# Patient Record
Sex: Male | Born: 1960 | Race: White | Hispanic: No | Smoking: Former smoker
Health system: Southern US, Community
[De-identification: ages and names within clinical notes are randomized; demographics above are authoritative.]

## PROBLEM LIST (undated history)

## (undated) ENCOUNTER — Emergency Department: Admission: EM | Payer: Medicare Other | Source: Home / Self Care

## (undated) DIAGNOSIS — M1990 Unspecified osteoarthritis, unspecified site: Secondary | ICD-10-CM

## (undated) DIAGNOSIS — K219 Gastro-esophageal reflux disease without esophagitis: Secondary | ICD-10-CM

## (undated) DIAGNOSIS — G839 Paralytic syndrome, unspecified: Secondary | ICD-10-CM

## (undated) DIAGNOSIS — F32A Depression, unspecified: Secondary | ICD-10-CM

## (undated) DIAGNOSIS — A4902 Methicillin resistant Staphylococcus aureus infection, unspecified site: Secondary | ICD-10-CM

## (undated) DIAGNOSIS — D649 Anemia, unspecified: Secondary | ICD-10-CM

## (undated) DIAGNOSIS — G8220 Paraplegia, unspecified: Secondary | ICD-10-CM

## (undated) DIAGNOSIS — M869 Osteomyelitis, unspecified: Secondary | ICD-10-CM

## (undated) DIAGNOSIS — L89319 Pressure ulcer of right buttock, unspecified stage: Secondary | ICD-10-CM

## (undated) DIAGNOSIS — G709 Myoneural disorder, unspecified: Secondary | ICD-10-CM

## (undated) DIAGNOSIS — Z5189 Encounter for other specified aftercare: Secondary | ICD-10-CM

## (undated) DIAGNOSIS — F329 Major depressive disorder, single episode, unspecified: Secondary | ICD-10-CM

## (undated) DIAGNOSIS — F419 Anxiety disorder, unspecified: Secondary | ICD-10-CM

## (undated) DIAGNOSIS — B1920 Unspecified viral hepatitis C without hepatic coma: Secondary | ICD-10-CM

## (undated) DIAGNOSIS — F1910 Other psychoactive substance abuse, uncomplicated: Secondary | ICD-10-CM

## (undated) HISTORY — DX: Unspecified osteoarthritis, unspecified site: M19.90

## (undated) HISTORY — DX: Anemia, unspecified: D64.9

## (undated) HISTORY — DX: Paraplegia, unspecified: G82.20

## (undated) HISTORY — PX: HERNIA REPAIR: SHX51

## (undated) HISTORY — DX: Depression, unspecified: F32.A

## (undated) HISTORY — DX: Major depressive disorder, single episode, unspecified: F32.9

## (undated) HISTORY — PX: BACK SURGERY: SHX140

## (undated) HISTORY — PX: FRACTURE SURGERY: SHX138

## (undated) HISTORY — PX: CHOLECYSTECTOMY: SHX55

## (undated) HISTORY — DX: Other psychoactive substance abuse, uncomplicated: F19.10

## (undated) HISTORY — DX: Myoneural disorder, unspecified: G70.9

## (undated) HISTORY — DX: Encounter for other specified aftercare: Z51.89

## (undated) HISTORY — DX: Anxiety disorder, unspecified: F41.9

---

## 2003-12-12 ENCOUNTER — Emergency Department (HOSPITAL_COMMUNITY): Admission: EM | Admit: 2003-12-12 | Discharge: 2003-12-12 | Payer: Self-pay | Admitting: Emergency Medicine

## 2003-12-23 ENCOUNTER — Encounter: Payer: Self-pay | Admitting: Family Medicine

## 2004-01-07 ENCOUNTER — Encounter: Payer: Self-pay | Admitting: Family Medicine

## 2004-02-08 ENCOUNTER — Emergency Department (HOSPITAL_COMMUNITY): Admission: EM | Admit: 2004-02-08 | Discharge: 2004-02-08 | Payer: Self-pay | Admitting: Emergency Medicine

## 2004-02-18 ENCOUNTER — Encounter (HOSPITAL_COMMUNITY): Admission: RE | Admit: 2004-02-18 | Discharge: 2004-03-19 | Payer: Self-pay | Admitting: Occupational Therapy

## 2004-02-24 ENCOUNTER — Ambulatory Visit: Payer: Self-pay | Admitting: Pain Medicine

## 2004-04-20 ENCOUNTER — Emergency Department (HOSPITAL_COMMUNITY): Admission: EM | Admit: 2004-04-20 | Discharge: 2004-04-20 | Payer: Self-pay | Admitting: Emergency Medicine

## 2004-05-28 ENCOUNTER — Ambulatory Visit: Payer: Self-pay | Admitting: Pain Medicine

## 2004-07-01 ENCOUNTER — Ambulatory Visit: Payer: Self-pay | Admitting: Pain Medicine

## 2006-11-18 ENCOUNTER — Ambulatory Visit: Admission: RE | Admit: 2006-11-18 | Discharge: 2006-11-18 | Payer: Self-pay | Admitting: Family Medicine

## 2007-03-18 ENCOUNTER — Emergency Department (HOSPITAL_COMMUNITY): Admission: EM | Admit: 2007-03-18 | Discharge: 2007-03-18 | Payer: Self-pay | Admitting: Emergency Medicine

## 2007-03-18 ENCOUNTER — Encounter: Payer: Self-pay | Admitting: Orthopedic Surgery

## 2007-03-22 ENCOUNTER — Ambulatory Visit: Payer: Self-pay | Admitting: Orthopedic Surgery

## 2007-03-22 DIAGNOSIS — S82209A Unspecified fracture of shaft of unspecified tibia, initial encounter for closed fracture: Secondary | ICD-10-CM | POA: Insufficient documentation

## 2007-04-24 ENCOUNTER — Ambulatory Visit: Payer: Self-pay | Admitting: Orthopedic Surgery

## 2007-06-05 ENCOUNTER — Ambulatory Visit: Payer: Self-pay | Admitting: Orthopedic Surgery

## 2007-07-18 ENCOUNTER — Encounter: Payer: Self-pay | Admitting: Orthopedic Surgery

## 2007-07-18 ENCOUNTER — Emergency Department (HOSPITAL_COMMUNITY): Admission: EM | Admit: 2007-07-18 | Discharge: 2007-07-18 | Payer: Self-pay | Admitting: Emergency Medicine

## 2007-08-02 ENCOUNTER — Ambulatory Visit: Payer: Self-pay | Admitting: Orthopedic Surgery

## 2007-09-08 ENCOUNTER — Emergency Department (HOSPITAL_COMMUNITY): Admission: EM | Admit: 2007-09-08 | Discharge: 2007-09-08 | Payer: Self-pay | Admitting: Emergency Medicine

## 2007-09-27 ENCOUNTER — Emergency Department (HOSPITAL_COMMUNITY): Admission: EM | Admit: 2007-09-27 | Discharge: 2007-09-28 | Payer: Self-pay | Admitting: Emergency Medicine

## 2007-10-04 ENCOUNTER — Ambulatory Visit: Payer: Self-pay | Admitting: Orthopedic Surgery

## 2007-12-28 ENCOUNTER — Ambulatory Visit (HOSPITAL_COMMUNITY): Admission: RE | Admit: 2007-12-28 | Discharge: 2007-12-28 | Payer: Self-pay | Admitting: Family Medicine

## 2008-01-29 ENCOUNTER — Ambulatory Visit: Payer: Self-pay | Admitting: Orthopedic Surgery

## 2008-03-02 ENCOUNTER — Inpatient Hospital Stay (HOSPITAL_COMMUNITY): Admission: EM | Admit: 2008-03-02 | Discharge: 2008-03-12 | Payer: Self-pay | Admitting: Emergency Medicine

## 2008-03-04 ENCOUNTER — Ambulatory Visit: Payer: Self-pay | Admitting: Internal Medicine

## 2008-03-05 ENCOUNTER — Encounter (INDEPENDENT_AMBULATORY_CARE_PROVIDER_SITE_OTHER): Payer: Self-pay | Admitting: General Surgery

## 2008-03-06 ENCOUNTER — Ambulatory Visit: Payer: Self-pay | Admitting: Internal Medicine

## 2008-03-07 ENCOUNTER — Ambulatory Visit: Payer: Self-pay | Admitting: Internal Medicine

## 2008-03-08 HISTORY — PX: GALLBLADDER SURGERY: SHX652

## 2008-05-01 ENCOUNTER — Ambulatory Visit: Payer: Self-pay | Admitting: Orthopedic Surgery

## 2008-05-03 ENCOUNTER — Ambulatory Visit (HOSPITAL_COMMUNITY): Admission: RE | Admit: 2008-05-03 | Discharge: 2008-05-03 | Payer: Self-pay | Admitting: General Surgery

## 2008-05-07 ENCOUNTER — Ambulatory Visit (HOSPITAL_COMMUNITY): Admission: RE | Admit: 2008-05-07 | Discharge: 2008-05-07 | Payer: Self-pay | Admitting: General Surgery

## 2008-05-16 ENCOUNTER — Ambulatory Visit: Payer: Self-pay | Admitting: Internal Medicine

## 2008-05-17 ENCOUNTER — Encounter: Payer: Self-pay | Admitting: Internal Medicine

## 2008-05-17 ENCOUNTER — Encounter: Payer: Self-pay | Admitting: Urgent Care

## 2008-05-21 ENCOUNTER — Encounter (HOSPITAL_COMMUNITY): Admission: RE | Admit: 2008-05-21 | Discharge: 2008-06-20 | Payer: Self-pay | Admitting: Internal Medicine

## 2008-05-22 ENCOUNTER — Encounter: Payer: Self-pay | Admitting: Internal Medicine

## 2008-05-22 DIAGNOSIS — K8050 Calculus of bile duct without cholangitis or cholecystitis without obstruction: Secondary | ICD-10-CM | POA: Insufficient documentation

## 2008-05-30 ENCOUNTER — Ambulatory Visit (HOSPITAL_COMMUNITY): Admission: RE | Admit: 2008-05-30 | Discharge: 2008-05-30 | Payer: Self-pay | Admitting: Internal Medicine

## 2008-05-30 ENCOUNTER — Ambulatory Visit: Payer: Self-pay | Admitting: Internal Medicine

## 2008-06-15 ENCOUNTER — Emergency Department (HOSPITAL_COMMUNITY): Admission: EM | Admit: 2008-06-15 | Discharge: 2008-06-15 | Payer: Self-pay | Admitting: Emergency Medicine

## 2008-06-18 ENCOUNTER — Emergency Department (HOSPITAL_COMMUNITY): Admission: EM | Admit: 2008-06-18 | Discharge: 2008-06-18 | Payer: Self-pay | Admitting: Emergency Medicine

## 2008-06-25 ENCOUNTER — Ambulatory Visit (HOSPITAL_COMMUNITY): Admission: RE | Admit: 2008-06-25 | Discharge: 2008-06-25 | Payer: Self-pay | Admitting: Family Medicine

## 2008-08-18 ENCOUNTER — Inpatient Hospital Stay (HOSPITAL_COMMUNITY): Admission: EM | Admit: 2008-08-18 | Discharge: 2008-08-20 | Payer: Self-pay | Admitting: Emergency Medicine

## 2008-12-28 ENCOUNTER — Ambulatory Visit (HOSPITAL_COMMUNITY): Admission: RE | Admit: 2008-12-28 | Discharge: 2008-12-28 | Payer: Self-pay | Admitting: Family Medicine

## 2009-01-30 ENCOUNTER — Inpatient Hospital Stay (HOSPITAL_COMMUNITY): Admission: EM | Admit: 2009-01-30 | Discharge: 2009-02-05 | Payer: Self-pay | Admitting: Emergency Medicine

## 2009-09-01 ENCOUNTER — Inpatient Hospital Stay (HOSPITAL_COMMUNITY): Admission: EM | Admit: 2009-09-01 | Discharge: 2009-09-05 | Payer: Self-pay | Admitting: Emergency Medicine

## 2010-03-29 ENCOUNTER — Encounter: Payer: Self-pay | Admitting: Family Medicine

## 2010-03-29 ENCOUNTER — Encounter: Payer: Self-pay | Admitting: Internal Medicine

## 2010-05-07 HISTORY — PX: FEMUR FRACTURE SURGERY: SHX633

## 2010-05-18 ENCOUNTER — Emergency Department (HOSPITAL_COMMUNITY)
Admission: EM | Admit: 2010-05-18 | Discharge: 2010-05-18 | Disposition: A | Discharge status: Home / Self Care | Payer: Medicare Other | Attending: Emergency Medicine | Admitting: Emergency Medicine

## 2010-05-18 DIAGNOSIS — G5790 Unspecified mononeuropathy of unspecified lower limb: Secondary | ICD-10-CM | POA: Insufficient documentation

## 2010-05-18 DIAGNOSIS — Z760 Encounter for issue of repeat prescription: Secondary | ICD-10-CM | POA: Insufficient documentation

## 2010-05-18 DIAGNOSIS — G8929 Other chronic pain: Secondary | ICD-10-CM | POA: Insufficient documentation

## 2010-05-24 LAB — BASIC METABOLIC PANEL
BUN: 5 mg/dL — ABNORMAL LOW (ref 6–23)
Calcium: 8.1 mg/dL — ABNORMAL LOW (ref 8.4–10.5)
Calcium: 8.1 mg/dL — ABNORMAL LOW (ref 8.4–10.5)
Calcium: 8.3 mg/dL — ABNORMAL LOW (ref 8.4–10.5)
Chloride: 107 mEq/L (ref 96–112)
Creatinine, Ser: 0.64 mg/dL (ref 0.4–1.5)
Creatinine, Ser: 0.91 mg/dL (ref 0.4–1.5)
GFR calc Af Amer: >60 mL/min (ref >60)
GFR calc non Af Amer: >60 mL/min (ref >60)
GFR calc non Af Amer: >60 mL/min (ref >60)
GFR calc non Af Amer: >60 mL/min (ref >60)
Glucose, Bld: 104 mg/dL — ABNORMAL HIGH (ref 70–99)
Sodium: 138 mEq/L (ref 135–145)

## 2010-05-24 LAB — CBC
Hemoglobin: 8.5 g/dL — ABNORMAL LOW (ref 13.0–17.0)
MCHC: 32.2 g/dL (ref 30.0–36.0)
Platelets: 236 10*3/uL (ref 150–400)
Platelets: 229 10*3/uL (ref 150–400)
Platelets: 351 10*3/uL (ref 150–400)
RBC: 3.7 MIL/uL — ABNORMAL LOW (ref 4.22–5.81)
RBC: 4.2 MIL/uL — ABNORMAL LOW (ref 4.22–5.81)
RDW: 17.8 % — ABNORMAL HIGH (ref 11.5–15.5)
RDW: 17.8 % — ABNORMAL HIGH (ref 11.5–15.5)
RDW: 18.5 % — ABNORMAL HIGH (ref 11.5–15.5)
WBC: 5.3 10*3/uL (ref 4.0–10.5)
WBC: 13.4 10*3/uL — ABNORMAL HIGH (ref 4.0–10.5)
WBC: 7.7 10*3/uL (ref 4.0–10.5)
WBC: 8.1 10*3/uL (ref 4.0–10.5)

## 2010-05-24 LAB — DIFFERENTIAL
Basophils Absolute: 0 10*3/uL (ref 0.0–0.1)
Basophils Absolute: 0 10*3/uL (ref 0.0–0.1)
Basophils Relative: 0 % (ref 0–1)
Eosinophils Absolute: 0.1 10*3/uL (ref 0.0–0.7)
Eosinophils Relative: 8 % — ABNORMAL HIGH (ref 0–5)
Eosinophils Relative: 1 % (ref 0–5)
Lymphocytes Relative: 34 % (ref 12–46)
Lymphs Abs: 0.4 10*3/uL — ABNORMAL LOW (ref 0.7–4.0)
Monocytes Absolute: 0.4 10*3/uL (ref 0.1–1.0)
Monocytes Relative: 3 % (ref 3–12)
Monocytes Relative: 6 % (ref 3–12)
Neutro Abs: 2.6 10*3/uL (ref 1.7–7.7)
Neutro Abs: 5.8 10*3/uL (ref 1.7–7.7)
Neutrophils Relative %: 49 % (ref 43–77)
Neutrophils Relative %: 75 % (ref 43–77)
Neutrophils Relative %: 92 % — ABNORMAL HIGH (ref 43–77)

## 2010-05-24 LAB — URINALYSIS, ROUTINE W REFLEX MICROSCOPIC
Ketones, ur: NEGATIVE mg/dL
Nitrite: POSITIVE — AB
Specific Gravity, Urine: 1.02 (ref 1.005–1.030)
pH: 6 (ref 5.0–8.0)

## 2010-05-24 LAB — URINE CULTURE: Colony Count: >=100000

## 2010-05-24 LAB — COMPREHENSIVE METABOLIC PANEL
ALT: 19 U/L (ref 0–53)
AST: 29 U/L (ref 0–37)
Albumin: 2 g/dL — ABNORMAL LOW (ref 3.5–5.2)
Alkaline Phosphatase: 139 U/L — ABNORMAL HIGH (ref 39–117)
Chloride: 110 mEq/L (ref 96–112)
GFR calc Af Amer: >60 mL/min (ref >60)
Potassium: 4.2 mEq/L (ref 3.5–5.1)
Sodium: 137 mEq/L (ref 135–145)
Total Protein: 6.1 g/dL (ref 6.0–8.3)

## 2010-05-24 LAB — MAGNESIUM: Magnesium: 1.4 mg/dL — ABNORMAL LOW (ref 1.5–2.5)

## 2010-05-24 LAB — AMYLASE: Amylase: 56 U/L (ref 0–105)

## 2010-05-24 LAB — URINE MICROSCOPIC-ADD ON

## 2010-05-24 LAB — LACTIC ACID, PLASMA: Lactic Acid, Venous: 1.6 mmol/L (ref 0.5–2.2)

## 2010-05-24 LAB — MRSA PCR SCREENING: MRSA by PCR: POSITIVE — AB

## 2010-05-24 LAB — LIPASE, BLOOD: Lipase: 16 U/L (ref 11–59)

## 2010-05-24 LAB — CULTURE, BLOOD (ROUTINE X 2)

## 2010-05-24 LAB — TSH: TSH: 5.604 u[IU]/mL — ABNORMAL HIGH (ref 0.350–4.500)

## 2010-05-24 LAB — PROTIME-INR: INR: 1.51 — ABNORMAL HIGH (ref 0.00–1.49)

## 2010-05-24 LAB — APTT: aPTT: 37 seconds (ref 24–37)

## 2010-06-04 ENCOUNTER — Emergency Department (HOSPITAL_COMMUNITY): Payer: Medicare Other

## 2010-06-04 ENCOUNTER — Inpatient Hospital Stay (HOSPITAL_COMMUNITY)
Admission: EM | Admit: 2010-06-04 | Discharge: 2010-06-09 | DRG: 480 | Disposition: A | Discharge status: Home Health | Payer: Medicare Other | Attending: Orthopedic Surgery | Admitting: Orthopedic Surgery

## 2010-06-04 DIAGNOSIS — W050XXA Fall from non-moving wheelchair, initial encounter: Secondary | ICD-10-CM | POA: Diagnosis present

## 2010-06-04 DIAGNOSIS — D62 Acute posthemorrhagic anemia: Secondary | ICD-10-CM | POA: Diagnosis not present

## 2010-06-04 DIAGNOSIS — L8994 Pressure ulcer of unspecified site, stage 4: Secondary | ICD-10-CM | POA: Diagnosis present

## 2010-06-04 DIAGNOSIS — G8929 Other chronic pain: Secondary | ICD-10-CM | POA: Diagnosis present

## 2010-06-04 DIAGNOSIS — L89109 Pressure ulcer of unspecified part of back, unspecified stage: Secondary | ICD-10-CM | POA: Diagnosis present

## 2010-06-04 DIAGNOSIS — A498 Other bacterial infections of unspecified site: Secondary | ICD-10-CM | POA: Diagnosis present

## 2010-06-04 DIAGNOSIS — G8220 Paraplegia, unspecified: Secondary | ICD-10-CM | POA: Diagnosis present

## 2010-06-04 DIAGNOSIS — S72309A Unspecified fracture of shaft of unspecified femur, initial encounter for closed fracture: Secondary | ICD-10-CM

## 2010-06-04 DIAGNOSIS — N390 Urinary tract infection, site not specified: Secondary | ICD-10-CM | POA: Diagnosis present

## 2010-06-04 DIAGNOSIS — S72443A Displaced fracture of lower epiphysis (separation) of unspecified femur, initial encounter for closed fracture: Principal | ICD-10-CM | POA: Diagnosis present

## 2010-06-04 LAB — DIFFERENTIAL
Basophils Absolute: 0 10*3/uL (ref 0.0–0.1)
Basophils Relative: 0 % (ref 0–1)
Neutro Abs: 10.5 10*3/uL — ABNORMAL HIGH (ref 1.7–7.7)
Neutrophils Relative %: 78 % — ABNORMAL HIGH (ref 43–77)

## 2010-06-04 LAB — BASIC METABOLIC PANEL
Chloride: 104 mEq/L (ref 96–112)
Creatinine, Ser: 0.67 mg/dL (ref 0.4–1.5)
GFR calc Af Amer: >60 mL/min (ref >60)
Potassium: 3.5 mEq/L (ref 3.5–5.1)

## 2010-06-04 LAB — PROTIME-INR
INR: 1.01 (ref 0.00–1.49)
Prothrombin Time: 13.5 seconds (ref 11.6–15.2)

## 2010-06-04 LAB — URINALYSIS, ROUTINE W REFLEX MICROSCOPIC
Glucose, UA: NEGATIVE mg/dL
Protein, ur: NEGATIVE mg/dL
pH: 5 (ref 5.0–8.0)

## 2010-06-04 LAB — CBC
Hemoglobin: 12.7 g/dL — ABNORMAL LOW (ref 13.0–17.0)
RBC: 4.81 MIL/uL (ref 4.22–5.81)

## 2010-06-05 ENCOUNTER — Inpatient Hospital Stay (HOSPITAL_COMMUNITY): Payer: Medicare Other

## 2010-06-05 LAB — SURGICAL PCR SCREEN: Staphylococcus aureus: NEGATIVE

## 2010-06-06 LAB — DIFFERENTIAL
Basophils Absolute: 0 10*3/uL (ref 0.0–0.1)
Eosinophils Absolute: 0 10*3/uL (ref 0.0–0.7)
Eosinophils Relative: 1 % (ref 0–5)
Monocytes Absolute: 1.2 10*3/uL — ABNORMAL HIGH (ref 0.1–1.0)

## 2010-06-06 LAB — BASIC METABOLIC PANEL
BUN: 5 mg/dL — ABNORMAL LOW (ref 6–23)
CO2: 27 mEq/L (ref 19–32)
Calcium: 7.7 mg/dL — ABNORMAL LOW (ref 8.4–10.5)
GFR calc non Af Amer: >60 mL/min (ref >60)
Glucose, Bld: 133 mg/dL — ABNORMAL HIGH (ref 70–99)

## 2010-06-06 LAB — CBC
HCT: 23 % — ABNORMAL LOW (ref 39.0–52.0)
MCHC: 32.2 g/dL (ref 30.0–36.0)
MCV: 80.7 fL (ref 78.0–100.0)
Platelets: 222 10*3/uL (ref 150–400)
RDW: 17.3 % — ABNORMAL HIGH (ref 11.5–15.5)
WBC: 7.5 10*3/uL (ref 4.0–10.5)

## 2010-06-07 LAB — DIFFERENTIAL
Basophils Absolute: 0 10*3/uL (ref 0.0–0.1)
Eosinophils Relative: 1 % (ref 0–5)
Lymphocytes Relative: 24 % (ref 12–46)
Lymphs Abs: 2.1 10*3/uL (ref 0.7–4.0)
Monocytes Relative: 15 % — ABNORMAL HIGH (ref 3–12)

## 2010-06-07 LAB — BASIC METABOLIC PANEL
CO2: 26 mEq/L (ref 19–32)
Calcium: 7.4 mg/dL — ABNORMAL LOW (ref 8.4–10.5)
Chloride: 104 mEq/L (ref 96–112)
GFR calc Af Amer: >60 mL/min (ref >60)
Glucose, Bld: 111 mg/dL — ABNORMAL HIGH (ref 70–99)
Potassium: 3 mEq/L — ABNORMAL LOW (ref 3.5–5.1)
Sodium: 135 mEq/L (ref 135–145)

## 2010-06-07 LAB — CBC
HCT: 20 % — ABNORMAL LOW (ref 39.0–52.0)
Hemoglobin: 6.4 g/dL — CL (ref 13.0–17.0)
RBC: 2.46 MIL/uL — ABNORMAL LOW (ref 4.22–5.81)
WBC: 8.6 10*3/uL (ref 4.0–10.5)

## 2010-06-07 LAB — URINE CULTURE: Culture  Setup Time: 201203302017

## 2010-06-08 LAB — BASIC METABOLIC PANEL
BUN: 4 mg/dL — ABNORMAL LOW (ref 6–23)
CO2: 26 mEq/L (ref 19–32)
Calcium: 7.8 mg/dL — ABNORMAL LOW (ref 8.4–10.5)
Glucose, Bld: 107 mg/dL — ABNORMAL HIGH (ref 70–99)
Sodium: 137 mEq/L (ref 135–145)

## 2010-06-08 LAB — CBC
HCT: 26.2 % — ABNORMAL LOW (ref 39.0–52.0)
Hemoglobin: 8.7 g/dL — ABNORMAL LOW (ref 13.0–17.0)
MCHC: 33.2 g/dL (ref 30.0–36.0)
MCV: 80.4 fL (ref 78.0–100.0)

## 2010-06-08 LAB — DIFFERENTIAL
Basophils Absolute: 0 10*3/uL (ref 0.0–0.1)
Eosinophils Relative: 2 % (ref 0–5)
Lymphocytes Relative: 24 % (ref 12–46)
Lymphs Abs: 1.6 10*3/uL (ref 0.7–4.0)
Monocytes Absolute: 0.9 10*3/uL (ref 0.1–1.0)
Monocytes Relative: 14 % — ABNORMAL HIGH (ref 3–12)
Neutro Abs: 3.9 10*3/uL (ref 1.7–7.7)

## 2010-06-08 LAB — CROSSMATCH
Antibody Screen: NEGATIVE
Unit division: 0

## 2010-06-09 LAB — BUN: BUN: 5 mg/dL — ABNORMAL LOW (ref 6–23)

## 2010-06-09 LAB — POTASSIUM: Potassium: 4 mEq/L (ref 3.5–5.1)

## 2010-06-09 LAB — CREATININE, SERUM
GFR calc Af Amer: >60 mL/min (ref >60)
GFR calc non Af Amer: >60 mL/min (ref >60)

## 2010-06-10 LAB — DIFFERENTIAL
Basophils Absolute: 0 10*3/uL (ref 0.0–0.1)
Basophils Absolute: 0 10*3/uL (ref 0.0–0.1)
Basophils Absolute: 0.1 10*3/uL (ref 0.0–0.1)
Basophils Relative: 0 % (ref 0–1)
Basophils Relative: 0 % (ref 0–1)
Basophils Relative: 1 % (ref 0–1)
Basophils Relative: 1 % (ref 0–1)
Eosinophils Absolute: 0.1 10*3/uL (ref 0.0–0.7)
Eosinophils Absolute: 0.2 10*3/uL (ref 0.0–0.7)
Eosinophils Absolute: 0.3 10*3/uL (ref 0.0–0.7)
Eosinophils Relative: 1 % (ref 0–5)
Eosinophils Relative: 2 % (ref 0–5)
Lymphocytes Relative: 32 % (ref 12–46)
Lymphs Abs: 1.5 10*3/uL (ref 0.7–4.0)
Lymphs Abs: 2.1 10*3/uL (ref 0.7–4.0)
Monocytes Absolute: 0.7 10*3/uL (ref 0.1–1.0)
Neutro Abs: 3.8 10*3/uL (ref 1.7–7.7)
Neutrophils Relative %: 55 % (ref 43–77)
Neutrophils Relative %: 71 % (ref 43–77)
Neutrophils Relative %: 72 % (ref 43–77)

## 2010-06-10 LAB — VITAMIN B12: Vitamin B-12: 380 pg/mL (ref 211–911)

## 2010-06-10 LAB — CBC
HCT: 24.9 % — ABNORMAL LOW (ref 39.0–52.0)
HCT: 29.1 % — ABNORMAL LOW (ref 39.0–52.0)
Hemoglobin: 10.4 g/dL — ABNORMAL LOW (ref 13.0–17.0)
MCHC: 32.4 g/dL (ref 30.0–36.0)
MCHC: 32.8 g/dL (ref 30.0–36.0)
MCHC: 33 g/dL (ref 30.0–36.0)
MCV: 77.5 fL — ABNORMAL LOW (ref 78.0–100.0)
MCV: 79.2 fL (ref 78.0–100.0)
MCV: 80.4 fL (ref 78.0–100.0)
Platelets: 472 10*3/uL — ABNORMAL HIGH (ref 150–400)
Platelets: 476 10*3/uL — ABNORMAL HIGH (ref 150–400)
Platelets: 555 10*3/uL — ABNORMAL HIGH (ref 150–400)
RBC: 3.94 MIL/uL — ABNORMAL LOW (ref 4.22–5.81)
RDW: 18.6 % — ABNORMAL HIGH (ref 11.5–15.5)
RDW: 19.2 % — ABNORMAL HIGH (ref 11.5–15.5)
RDW: 19.4 % — ABNORMAL HIGH (ref 11.5–15.5)
RDW: 20.1 % — ABNORMAL HIGH (ref 11.5–15.5)
WBC: 12.2 10*3/uL — ABNORMAL HIGH (ref 4.0–10.5)

## 2010-06-10 LAB — BASIC METABOLIC PANEL
BUN: 4 mg/dL — ABNORMAL LOW (ref 6–23)
BUN: 8 mg/dL (ref 6–23)
CO2: 30 mEq/L (ref 19–32)
Calcium: 8.3 mg/dL — ABNORMAL LOW (ref 8.4–10.5)
Chloride: 105 mEq/L (ref 96–112)
Chloride: 99 mEq/L (ref 96–112)
Creatinine, Ser: 0.57 mg/dL (ref 0.4–1.5)
Creatinine, Ser: 0.62 mg/dL (ref 0.4–1.5)
GFR calc Af Amer: >60 mL/min (ref >60)
GFR calc non Af Amer: >60 mL/min (ref >60)
Glucose, Bld: 105 mg/dL — ABNORMAL HIGH (ref 70–99)
Glucose, Bld: 80 mg/dL (ref 70–99)
Potassium: 3.9 mEq/L (ref 3.5–5.1)
Sodium: 139 mEq/L (ref 135–145)

## 2010-06-10 LAB — URINALYSIS, ROUTINE W REFLEX MICROSCOPIC
Nitrite: NEGATIVE
Specific Gravity, Urine: 1.03 (ref 1.005–1.030)
Urobilinogen, UA: 2 mg/dL — ABNORMAL HIGH (ref 0.0–1.0)

## 2010-06-10 LAB — URINE CULTURE
Colony Count: NO GROWTH
Special Requests: NEGATIVE

## 2010-06-10 LAB — CROSSMATCH

## 2010-06-10 LAB — RETICULOCYTES
RBC.: 3.6 MIL/uL — ABNORMAL LOW (ref 4.22–5.81)
Retic Count, Absolute: 86.4 10*3/uL (ref 19.0–186.0)
Retic Ct Pct: 2.4 % (ref 0.4–3.1)

## 2010-06-10 LAB — IRON AND TIBC
Iron: 52 ug/dL (ref 42–135)
UIBC: 79 ug/dL

## 2010-06-15 LAB — CBC
HCT: 36.3 % — ABNORMAL LOW (ref 39.0–52.0)
Hemoglobin: 12.7 g/dL — ABNORMAL LOW (ref 13.0–17.0)
MCHC: 35 g/dL (ref 30.0–36.0)
MCV: 87.4 fL (ref 78.0–100.0)
Platelets: 252 10*3/uL (ref 150–400)
RDW: 15.2 % (ref 11.5–15.5)

## 2010-06-15 LAB — BASIC METABOLIC PANEL
BUN: 4 mg/dL — ABNORMAL LOW (ref 6–23)
BUN: 6 mg/dL (ref 6–23)
BUN: 8 mg/dL (ref 6–23)
CO2: 26 mEq/L (ref 19–32)
CO2: 28 mEq/L (ref 19–32)
Calcium: 7.9 mg/dL — ABNORMAL LOW (ref 8.4–10.5)
Chloride: 105 mEq/L (ref 96–112)
Chloride: 95 mEq/L — ABNORMAL LOW (ref 96–112)
Creatinine, Ser: 0.63 mg/dL (ref 0.4–1.5)
Creatinine, Ser: 0.75 mg/dL (ref 0.4–1.5)
GFR calc non Af Amer: >60 mL/min (ref >60)
Glucose, Bld: 116 mg/dL — ABNORMAL HIGH (ref 70–99)
Glucose, Bld: 95 mg/dL (ref 70–99)
Potassium: 2.6 mEq/L — CL (ref 3.5–5.1)
Potassium: 3.4 mEq/L — ABNORMAL LOW (ref 3.5–5.1)
Sodium: 132 mEq/L — ABNORMAL LOW (ref 135–145)

## 2010-06-15 LAB — DIFFERENTIAL
Band Neutrophils: 1 % (ref 0–10)
Blasts: 0 %
Metamyelocytes Relative: 0 %
Monocytes Absolute: 2.6 10*3/uL — ABNORMAL HIGH (ref 0.1–1.0)
Monocytes Relative: 16 % — ABNORMAL HIGH (ref 3–12)
Myelocytes: 0 %
Promyelocytes Absolute: 0 %
nRBC: 0 /100 WBC

## 2010-06-15 LAB — WOUND CULTURE

## 2010-06-15 LAB — CULTURE, BLOOD (ROUTINE X 2)
Culture: NO GROWTH
Report Status: 6182010

## 2010-06-16 ENCOUNTER — Ambulatory Visit: Payer: Medicare Other | Admitting: Orthopedic Surgery

## 2010-06-17 LAB — DIFFERENTIAL
Basophils Absolute: 0 10*3/uL (ref 0.0–0.1)
Basophils Relative: 0 % (ref 0–1)
Eosinophils Absolute: 0.1 10*3/uL (ref 0.0–0.7)
Neutrophils Relative %: 76 % (ref 43–77)

## 2010-06-17 LAB — CBC
MCHC: 34.6 g/dL (ref 30.0–36.0)
MCV: 84.7 fL (ref 78.0–100.0)
Platelets: 233 10*3/uL (ref 150–400)
WBC: 9.1 10*3/uL (ref 4.0–10.5)

## 2010-06-17 LAB — WOUND CULTURE: Gram Stain: NONE SEEN

## 2010-06-18 LAB — CBC
HCT: 41 % (ref 39.0–52.0)
MCHC: 34 g/dL (ref 30.0–36.0)
MCV: 84.8 fL (ref 78.0–100.0)
Platelets: 258 10*3/uL (ref 150–400)
RDW: 15.4 % (ref 11.5–15.5)
WBC: 5.7 10*3/uL (ref 4.0–10.5)

## 2010-06-18 LAB — BASIC METABOLIC PANEL
BUN: 7 mg/dL (ref 6–23)
CO2: 28 mEq/L (ref 19–32)
Chloride: 101 mEq/L (ref 96–112)
Glucose, Bld: 101 mg/dL — ABNORMAL HIGH (ref 70–99)
Potassium: 4.1 mEq/L (ref 3.5–5.1)

## 2010-06-18 LAB — HEPATIC FUNCTION PANEL
ALT: 62 U/L — ABNORMAL HIGH (ref 0–53)
Bilirubin, Direct: 0.3 mg/dL (ref 0.0–0.3)
Indirect Bilirubin: 0.2 mg/dL — ABNORMAL LOW (ref 0.3–0.9)

## 2010-06-19 ENCOUNTER — Encounter: Payer: Self-pay | Admitting: Orthopedic Surgery

## 2010-06-22 ENCOUNTER — Ambulatory Visit (INDEPENDENT_AMBULATORY_CARE_PROVIDER_SITE_OTHER): Payer: Medicare Other | Admitting: Orthopedic Surgery

## 2010-06-22 ENCOUNTER — Ambulatory Visit (HOSPITAL_COMMUNITY): Admission: RE | Admit: 2010-06-22 | Payer: Medicare Other | Source: Ambulatory Visit

## 2010-06-22 DIAGNOSIS — S7290XA Unspecified fracture of unspecified femur, initial encounter for closed fracture: Secondary | ICD-10-CM

## 2010-06-22 DIAGNOSIS — S7292XA Unspecified fracture of left femur, initial encounter for closed fracture: Secondary | ICD-10-CM

## 2010-06-22 LAB — HEPATIC FUNCTION PANEL
ALT: 25 U/L (ref 0–53)
AST: 30 U/L (ref 0–37)
Bilirubin, Direct: 1.4 mg/dL — ABNORMAL HIGH (ref 0.0–0.3)
Indirect Bilirubin: 1.4 mg/dL — ABNORMAL HIGH (ref 0.3–0.9)
Indirect Bilirubin: 1.4 mg/dL — ABNORMAL HIGH (ref 0.3–0.9)
Total Protein: 6.1 g/dL (ref 6.0–8.3)
Total Protein: 6.9 g/dL (ref 6.0–8.3)

## 2010-06-22 LAB — CBC
HCT: 26.7 % — ABNORMAL LOW (ref 39.0–52.0)
HCT: 31.6 % — ABNORMAL LOW (ref 39.0–52.0)
Hemoglobin: 8.8 g/dL — ABNORMAL LOW (ref 13.0–17.0)
MCHC: 33 g/dL (ref 30.0–36.0)
MCHC: 33.7 g/dL (ref 30.0–36.0)
MCV: 85.7 fL (ref 78.0–100.0)
MCV: 86 fL (ref 78.0–100.0)
MCV: 87.5 fL (ref 78.0–100.0)
MCV: 89.2 fL (ref 78.0–100.0)
Platelets: 408 10*3/uL — ABNORMAL HIGH (ref 150–400)
Platelets: 533 10*3/uL — ABNORMAL HIGH (ref 150–400)
Platelets: 687 10*3/uL — ABNORMAL HIGH (ref 150–400)
RBC: 3.11 MIL/uL — ABNORMAL LOW (ref 4.22–5.81)
RBC: 3.16 MIL/uL — ABNORMAL LOW (ref 4.22–5.81)
RDW: 14.3 % (ref 11.5–15.5)
RDW: 14.5 % (ref 11.5–15.5)
RDW: 14.7 % (ref 11.5–15.5)
WBC: 9.1 10*3/uL (ref 4.0–10.5)

## 2010-06-22 LAB — DIFFERENTIAL
Basophils Absolute: 0 10*3/uL (ref 0.0–0.1)
Basophils Absolute: 0 10*3/uL (ref 0.0–0.1)
Basophils Absolute: 0 10*3/uL (ref 0.0–0.1)
Basophils Absolute: 0 10*3/uL (ref 0.0–0.1)
Basophils Relative: 0 % (ref 0–1)
Basophils Relative: 0 % (ref 0–1)
Basophils Relative: 1 % (ref 0–1)
Eosinophils Absolute: 0.1 10*3/uL (ref 0.0–0.7)
Eosinophils Absolute: 0.1 10*3/uL (ref 0.0–0.7)
Eosinophils Absolute: 0.2 10*3/uL (ref 0.0–0.7)
Eosinophils Relative: 2 % (ref 0–5)
Eosinophils Relative: 2 % (ref 0–5)
Eosinophils Relative: 2 % (ref 0–5)
Lymphocytes Relative: 18 % (ref 12–46)
Lymphocytes Relative: 18 % (ref 12–46)
Lymphocytes Relative: 19 % (ref 12–46)
Monocytes Absolute: 0.6 10*3/uL (ref 0.1–1.0)
Monocytes Absolute: 0.6 10*3/uL (ref 0.1–1.0)
Monocytes Absolute: 0.8 10*3/uL (ref 0.1–1.0)
Monocytes Absolute: 1 10*3/uL (ref 0.1–1.0)
Monocytes Relative: 10 % (ref 3–12)
Monocytes Relative: 6 % (ref 3–12)
Neutro Abs: 6 10*3/uL (ref 1.7–7.7)
Neutro Abs: 6 10*3/uL (ref 1.7–7.7)
Neutrophils Relative %: 79 % — ABNORMAL HIGH (ref 43–77)

## 2010-06-22 LAB — COMPREHENSIVE METABOLIC PANEL
ALT: 26 U/L (ref 0–53)
AST: 30 U/L (ref 0–37)
Albumin: 1.6 g/dL — ABNORMAL LOW (ref 3.5–5.2)
Albumin: 1.8 g/dL — ABNORMAL LOW (ref 3.5–5.2)
Alkaline Phosphatase: 114 U/L (ref 39–117)
Alkaline Phosphatase: 145 U/L — ABNORMAL HIGH (ref 39–117)
BUN: 3 mg/dL — ABNORMAL LOW (ref 6–23)
BUN: 4 mg/dL — ABNORMAL LOW (ref 6–23)
CO2: 26 mEq/L (ref 19–32)
Chloride: 103 mEq/L (ref 96–112)
Creatinine, Ser: 0.47 mg/dL (ref 0.4–1.5)
Creatinine, Ser: 0.48 mg/dL (ref 0.4–1.5)
GFR calc Af Amer: >60 mL/min (ref >60)
GFR calc non Af Amer: >60 mL/min (ref >60)
Potassium: 3.6 mEq/L (ref 3.5–5.1)
Potassium: 4.2 mEq/L (ref 3.5–5.1)
Total Bilirubin: 3.1 mg/dL — ABNORMAL HIGH (ref 0.3–1.2)
Total Bilirubin: 4 mg/dL — ABNORMAL HIGH (ref 0.3–1.2)
Total Protein: 5.1 g/dL — ABNORMAL LOW (ref 6.0–8.3)
Total Protein: 5.6 g/dL — ABNORMAL LOW (ref 6.0–8.3)

## 2010-06-22 LAB — BASIC METABOLIC PANEL
BUN: 2 mg/dL — ABNORMAL LOW (ref 6–23)
BUN: 7 mg/dL (ref 6–23)
CO2: 27 mEq/L (ref 19–32)
Calcium: 8.3 mg/dL — ABNORMAL LOW (ref 8.4–10.5)
Creatinine, Ser: 0.71 mg/dL (ref 0.4–1.5)
GFR calc non Af Amer: >60 mL/min (ref >60)
GFR calc non Af Amer: >60 mL/min (ref >60)
Glucose, Bld: 113 mg/dL — ABNORMAL HIGH (ref 70–99)
Glucose, Bld: 127 mg/dL — ABNORMAL HIGH (ref 70–99)
Potassium: 4.1 mEq/L (ref 3.5–5.1)

## 2010-06-22 LAB — MAGNESIUM
Magnesium: 2.1 mg/dL (ref 1.5–2.5)
Magnesium: 2.4 mg/dL (ref 1.5–2.5)

## 2010-06-22 LAB — PHOSPHORUS
Phosphorus: 3.8 mg/dL (ref 2.3–4.6)
Phosphorus: 3.9 mg/dL (ref 2.3–4.6)
Phosphorus: 4 mg/dL (ref 2.3–4.6)

## 2010-06-22 NOTE — Progress Notes (Signed)
Addended by: Fuller Canada on: 06/22/2010 02:32 PM   Modules accepted: Orders

## 2010-06-22 NOTE — Discharge Summary (Signed)
Separate identifiable. X-ray report followup for fracture and internal fixation.  4 views of the LEFT femur are obtained.  Proximal and distally locked retrograde femoral nail is fixating a distal 3rd, middle 3rd junction femoral shaft fracture with alignment in acceptable position

## 2010-06-22 NOTE — Progress Notes (Signed)
Postoperative visit.  Postop visit #1 dose of a 17.  Procedure LEFT retrograde intramedullary nailing of the femur.  Smith & Nephew TriGen 13 mm x 40 cm nail locked proximally and distally.  X-rays today show alignment is good.  Incision looks good.  Major problem now is impaction.  Patient given magnesium citrate advised if no improvement to go to the hospital for disimpaction

## 2010-06-23 ENCOUNTER — Ambulatory Visit: Payer: Medicare Other | Admitting: Orthopedic Surgery

## 2010-06-28 ENCOUNTER — Emergency Department (HOSPITAL_COMMUNITY): Payer: Medicare Other

## 2010-06-28 ENCOUNTER — Emergency Department (HOSPITAL_COMMUNITY)
Admission: EM | Admit: 2010-06-28 | Discharge: 2010-06-28 | Disposition: A | Discharge status: Home / Self Care | Payer: Medicare Other | Attending: Emergency Medicine | Admitting: Emergency Medicine

## 2010-06-28 DIAGNOSIS — K279 Peptic ulcer, site unspecified, unspecified as acute or chronic, without hemorrhage or perforation: Secondary | ICD-10-CM | POA: Insufficient documentation

## 2010-06-28 DIAGNOSIS — R0602 Shortness of breath: Secondary | ICD-10-CM | POA: Insufficient documentation

## 2010-06-28 DIAGNOSIS — R1013 Epigastric pain: Secondary | ICD-10-CM | POA: Insufficient documentation

## 2010-06-28 DIAGNOSIS — K219 Gastro-esophageal reflux disease without esophagitis: Secondary | ICD-10-CM | POA: Insufficient documentation

## 2010-06-28 DIAGNOSIS — R748 Abnormal levels of other serum enzymes: Secondary | ICD-10-CM | POA: Insufficient documentation

## 2010-06-28 DIAGNOSIS — B1920 Unspecified viral hepatitis C without hepatic coma: Secondary | ICD-10-CM | POA: Insufficient documentation

## 2010-06-28 DIAGNOSIS — G8220 Paraplegia, unspecified: Secondary | ICD-10-CM | POA: Insufficient documentation

## 2010-06-28 DIAGNOSIS — R079 Chest pain, unspecified: Secondary | ICD-10-CM | POA: Insufficient documentation

## 2010-06-28 LAB — BRAIN NATRIURETIC PEPTIDE: Pro B Natriuretic peptide (BNP): 48.2 pg/mL (ref 0.0–100.0)

## 2010-06-28 LAB — POCT CARDIAC MARKERS
Myoglobin, poc: 46.3 ng/mL (ref 12–200)
Troponin i, poc: <0.05 ng/mL (ref 0.00–0.09)

## 2010-06-28 LAB — CBC
Hemoglobin: 12.4 g/dL — ABNORMAL LOW (ref 13.0–17.0)
MCHC: 31.7 g/dL (ref 30.0–36.0)
RBC: 4.7 MIL/uL (ref 4.22–5.81)

## 2010-06-28 LAB — DIFFERENTIAL
Basophils Absolute: 0 10*3/uL (ref 0.0–0.1)
Basophils Relative: 0 % (ref 0–1)
Neutro Abs: 9.7 10*3/uL — ABNORMAL HIGH (ref 1.7–7.7)
Neutrophils Relative %: 90 % — ABNORMAL HIGH (ref 43–77)

## 2010-06-28 LAB — COMPREHENSIVE METABOLIC PANEL
Albumin: 3.4 g/dL — ABNORMAL LOW (ref 3.5–5.2)
BUN: 8 mg/dL (ref 6–23)
CO2: 27 mEq/L (ref 19–32)
Chloride: 103 mEq/L (ref 96–112)
Creatinine, Ser: 0.6 mg/dL (ref 0.4–1.5)
Total Bilirubin: 1.6 mg/dL — ABNORMAL HIGH (ref 0.3–1.2)
Total Protein: 8.2 g/dL (ref 6.0–8.3)

## 2010-06-30 ENCOUNTER — Ambulatory Visit (INDEPENDENT_AMBULATORY_CARE_PROVIDER_SITE_OTHER): Payer: Medicare Other | Admitting: Orthopedic Surgery

## 2010-06-30 ENCOUNTER — Encounter: Payer: Self-pay | Admitting: Orthopedic Surgery

## 2010-06-30 DIAGNOSIS — S7290XA Unspecified fracture of unspecified femur, initial encounter for closed fracture: Secondary | ICD-10-CM

## 2010-06-30 DIAGNOSIS — G8220 Paraplegia, unspecified: Secondary | ICD-10-CM | POA: Insufficient documentation

## 2010-06-30 DIAGNOSIS — S7292XA Unspecified fracture of left femur, initial encounter for closed fracture: Secondary | ICD-10-CM | POA: Insufficient documentation

## 2010-06-30 NOTE — Patient Instructions (Signed)
DOS 07/03/10  Preop at [facility] on 07/01/10 tomorrow at 1:30pm, take packet with you to [facility] at that time  Post op 1 in our office on Monday 07/06/10
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
## 2010-06-30 NOTE — Progress Notes (Signed)
Postoperative visit #2 day #25  Procedure LEFT retrograde intramedullary nail of femur  Implant Smith & Nephew TriGen proximal and distal locking nail last x-rays showed no tears placement of the fracture and his clinical exam showed normal incision  He has noticed swelling over the medial aspect of his knee distally.  On exam there is redness and prominence of one of the screws.  I suspect it is migrated medially with his osteoporosis and osteopenia  Recommend removing the screw and replacing it.  He is scheduled for surgery risks benefits explained  History and physical to be dictated at a later time and is incorporated by reference

## 2010-07-01 ENCOUNTER — Telehealth: Payer: Self-pay | Admitting: Orthopedic Surgery

## 2010-07-01 ENCOUNTER — Encounter (HOSPITAL_COMMUNITY): Payer: Medicare Other

## 2010-07-01 NOTE — Telephone Encounter (Signed)
Re: out-patient surgery scheduled 07/03/10 at [facility] for removal/exchange of hardware LT femur, no prior authorization required per note.

## 2010-07-03 ENCOUNTER — Ambulatory Visit (HOSPITAL_COMMUNITY)
Admission: RE | Admit: 2010-07-03 | Discharge: 2010-07-03 | Disposition: A | Discharge status: Home / Self Care | Payer: Medicare Other | Source: Ambulatory Visit | Attending: Orthopedic Surgery | Admitting: Orthopedic Surgery

## 2010-07-03 ENCOUNTER — Ambulatory Visit (HOSPITAL_COMMUNITY): Payer: Medicare Other

## 2010-07-03 DIAGNOSIS — Y849 Medical procedure, unspecified as the cause of abnormal reaction of the patient, or of later complication, without mention of misadventure at the time of the procedure: Secondary | ICD-10-CM | POA: Insufficient documentation

## 2010-07-03 DIAGNOSIS — T84498A Other mechanical complication of other internal orthopedic devices, implants and grafts, initial encounter: Secondary | ICD-10-CM

## 2010-07-03 LAB — SURGICAL PCR SCREEN
MRSA, PCR: NEGATIVE
Staphylococcus aureus: NEGATIVE

## 2010-07-06 ENCOUNTER — Ambulatory Visit: Payer: Medicare Other | Admitting: Orthopedic Surgery

## 2010-07-07 ENCOUNTER — Ambulatory Visit (INDEPENDENT_AMBULATORY_CARE_PROVIDER_SITE_OTHER): Payer: Medicare Other | Admitting: Orthopedic Surgery

## 2010-07-07 DIAGNOSIS — IMO0002 Reserved for concepts with insufficient information to code with codable children: Secondary | ICD-10-CM

## 2010-07-07 DIAGNOSIS — S7292XA Unspecified fracture of left femur, initial encounter for closed fracture: Secondary | ICD-10-CM

## 2010-07-07 DIAGNOSIS — IMO0001 Reserved for inherently not codable concepts without codable children: Secondary | ICD-10-CM

## 2010-07-07 DIAGNOSIS — S7290XA Unspecified fracture of unspecified femur, initial encounter for closed fracture: Secondary | ICD-10-CM

## 2010-07-07 NOTE — H&P (Signed)
  NAMEANTINIO, [name]               ACCOUNT NO.:  [id number]  MEDICAL RECORD NO.:  [id number]           PATIENT TYPE:  O  LOCATION:  DAY                           FACILITY:  [facility]  PHYSICIAN:  Vickki Hearing, M.D.DATE OF BIRTH:  Nov 28, 1960  DATE OF ADMISSION:  06/30/2010 DATE OF DISCHARGE:  LH                             HISTORY & PHYSICAL   CHIEF COMPLAINT:  Pain over the distal portion of the left femur.  HISTORY OF PRESENT ILLNESS:  This is a 50 year old male underwent intramedullary retrograde nailing of the left femur secondary to femur fracture on June 05, 2010, presented back to the office complaining of medial swelling.  X-ray showed prominence of the distal locking screw with some redness and clinical exam shows bursitis from the prominent screw.  I have advised him the screw needs to be removed to prevent penetration through the skin.  He has agreed to the surgery.  The patient is paraplegic.  MEDICATIONS:  Colace, hydrocodone, methadone, Lyrica, ranitidine and zolpidem.  His history is noted for history of being a smoker and history of use of alcohol.  REVIEW OF SYSTEMS:  He has atonic bladder and he uses self- catheterizations.  He has a right sacral decubitus.  He has paraplegia affecting his lower extremities, but he transfers well in and out of bed and out of a scooter.  He denies any fever or chills, blurred vision, headache, chest pain, shortness of breath, heartburn, but he does get nausea and can become constipated easily.  No numbness or tingling.  No hallucinations.  Normal clotting.  No excessive thirst or urination and no severe food reactions.  ALLERGIES:  No known allergies.  PHYSICAL EXAM:  GENERAL:  He is a very thin male.  He is oriented x3. His mood and affect are pleasant.  He is confined to a motorized device to ambulate.  His upper extremities are without deformity, contracture, subluxation, muscle atrophy or skin lesion  Pulse  and temperature normal.  Left femur is clinically aligned well.  There is prominence of one of the distal screws, which has been marked with an x-ray marker.  He is tender there.  There is reactive bursal tissue.  He has knee flexion of 120 degrees.  No contracture really.  IMPRESSION:  Prominent hardware, left distal femur.  PLAN:  Remove/exchange, distal screws left femur.     Vickki Hearing, M.D.     SEH/MEDQ  D:  07/02/2010  T:  07/02/2010  Job:  [id number]  Electronically Signed by Fuller Canada M.D. on 07/07/2010 09:56:43 AM

## 2010-07-07 NOTE — Progress Notes (Signed)
Initial surgery was on March 30 Retrograde femoral nail   Orthopedic implant exchange. Distal femoral screw for protruding implant date April 27.  Distal wound from recent surgery looks good.  Proximal wound shows stitch abscess, which was removed cleaned and dressed. Patient advised to apply topical antibiotic ointment and keep clean, closed, and covered.  Followup for staple removal.

## 2010-07-07 NOTE — Op Note (Signed)
  [name], [name]               ACCOUNT NO.:  [id number]  MEDICAL RECORD NO.:  [id number]           PATIENT TYPE:  O  LOCATION:  DAYP                          FACILITY:  [facility]  PHYSICIAN:  Vickki Hearing, M.D.DATE OF BIRTH:  Jul 31, 1960  DATE OF PROCEDURE:  07/03/2010 DATE OF DISCHARGE:                              OPERATIVE REPORT   OPERATIVE NOTE:  He is a 50 year old male who had a left femoral shaft fracture treated with retrograde femoral nailing.  During the postoperative followup, we noted that his distal screw had migrated medially and was causing him bursitis and pain, so we advised him to have removed.  PREOPERATIVE DIAGNOSIS:  Orthopedic implant malfunction.  POSTOPERATIVE DIAGNOSIS:  Orthopedic implant malfunction.  PROCEDURE:  Exchange of distal femoral screw from an 85 mm to a 75-mm screw.  IMPLANT:  Smith and Nephew retrograde femoral nail.  SURGEON:  Vickki Hearing, MD  ASSISTANT:  Lucrezia Europe.  ANESTHESIA:  Local with sedation.  OPERATIVE FINDINGS:  Intact screw.  DETAILS:  After site marking and chart update, the patient was brought to surgery.  He got his preoperative antibiotic.  He was given some sedation and local injection after sterile prep and drape and time-out.  We made the incision over the previous incision, divided the deep tissues, removed the screw, remeasured the screw and the drill hole and under C-arm guidance settled in a 75-mm screw and we got a decent purchase.  We palpated the medial side to make sure that this screw was not protruding.  We also checked the proximal screw.  We then irrigated the wound, closed with 0 Monocryl, 2-0 Monocryl and staples.  Sterile dressing was applied.  The patient was taken to recovery room in stable condition and he will return to his previous function.     Vickki Hearing, M.D.     SEH/MEDQ  D:  07/03/2010  T:  07/04/2010  Job:  [id number]  Electronically Signed by Fuller Canada M.D. on 07/07/2010 09:56:47 AM

## 2010-07-15 ENCOUNTER — Ambulatory Visit (INDEPENDENT_AMBULATORY_CARE_PROVIDER_SITE_OTHER): Payer: Medicare Other | Admitting: Orthopedic Surgery

## 2010-07-15 DIAGNOSIS — S7290XA Unspecified fracture of unspecified femur, initial encounter for closed fracture: Secondary | ICD-10-CM

## 2010-07-15 MED ORDER — CEPHALEXIN 500 MG PO CAPS: 500.0000 mg | ORAL_CAPSULE | Freq: Four times a day (QID) | ORAL | Status: AC

## 2010-07-15 MED ORDER — OXYCODONE-ACETAMINOPHEN 5-325 MG PO TABS: 1.0000 | ORAL_TABLET | ORAL | Status: DC | PRN

## 2010-07-15 NOTE — Progress Notes (Signed)
Postoperative followup.  Procedure #1 intramedullary nailing retrograde nail LEFT femur for femur fracture. Date of surgery March 30.  The 2nd procedure exchange of distal screw. 07/03/2010  X-rays today LEFT femur AP and lateral multiple views.  Fracture alignment is excellent. Screw placement is normal.  As far as the proximal incision goes its improved , it is closed with no signs of erythema Distal incision: red staple line so I removed the staples .  Fracture LEFT femur, status post internal fixation with retrograde nail

## 2010-07-20 ENCOUNTER — Telehealth: Payer: Self-pay | Admitting: Orthopedic Surgery

## 2010-07-20 NOTE — Telephone Encounter (Signed)
Winfield is feeling very nauseaous, asking if it could be the antibiotics he is taking or do you think it is coming from the infection.  He is taking Cephalexin 500 mg 4 times a day.  He said he is feeling very weird, told him he may need to go to the ER to get checked out.  He uses [facility] in [place]

## 2010-07-21 NOTE — Assessment & Plan Note (Signed)
NAMEJHASE, CREPPEL                  CHART#:  [id number]   DATE:  05/16/2008                       DOB:  12/14/1960   CHIEF COMPLAINT:  Possible partial biliary obstruction, status post  cholecystectomy.   SUBJECTIVE:  The patient is a 50 year old male with history of  paraplegia and polysubstance abuse.  He also has chronic hepatitis C.  He was hospitalized at [facility] for acute cholecystitis and  underwent a laparoscopic converted to open cholecystectomy with CBD  exploration with intraoperative cholangiogram and subsequently, he had a  T-tube and biliary stent placed.  He has done well since that time.  He  tells me he is no longer drinking alcohol or using drugs.  He has lost a  significant amount of weight, but tells me his appetite is much improved  now.  He has had some transient nausea initially postop, but this is  better.  He had some chest pain, this has improved.  He denies any  abdominal pain at this time.  He denies any fever or chills.  He has  noticed some dark urine.  His bowel movements are normal, soft, and  brown.  He did notice some clay-colored stools intermittently.  Denies  any rectal bleeding or melena.   He did have a cholangiogram via his existing T-tube.  There was a mobile  filling defect in the proximal common hepatic duct near the confluence  of the intrahepatic ducts.  I spoke with Dr. Jena Gauss who did review the  films with Dr. Genevive Bi.  It is felt that there may have been  artifact on the images.  It does not appear to confirm a stone.  It is  felt that further images are needed prior to an attempted ERCP.   CURRENT MEDICATIONS:  Methadone 60 mg daily, Neurontin 800 mg t.i.d.   ALLERGIES:  No known drug allergies.   OBJECTIVE:  VITAL SIGNS:  Temperature 97.5, blood pressure 118/80, and  pulse 80.  GENERAL:  The patient is an alert, oriented, pleasant, and cooperative  Caucasian male, in no acute distress.  He is wheelchair bound.   He is  paraplegic.  HEENT:  Sclerae clear, nonicteric.  Conjunctivae pink.  Oropharynx pink  and moist without any lesions.  CHEST:  Heart regular rate and rhythm.  Normal S1 and S2.  ABDOMEN:  With G tube and drain in place.  Site is clear.  Abdomen has  positive bowel sounds, soft, nontender without palpable mass.  No  rebound, tenderness, or guarding.  EXTREMITIES:  Without edema.   ASSESSMENT:  The patient is a 51 year old Caucasian male paraplegic who  is status post open cholecystectomy with T-tube and biliary stent  placement.  He will need T-tube removal.  However, there is question as  to whether there is residual stone versus artifact on most recent  cholangiogram from May 07, 2008, which has been reviewed with Dr. Jena Gauss  and Dr. Genevive Bi.  It appears as though the patient will need  further imaging via injection of the T-tube under various different  angle to determine in fact whether this is artifact or so.  I will speak  with the radiologist extension [phone] tomorrow and determine whether this  can be done by Dr. Alver Fisher at [facility] or this will need to  be  done down at [facility].   Clinically, the patient is doing very well.  He denies any problems at  this time.  He has had some weight loss, but he is eating well and I  suspect that this will gradually get better.   PLAN:  Further recommendation is pending.  Discussion with the  radiologist regarding repeating cholangiogram with more angled images.  Clinically, he is doing well.  We will request most recent LFTs done at  Dr. Otilio Saber office.       Lorenza Burton, N.P.  Electronically Signed     R. Roetta Sessions, M.D.  Electronically Signed    KJ/MEDQ  D:  05/16/2008  T:  05/17/2008  Job:  [id number]   cc:   Melvyn Novas, MD
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
## 2010-07-21 NOTE — H&P (Signed)
[name], [name]               ACCOUNT NO.:  [id number]   MEDICAL RECORD NO.:  [id number]          PATIENT TYPE:  INP   LOCATION:  A309                          FACILITY:  [facility]   PHYSICIAN:  Tilford Pillar, MD      DATE OF BIRTH:  August 08, 1960   DATE OF ADMISSION:  03/01/2008  DATE OF DISCHARGE:  LH                              HISTORY & PHYSICAL   CHIEF COMPLAINT:  Abdominal pain, epigastric pain.   HISTORY OF PRESENT ILLNESS:  The patient is a 50 year old male with a  history of paraplegia of the lower extremities, gastroesophageal reflux  disease, history of polysubstance abuse who presented to the emergency  department at [facility] this morning with approximately 24  hours of epigastric abdominal pain.  This started relatively gradually,  continuous with no radiation from the epigastrium.  He describes it as a  colicky sharp pain.  He states that it is worse with movement and  palpation.  There are no relieving features.  He denies any subjective  fevers, but has had occasional chills.  He denies any history of  jaundice.  No change in his bowel movements.  No hematochezia.  No  melena; however, he has had some constipation over the last 7 days, but  no acholic stools.  The patient has had similar symptoms in the past,  but never this severe.  The patient also admits to drinking several  beers on December 24.  He does have a heavy alcohol use history.  He  also does admit to using other recreational drug use.  He did use  powdered cocaine approximately 3 days ago.  He denies any other current  narcotic or recreational drug use at this time.   PAST MEDICAL HISTORY:  History of trauma with resulting paraplegia,  gastroesophageal reflux disease, history of polysubstance abuse, he has  been diagnosed with hepatitis C, he does require straight cath  intubation for urinary control.   PAST SURGICAL HISTORY:  He has had a previous history of multiple  skeletal operations  for history of trauma including spinal fusion.  He  does have the presence of orthopedic hardware.   MEDICATIONS:  He is on home methadone, he is on Neurontin, no other  medications.  He is currently on Mefoxin on this admission.   ALLERGIES:  No known drug allergies.   SOCIAL HISTORY:  He has a positive tobacco smoking history.  He has  positive alcohol use and the last utilization being 2 days ago,  relatively heavy.  He has a positive drug history with a history of  narcotic abuse as well as cocaine abuse.   REVIEW OF SYSTEMS:  He denies any recent weight changes.  No headaches.  He has no chest pain, no shortness of breath.  GASTROINTESTINAL:  As per  his HPI, otherwise unremarkable.  GENITOURINARY:  As per past medical,  otherwise unremarkable.  MUSCULOSKELETAL:  As per past medical,  otherwise unremarkable.  SKIN:  He does have a rash in the gluteal  region.  NEUROLOGIC:  As per past medical, otherwise no acute change.  PHYSICAL EXAMINATION:  VITAL SIGNS:  Temperature 98.1, heart rate 102,  respiratory rate 22, blood pressure 155/99.  GENERAL:  The patient does not appeared to be in any acute distress.  He  is somewhat anxious on his appearance with somewhat rapid speech.  He is  alert.  He is oriented.  In general, he is lying in supine position in  his hospital bed.  He is easily able to change his sitting position.  HEENT:  Pupils are equal, round, reactive.  Extraocular movements are  intact.  No conjunctival pallor or scleral icterus is noted.  He has a  pink oral mucosa.  NECK:  Trachea is midline.  No cervical lymphadenopathy is apparent.  PULMONARY:  He has unlabored respirations.  He is clear to auscultation  bilaterally.  CARDIOVASCULAR:  He is tachycardic, but regular rhythm.  No murmurs or  gallops are apparent on evaluation.  He has 2+ radial and 2+ femoral  pulses bilaterally.  ABDOMEN:  Diminished bowel sounds.  Abdomen is soft, flat.  He does have  a severe  epigastric abdominal pain.  He does have positive Murphy's  sign.  He did not have any referred or diffuse guarding.  He has no  diffuse peritoneal signs.  There are no skin changes.  There is no  ecchymosis.  EXTREMITIES:  Warm and dry.  There is no appearance of any jaundice.  On  his gluteal region, he does have bilateral gluteal erythema.  No clear  skin breakdown or no decubitus are noted.   PERTINENT LABORATORY AND RADIOGRAPHIC STUDIES:  CBC, white blood cell  count 21.2, hemoglobin 13.7, hematocrit 41.2, platelets 414.  Basic  metabolic panel, sodium 132, potassium 3.7, chloride 97, bicarb 27, BUN  3, creatinine 0.62, blood glucose 142, total bili is 2.5, direct bili is  1.7.  CT of the abdomen and pelvis demonstrates no evidence of any free  air.  He does have some pericholecystic fluid, otherwise no free fluid  in the abdomen.  He does have distention of the gallbladder.  There is  impression of distention of the  biliary tree.  There are positive  gallstones.  No clear common bile duct stone is identified; however,  based on radiologist report, there is suspicion of a common bile duct  stone.  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  No bowel abnormalities are noted.   ASSESSMENT AND PLAN:  Acute cholecystitis, cholelithiasis.  At this  time, I also have a high suspicion of acute choledocholithiasis and  highly suspicious of cholangitis picture.  At this time, unfortunately I  am somewhat limited on the ability to further evaluate this area or to  evaluate the common bile duct stone.  The MRI is not feasible due to the  patient's previous orthopedic hardware and IVC filter.  At this time, we  will continue to treat the patient aggressively with antibiotics.  We  will continue to watch his liver closely in regards to indirect  bilirubin and direct bilirubin.  He is not currently  jaundice.  Again,  I do have a high suspicion  of an early cholangitis or  choledocholithiasis.  Should he progress, emergent decompression is  required.  The patient will be transferred to [facility] for GI  intervention with ERCP, otherwise pending and if stable, we will plan on  possible ERCP on Monday.  This was discussed with the patient.  In  addition, alcohol  and polysubstance abuse, at this time, I will continue  the patient's home dose of methadone to avoid any narcotic withdrawal.  If the patient continued to attempt some additional pain control for his  epigastric abdominal pain; however, due to his narcotic abuse history  discussed with the patient that at this point may be somewhat difficult  and that the goal is for reducing the pain to a bearable level and not  pain free level.  The patient understands this.  We will continue to  closely watch for signs of narcotic withdrawal.  Additionally, in  regards to his alcohol use, we will continue the patient on __________  to closely monitor his attempt to avoid any alcohol related withdrawal.  We will also plan on supplementing the patient with a banana bag and  folate.  I do not see any evidence of decubitus ulcer development;  however, the patient is a high risk  for this.  I will continue on nystatin powder at this time and close  monitoring and decubitus precautions on the patient.  At this time, the  patient is aware of his somewhat guarded condition due to the suspicion  of an early cholangitis.  At this time, I will continue on IV fluid, IV  antibiotics in an n.p.o. status.      Tilford Pillar, MD  Electronically Signed     BZ/MEDQ  D:  03/02/2008  T:  03/03/2008  Job:  [id number]   cc:   Delbert Harness, MD

## 2010-07-21 NOTE — Op Note (Signed)
[name], [name]               ACCOUNT NO.:  [id number]   MEDICAL RECORD NO.:  [id number]          PATIENT TYPE:  INP   LOCATION:  IC06                          FACILITY:  [facility]   PHYSICIAN:  Tilford Pillar, MD      DATE OF BIRTH:  [birth year]   DATE OF PROCEDURE:  03/05/2008  DATE OF DISCHARGE:                               OPERATIVE REPORT   PREOPERATIVE DIAGNOSES:  1. Acute cholecystitis.  2. Cholelithiasis.  3. Suspected choledocholithiasis with jaundice.   POSTOPERATIVE DIAGNOSES:  1. Acute cholecystitis.  2. Cholelithiasis.  3. Choledocholithiasis.   PROCEDURES:  1. Laparoscopic converted to open cholecystectomy, common bile duct      exploration with intraoperative fluoroscopy, and intraoperative      cholangiogram.  2. T-tube biliary stent placement.  3. Intra-abdominal drain placement.   SURGEON:  Tilford Pillar, MD   ANESTHESIA:  General endotracheal, local anesthetic 0.5% Sensorcaine  plain.   SPECIMEN:  Gallbladder.   ESTIMATED BLOOD LOSS:  505 mL.   URINE OUTPUT:  200 mL of crystalloid, 4 liters blood products, 1 unit  packed RBCs.   DISPOSITION:  Intensive care unit in stable but guarded condition.   INDICATIONS:  The patient is a 50 year old male who presented to [facility] Emergency Department with acute abdominal pain.  He had had similar  but never severe symptoms in the past.  During his admission and initial  evaluation, it was suspected that the patient had acute cholecystitis  suspected acute choledocholithiasis, possible cholangitis.  He was  started on antibiotic therapy.  Due to the patient's past history of  trauma and orthopedic plating and IV Seltzer, MRCP was not thought to be  an option in this patient to identify a common bile duct stone.  Additionally, a Gastroenterology consultation suspected more of a  compressive obstruction of the common bile duct rather than a frank  stone.  Therefore, it was discussed with the patient, the  risks,  benefits, and alternatives of a laparoscopic possible open  cholecystectomy with planned intraoperative cholangiogram, possible  common bile duct exploration, and possible T-tube stenting, possible  biliary bypass procedure.  Risks, benefits, and alternatives were  discussed including but not limited to risks of bleeding, infection,  bile leak, small bowel injury, bile duct injury, as well as the  possibility of intraoperative cardiac and pulmonary event.  In addition,  due to the patient's history of hepatitis C, a liver biopsy was  requested by Gastroenterology.  Therefore, a biopsy was discussed with  the patient, laparoscopic as well as open biopsy.  The patient was  consented for the planned procedures.   OPERATION:  The patient was taken to the operating room, placed in  supine position on the operating room table, at which time, the general  anesthetic was administered.  Once the patient was asleep, his abdomen  was prepped with DuraPrep solution and draped in standard fashion.  Of  note, the patient already had a Foley catheter placed on the floor.  Once the abdomen was draped, a stab incision was created  supraumbilically with an 11-blade scalpel.  Additional dissection down  through the subcutaneous tissue was carried out using a Kocher clamp  which was utilized to grasp the anterior abdominal wall fascia and was  anteriorly.  The Veress needle was then inserted.  Saline drop test was  utilized to confirm intraperitoneal placement and then pneumoperitoneum  was initiated.  Once sufficient pneumoperitoneum was obtained, an 11-mm  trocar was inserted over the laparoscope allowing visualization of the  trocar entering into the peritoneal cavity.  At this time, the inner  cannula was removed.  The laparoscope was reinserted.  There was no  evidence of any trocar or Veress needle placement injury.  At this time,  the remaining trocars were placed with an 11-mm trocar in  the  epigastrium, 5-mm trocar in the right lateral abdominal wall, an 11-mm  trocar in the subcostal space in the approximately midclavicular line.  These were all placed through the stab incision and placement of trocars  under direct visualization.  At this time, the patient was placed in a  reverse Trendelenburg left lateral decubitus position.  The omentum was  clearly identified and was adherent to the gallbladder and liver.  Combination of electrocautery and blunt dissection was utilized to  dissect the omentum off of the fundus of the gallbladder.  The  gallbladder was tense and was not easily grasped with a regular grasper.  Therefore, a Weck needle was utilized to attempt the aspiration.  A  minimal, dark, thick, bilious aspirate was obtained as additional fluid  was not able to be aspirated.  Any additional aspiration was  discontinued.  We continued to bluntly strip the inflammatory rind off  the gallbladder as well as the adherence of the omentum with this  dissection.  It was noted that the gallbladder appeared to be extremely  friable.  A small cholecystotomy was created during this dissection.  The stones were quickly identified and removed through the 11-mm trocar  site.  Note that no significant bilious drainage was noted during this.  We continued the dissection down onto the infundibulum.  Hemostasis was  obtained with a combination of electrocautery and Endoclip placement.  Several vessels were noted from the adherent omentum.  These were then  trolled with placement of Endoclips.  We noted during the dissection a  small capsular tear along the right lobe of the liver just adjacent to  the gallbladder.  Due to bleeding from this area as well as the  inflammatory changes of the infundibulum, cystic duct, and common bile  duct, I opted at this point to proceed with open cholecystectomy.   At this time, the open instruments were brought to the field.  A skin  incision  was created from the epigastric trocar site at the  midclavicular 5-mm trocar with a 10-blade scalpel.  Additional  dissection down to the subcu tissue including the anterior abdominal  wall fascia, anterior rectus sheath, the rectus muscles, and the  peritoneum was carried out using electrocautery.  During the opening of  the peritoneum, again inserted through the 11-mm trocar and taken to the  underlying bowel.  At this time, upon entrance into the peritoneal  cavity, the bowel testing was retracted and packed into the lower  abdomen using lap sponges.  A lap sponge was packed into the region of  the capsular tear of the right lobe of the liver, which did allow  exposure of the gallbladder.  At this point, I grasped the gallbladder  with the Allis clamps to  assist with the mobilization and retraction of  the fundus of the gallbladder.  At this time, I did began dissection  using dome down fashion with electrocautery along the gallbladder.  Allis was carried down through the right inflammatory mass where I  suspected the infundibulum and cystic duct, common bile duct was  located.  I still had some difficulty identifying the common bile duct  at this time; therefore, opted then to carefully dissect the gallbladder  free.  During this, the gallbladder which was extremely friable was torn  and was placed in the back table.  It was sent to the pathology.  At  this time, I did encounter bilious drainage from the lamina of the  infundibulum and cystic duct.  Continued dissection on this area was  carried out with a sharp dissection.  I did advance a right angle clamp  to the floor to have direction on this.  At this time, I did celebration  because at this point in time I did identify the common bile duct that  had thin bile extruding at this time.  I continued to head down onto the  common bile duct onto the cystic duct.  Multiple stones were  encountered, and at this time, I consistently  advanced the cholangiogram  catheter into the size of the duct.  I am not sure that I will be able  to adequately seal the duct or injection of contrast.  Therefore, a 8-  [place] Foley catheter was brought to the field.  This was inserted well  without difficulty into the cystic duct.  At this time, the lumen of the  catheter was inflated by 10 mL of sterile saline, flushing the catheter  resulted with flushing with saline.  At this time, fluoroscopy was  brought onto the field.   Using an Omnipaque 50/50 mixture of saline, I injected through the 8-  [place] Foley catheter.  The fluoroscopy was somewhat obscured due to the  presence of the orthopedic hardware of the patient's spine.  Angulating  the fluoroscopy did help someone.  And there was spread of the dye.  The  contrast was flowing.  This was lodged into the biliary tree.  The left  biliary tree was clearly identified.  There appeared to be a stone  blocking the right biliary tree, the right hepatic duct, and we did  irrigation, and I was able to free the stone.  During the irrigation,  retraction on the catheter because of breaks in the balloon.  Therefore,  I opted to bring cholangiocatheter with a balloon onto the field in an  attempt to intubate the distal common bile duct with significant  resistance and by proceeding with advancement of a biliary probe into  the duct, there was significant amount of resistance.  I used a stone  scoop and was able to extract several stones out of the common bile  duct.  I was able to pass the biliary probe without difficulty.  I  reattempted to advance the balloon cholangiocatheter, I was able to pass  it distally.  Inflated the balloon and proceeded with the distal  cholangiogram.  At this time, the contrast was able to pass distally  through the distal common bile duct into the duodenum.  Several small  stones were suspected in duodenum.  We tried to flush this with general  improvement of  the fluoroscopy.  At this time, I opted due to the large  size of the common bile  duct for the presence of multiple stones, to  place a T-tube of common bile duct with plans for Interventional  Radiology extraction of any additional stone in the future time.  At  this time, the common bile duct was decompressed.  Due to the  inflammation and inflammatory changes of the wall of the common bile  duct, I did not feel that proceeding with a biliary bypass would be in  best interest, and at this time, I brought a 14-French T-tube into the  field.  The T-tube was trimmed to appropriate size.  The short limb stay  advanced cephalad.  The proximal biliary tree distally down the common  bile duct towards the duodenum.  The catheter was in place.  I then used  a 3-0 Prolene in simple interrupted fashion to reapproximate the common  bile duct with T-tube.  The T-tube was flushed with minimal  extravasation of saline.  At this time, I turned attention to placement  of the drain.   Adjacent to the right lateral abdominal wall, 5-mm trocar site, I did  create additional stab incision through which the T-tube was placed.  Through the previous 11-mm trocar site, I did place in a 10-flat JP  drain through the anterior abdominal wall.  The JP drain was placed  adjacent to the common bile duct prior to closure of the gallbladder  fossa and finally it passed along the right paracolic gutter.  A  reinspection of the capsular tear the right lobe liver demonstrated  hemostasis.  At this time, I copiously irrigated the surgical field with  sterile saline.  The returning aspirate was clear.  I was not able to  feel any additional large stones on palpation.  The guardings were  removed during the procedure.  At this time, I turned my attention to  biopsy of the liver.   Using a Tru-Cut biopsy needle, I obtained several specimens of liver and  presented this specimen to pathology as well as gallbladder.   Hemostasis  was obtained at the site of biopsy using electrocautery.  At this time,  there was excellent hemostasis.  I was quite pleased with the placement  of the T-tube and JP drains.  At this time, I turned my attention to  closure.   Using a 3-0 nylon suture the T-tube was secured to the skin for  redundancy.  The JP drain was also secured to the skin again with 3-0  nylon suture.  The peritoneum was reapproximated using a running 0  Vicryl suture.  The anterior abdominal fascia was reapproximated using a  loop of suture in running fashion.  The tail of the suture was cut.  Once this was done, the wound was irrigated.  The skin staples were  utilized to reapproximate the skin at the remaining 11-mm trocar site  and along the subcostal incision.  I did make every attempt to  reapproximate the patient's abdominal wall.  At this point, the abdomen  was washed and dried with moist and dry towel.  Sterile dressings were  placed.  The dressings were removed.  The dressings were secured.  The  JP was bulb suctioned.  T-tube was attached to bag drainage.  At this  time, the patient was transferred to intensive care unit bed and was  transferred to intensive care unit in stable cardiac condition.  The  patient will be kept intubated this evening through the length of the  procedure with the patient's history of all  of these.  It is expected to  have difficulty with postoperative pain management.      Tilford Pillar, MD  Electronically Signed     BZ/MEDQ  D:  03/05/2008  T:  03/06/2008  Job:  [id number]   cc:   R. Roetta Sessions, M.D.  [address]  [place]  [place] [address]

## 2010-07-21 NOTE — H&P (Signed)
[name], [name]               ACCOUNT NO.:  [id number]   MEDICAL RECORD NO.:  [id number]          PATIENT TYPE:  INP   LOCATION:  A316                          FACILITY:  [facility]   PHYSICIAN:  Melvyn Novas, MDDATE OF BIRTH:  [birth year]   DATE OF ADMISSION:  08/18/2008  DATE OF DISCHARGE:  LH                              HISTORY & PHYSICAL   The patient is a 50 year old paraplegic, white male, fell off a ladder,  who has apparently had a PET string wrapped around his penis 9 days ago  which created a hematoma and swelling of his penis and scrotal area.  He  continued to observe this and watched it subsided on its own and today  he presents with some discomfort in the perineal region, some swelling  and inflammation and induration.  CT scan of pelvis was done which just  revealed some inflammation of perineal soft tissues, no definite  hematoma and no definite abscess revealed, but I suspect due to  underlying possible infection, he was placed on antibiotics and have a  surgical consultation performed in a.m.  He has diminished sensation  below the waist due to paraplegia.  He denies any dysuria or frequency.  He does straight cath himself.  Denies any hematuria, although he does  have a fever.  He denies cough, sputum, or hemoptysis.   PAST MEDICAL HISTORY:  Significant for,  1. 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  2. History of cocaine abuse, now rectified.  3. Chronic pain.  4. Perineal sacral decubiti.  5. Manic behavior, possible bipolar, never formally diagnosed or      treated.  6. Possible ADHD.  7. Hepatitis C.  8. Acid reflux.   CURRENT MEDICATIONS:  1. Methadone 20 mg p.o. t.i.d.  2. Lyrica 75 mg p.o. b.i.d.   SOCIAL HISTORY:  He lives alone.  He is very self-sufficient, proud of  that; smokes half a pack to a pack per day.  Does most household chores  himself.   ALLERGIES:  He has no known allergies.   PHYSICAL EXAMINATION:  VITAL SIGNS:  Blood  pressure at present is  131/74, pulse is 110 and regular, respiratory rate is 20, and  temperature 101.6 orally.  EYES:  PERRLA.  Extraocular movement intact.  Sclerae clear.  Conjunctivae pink.  NECK:  No JVD.  No carotid bruits.  No thyromegaly or thyroid bruits.  LUNGS:  Prolonged expiratory phase, scattered rhonchi.  No rales or  wheezes appreciable.  HEART:  Regular rate and rhythm.  No murmurs, gallops, heaves, thrills,  or rubs.  ABDOMEN:  Soft and nontender.  Bowel sounds are normoactive.  No  guarding or rebound, or hepatosplenomegaly.  GENITALIA:  Penis shaft appears somewhat swollen, scrotum appears  erythematous and edematous.  Perineal areas seems erythematous,  edematous, and mildly indurated, and some mild tenderness to palpation.  EXTREMITIES:  No clubbing, cyanosis, or edema.  NEUROLOGIC:  He is paraplegic from the waist down.  Cranial nerves II  through XII are grossly intact.  The patient moves all 4 extremities.   IMPRESSION:  1. Trauma  to the penis and scrotum with subsequent edema and      inflammation.  2. Significant perineal induration and inflammation, suspect possible      early infection.  3. Paraplegia.  4. Chronic pain.  5. Hepatitis C.  6. Status post substance abuse with opioids and cocaine and ethanol,      all of which are rectified.   PLAN:  To admit.  Continue methadone 20 mg p.o. t.i.d., add Naprosyn 500  p.o. b.i.d.  We will empirically start Avelox IV 400 mg daily.  Blood  cultures were drawn here, UA and C and S were done.  We will obtain  surgical consult in a.m. nonurgent and I will make further  recommendations as the database expands.      Melvyn Novas, MD  Electronically Signed     RMD/MEDQ  D:  08/18/2008  T:  08/19/2008  Job:  [id number]

## 2010-07-21 NOTE — Consult Note (Signed)
[name], [name]               ACCOUNT NO.:  [id number]   MEDICAL RECORD NO.:  [id number]          PATIENT TYPE:  INP   LOCATION:  A316                          FACILITY:  [facility]   PHYSICIAN:  Tilford Pillar, MD      DATE OF BIRTH:  1960-08-19   DATE OF CONSULTATION:  DATE OF DISCHARGE:                                 CONSULTATION   CHIEF COMPLAINT:  Scrotal swelling.   HISTORY OF PRESENT ILLNESS:  The patient is a 50 year old male well  known to me from a previous choledocholithiasis and extensive operative  intervention who presented yesterday to [facility] with  increasing scrotal and penile swelling.  The patient is paraplegic and  has limited sensation of below the abdomen.  He states that he had been  on a riding lawn mower and had been wearing a pair of newly  purchased  pants.  Apparently, these pants did have a drawstring on it with a  knotted with a luke at sometime during the course of his work, the  drawstring around the shaft at the penis.  This was not noted again due  to the patient's limited sensation and after several hours, which the  patient states probably closer to 4 hours, he noted when he went to  urinate that he had extreme swelling of the distal penis.  He did  obviously removed this strangulating string at this time, and continued  to monitor.  This was approximately a week ago and continues to watch  things closely at home.  Since that time, he has had persistent swelling  and increasing erythema in the area.  Denies any fever or chills.  He  has not had any nausea or vomiting.  There are no difficulties with  urination or bowel function.  He has had some minimal discomfort but  again the patient had a little sensation in this area.   PAST MEDICAL HISTORY:  GERD, chronic pain, hepatitis C, paraplegia,  history of choledocholithiasis.   PAST SURGICAL HISTORY:  He has had a common bile duct exploration  requirement of a G-tube placement back in  December for, which he is  fully recovered.   MEDICATIONS:  Methadone, Lyrica.   ALLERGIES:  No known drug allergies.   SOCIAL HISTORY:  He is a current smoker.  Past heavy alcohol, narcotic,  and cocaine abuse.  He is stated that no alcohol or cocaine use since  December to me, denies any current usage.   PHYSICAL EXAMINATION:  VITAL SIGNS:  From admission; afebrile,  temperature 98.5, heart rate 99, respirations 20, blood pressure 125/70,  he is 100% O2 saturation on room air.  GENERAL:  The patient is in a seated position in this hospital bed.  He  is alert and oriented.  He is pleasant.  HEENT:  Scalp no deformities.  Pupils are equal, round, and reactive.  Extraocular movements are intact.  Oral mucosa pink.  Normal occlusion.  NECK:  Trachea is midline.  PULMONARY:  Unlabored respirations.  He is clear to auscultation.  CARDIOVASCULAR:  Regular rate and rhythm.  He  has no murmurs or gallops.  ABDOMEN:  Positive bowel sounds.  Abdomen is soft, nontender.  On evaluation of his groin and perineum, he has significant swelling in  the perineum.  There is some overlying erythema.  This is somewhat  indurated.  No fluctuance is noted.  He has some mild discomfort to  palpation.  His bilateral scrotum is slightly enlarged.  There is some  erythema with the shaft of the penis, but no edema is noted and meatus  looks patent.  No bloody discharge is noted.  No ecchymosis is  appreciated.  There is no crepitance within the perineum.  No inguinal  lymphadenopathy.  EXTREMITIES:  Lower extremities are atraumatic.   ASSESSMENT AND PLAN:  Perineal edema, scrotal edema.  At this time, I  suspect the patient is still having inflammatory response due to the  ischemia.  Associated with the shaft of the penis.  At this time, I do  not see any signs of nonviable tissue and no evidence of any infectious  etiology, I would recommend continuing with local measures, including  heat and ice as needed  to help minimize the swelling in this area.  I  did discuss with the patient that this may take some time to resolve.  I  will continue to follow the patient closely.  Should he develop any  difficulties with urination, Urology consultation may be required.      Tilford Pillar, MD  Electronically Signed     BZ/MEDQ  D:  08/19/2008  T:  08/20/2008  Job:  [id number]
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
## 2010-07-21 NOTE — Op Note (Signed)
Rice, Keith               ACCOUNT NO.:  [id number]   MEDICAL RECORD NO.:  [id number]          PATIENT TYPE:  AMB   LOCATION:  DAY                           FACILITY:  [facility]   PHYSICIAN:  R. Roetta Sessions, M.D. DATE OF BIRTH:  12/08/1960   DATE OF PROCEDURE:  05/30/2008  DATE OF DISCHARGE:                               OPERATIVE REPORT   Endoscopic retrograde cholangiopancreatography with sphincterotomy and  stone extraction.   INDICATIONS FOR PROCEDURE:  A 50 year old gentleman status post open  cholecystectomy with T-tube placement secondary to common duct stones  back in December 2009.  He has done well clinically.  He has had a  couple of cholangiograms through the T-tube which ultimately  demonstrated residual filling defects consistent with stones.  He comes  now for ERCP with stone extraction.  Dr. Leticia Penna has pulled the T-tube  out just prior to ERCP.  Risks, benefits, alternatives and limitations  of this approach have been discussed with [name] previously and  again at bedside __________.  We talked about a 1/10 chance of  pancreatitis, reaction to medications, bleeding, perforation and a  potential for subsequent procedure.  His questions were answered, all  parties agreeable.   PROCEDURE NOTE:  General anesthesia was induced by Dr. Jayme Cloud and  associates.  He was placed in a semiprone position on the OR table.  He  received Levaquin 250 mg IV prior to the procedure.  Instrumentation:  Pentax video chip system.  Findings:  Cursory examination of the distal  esophagus, stomach and duodenum revealed no abnormalities.  The ampulla  of Vater was readily identified on the medial wall of the second portion  of the duodenum.  The scope was pulled back to the short position 55 cm  from incisors.  Scout film was obtained.  Using the Microvasive  sphincterotome, the ampulla was approached and tangentially a deep  cannulation was fairly immediately obtained.  The  guidewire was advanced  up early on and the sphincterotome follow-up cholangiogram was obtained.  There was at least 1 filling defect in the distal common bile duct, and  there appeared to be one in the proximal right hepatic system as well.  Biliary tree did not appear to be grossly dilated.  There was no  stricture.  I did not see any tract or extravasation related to the T-  tube tract.  The safety wire was left in place.  Under fluoroscopic  control, the sphincterotome was pulled back across the ampullary orifice  at the 12 o'clock position.  An approximately 1-cm sphincterotomy was  performed using the blended current with the ERBE unit.  This was done  without difficulty or bleeding.  Subsequently the sphincterotome was  railed off.  The sphincterotome and the balloon was railed over just  into the right hepatic duct and the balloon was inflated to accommodate  the size of the duct.  The stone up there was pulled down at the  confluence.  The balloon was inflated to a larger diameter to  accommodate the size of the duct and the balloon occlusion cholangiogram  was obtained.  This maneuver was associated with the recovery of 6  piston-appearing stones through the ampullary orifice.  There appeared  to be a small fragment left in the distal duct.  I went back with the  balloon up to the confluence and repeated the procedure and got out some  additional gravel and mushy debris.  The duct appeared to drain  extremely well with this maneuver and there were no residual filling  defects noted.  Please note the pancreatic duct was not manipulated,  cannulated or injected in any way.  The patient tolerated the procedure  very well, the fluoroscopy films final interpretation to be done with  the radiologist.   IMPRESSION:  Normal-appearing ampulla, cholangiogram revealing multiple  filling defects consistent with choledocholithiasis, status post  endoscopic sphincterotomy, balloon dredging  with recovery of multiple  stones, pancreatic duct not manipulated.   RECOMMENDATIONS:  1. Clear liquid diet beginning with lunch.  May advance diet slowly      over the next 24 hours.  2. No aspirin or arthritis medications for the next 5 days.  3. [name] is to call if he has any future GI symptoms or      problems.      [name], M.D.  Electronically Signed     RMR/MEDQ  D:  05/30/2008  T:  05/30/2008  Job:  [id number]   cc:   Tilford Pillar, MD  Fax: [phone]   Melvyn Novas, MD  Fax: [phone]

## 2010-07-22 NOTE — Telephone Encounter (Signed)
agree
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
## 2010-08-26 ENCOUNTER — Ambulatory Visit: Payer: Medicare Other | Admitting: Orthopedic Surgery

## 2010-08-26 ENCOUNTER — Encounter: Payer: Self-pay | Admitting: Orthopedic Surgery
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
## 2010-10-06 ENCOUNTER — Ambulatory Visit: Payer: Medicare Other | Admitting: Orthopedic Surgery

## 2010-12-03 LAB — CBC
Platelets: 432 — ABNORMAL HIGH
RDW: 14.2
WBC: 6

## 2010-12-03 LAB — BASIC METABOLIC PANEL
BUN: 2 — ABNORMAL LOW
Calcium: 9.6
GFR calc non Af Amer: >60
Glucose, Bld: 102 — ABNORMAL HIGH
Sodium: 141

## 2010-12-03 LAB — DIFFERENTIAL
Basophils Absolute: 0
Lymphocytes Relative: 29
Lymphs Abs: 1.7
Neutro Abs: 3.8
Neutrophils Relative %: 63

## 2010-12-03 LAB — ETHANOL: Alcohol, Ethyl (B): 121 — ABNORMAL HIGH

## 2010-12-03 LAB — RAPID URINE DRUG SCREEN, HOSP PERFORMED: Cocaine: POSITIVE — AB

## 2010-12-04 LAB — COMPREHENSIVE METABOLIC PANEL
Albumin: 4
Alkaline Phosphatase: 99
BUN: 5 — ABNORMAL LOW
CO2: 28
Chloride: 104
GFR calc non Af Amer: >60
Glucose, Bld: 123 — ABNORMAL HIGH
Potassium: 4
Total Bilirubin: 0.5

## 2010-12-04 LAB — CBC
HCT: 45.7
Hemoglobin: 15.2
Platelets: 311
RBC: 5.07
WBC: 7.9

## 2010-12-04 LAB — DIFFERENTIAL
Basophils Absolute: 0.1
Basophils Relative: 1
Monocytes Absolute: 0.3
Neutro Abs: 6.4

## 2010-12-08 ENCOUNTER — Other Ambulatory Visit (HOSPITAL_COMMUNITY): Payer: Self-pay | Admitting: Family Medicine

## 2010-12-08 DIAGNOSIS — M5410 Radiculopathy, site unspecified: Secondary | ICD-10-CM

## 2010-12-10 ENCOUNTER — Ambulatory Visit (HOSPITAL_COMMUNITY)
Admission: RE | Admit: 2010-12-10 | Discharge: 2010-12-10 | Disposition: A | Discharge status: Home / Self Care | Payer: Medicare Other | Source: Ambulatory Visit | Attending: Family Medicine | Admitting: Family Medicine

## 2010-12-10 DIAGNOSIS — M5126 Other intervertebral disc displacement, lumbar region: Secondary | ICD-10-CM | POA: Insufficient documentation

## 2010-12-10 DIAGNOSIS — M5410 Radiculopathy, site unspecified: Secondary | ICD-10-CM

## 2010-12-10 DIAGNOSIS — M545 Low back pain, unspecified: Secondary | ICD-10-CM | POA: Insufficient documentation

## 2010-12-10 DIAGNOSIS — M5137 Other intervertebral disc degeneration, lumbosacral region: Secondary | ICD-10-CM | POA: Insufficient documentation

## 2010-12-10 DIAGNOSIS — M51379 Other intervertebral disc degeneration, lumbosacral region without mention of lumbar back pain or lower extremity pain: Secondary | ICD-10-CM | POA: Insufficient documentation

## 2010-12-11 LAB — DIFFERENTIAL
Band Neutrophils: 8 % (ref 0–10)
Basophils Absolute: 0 10*3/uL (ref 0.0–0.1)
Basophils Absolute: 0 10*3/uL (ref 0.0–0.1)
Basophils Absolute: 0 10*3/uL (ref 0.0–0.1)
Basophils Relative: 0 % (ref 0–1)
Basophils Relative: 0 % (ref 0–1)
Basophils Relative: 0 % (ref 0–1)
Basophils Relative: 0 % (ref 0–1)
Basophils Relative: 0 % (ref 0–1)
Basophils Relative: 0 % (ref 0–1)
Eosinophils Absolute: 0 10*3/uL (ref 0.0–0.7)
Eosinophils Absolute: 0 10*3/uL (ref 0.0–0.7)
Eosinophils Absolute: 0 10*3/uL (ref 0.0–0.7)
Eosinophils Absolute: 0.2 10*3/uL (ref 0.0–0.7)
Lymphocytes Relative: 2 % — ABNORMAL LOW (ref 12–46)
Lymphocytes Relative: 3 % — ABNORMAL LOW (ref 12–46)
Lymphocytes Relative: 4 % — ABNORMAL LOW (ref 12–46)
Lymphocytes Relative: 7 % — ABNORMAL LOW (ref 12–46)
Lymphs Abs: 0.4 10*3/uL — ABNORMAL LOW (ref 0.7–4.0)
Lymphs Abs: 0.4 10*3/uL — ABNORMAL LOW (ref 0.7–4.0)
Lymphs Abs: 0.6 10*3/uL — ABNORMAL LOW (ref 0.7–4.0)
Lymphs Abs: 0.8 10*3/uL (ref 0.7–4.0)
Lymphs Abs: 1 10*3/uL (ref 0.7–4.0)
Lymphs Abs: 1.2 10*3/uL (ref 0.7–4.0)
Metamyelocytes Relative: 0 %
Monocytes Absolute: 0.7 10*3/uL (ref 0.1–1.0)
Monocytes Absolute: 0.8 10*3/uL (ref 0.1–1.0)
Monocytes Absolute: 0.8 10*3/uL (ref 0.1–1.0)
Monocytes Absolute: 1.3 10*3/uL — ABNORMAL HIGH (ref 0.1–1.0)
Monocytes Relative: 10 % (ref 3–12)
Monocytes Relative: 11 % (ref 3–12)
Monocytes Relative: 4 % (ref 3–12)
Monocytes Relative: 6 % (ref 3–12)
Monocytes Relative: 8 % (ref 3–12)
Neutro Abs: 12.6 10*3/uL — ABNORMAL HIGH (ref 1.7–7.7)
Neutro Abs: 12.8 10*3/uL — ABNORMAL HIGH (ref 1.7–7.7)
Neutro Abs: 15.1 10*3/uL — ABNORMAL HIGH (ref 1.7–7.7)
Neutro Abs: 8.3 10*3/uL — ABNORMAL HIGH (ref 1.7–7.7)
Neutrophils Relative %: 77 % (ref 43–77)
Neutrophils Relative %: 80 % — ABNORMAL HIGH (ref 43–77)
Neutrophils Relative %: 83 % — ABNORMAL HIGH (ref 43–77)
Neutrophils Relative %: 84 % — ABNORMAL HIGH (ref 43–77)
Neutrophils Relative %: 85 % — ABNORMAL HIGH (ref 43–77)
Neutrophils Relative %: 94 % — ABNORMAL HIGH (ref 43–77)

## 2010-12-11 LAB — CBC
HCT: 29.1 % — ABNORMAL LOW (ref 39.0–52.0)
HCT: 32.5 % — ABNORMAL LOW (ref 39.0–52.0)
HCT: 41.2 % (ref 39.0–52.0)
Hemoglobin: 11.4 g/dL — ABNORMAL LOW (ref 13.0–17.0)
Hemoglobin: 13.1 g/dL (ref 13.0–17.0)
Hemoglobin: 13.7 g/dL (ref 13.0–17.0)
Hemoglobin: 14.3 g/dL (ref 13.0–17.0)
Hemoglobin: 9.2 g/dL — ABNORMAL LOW (ref 13.0–17.0)
MCHC: 33.4 g/dL (ref 30.0–36.0)
MCHC: 33.7 g/dL (ref 30.0–36.0)
MCHC: 34.6 g/dL (ref 30.0–36.0)
MCHC: 34.7 g/dL (ref 30.0–36.0)
MCHC: 35.2 g/dL (ref 30.0–36.0)
MCV: 84.6 fL (ref 78.0–100.0)
MCV: 85.6 fL (ref 78.0–100.0)
MCV: 86.7 fL (ref 78.0–100.0)
Platelets: 195 10*3/uL (ref 150–400)
Platelets: 220 10*3/uL (ref 150–400)
Platelets: 264 10*3/uL (ref 150–400)
Platelets: 265 10*3/uL (ref 150–400)
Platelets: 446 10*3/uL — ABNORMAL HIGH (ref 150–400)
RBC: 4.47 MIL/uL (ref 4.22–5.81)
RBC: 4.76 MIL/uL (ref 4.22–5.81)
RDW: 14 % (ref 11.5–15.5)
RDW: 14.1 % (ref 11.5–15.5)
RDW: 14.3 % (ref 11.5–15.5)
RDW: 14.6 % (ref 11.5–15.5)
WBC: 16.1 10*3/uL — ABNORMAL HIGH (ref 4.0–10.5)
WBC: 9.7 10*3/uL (ref 4.0–10.5)

## 2010-12-11 LAB — COMPREHENSIVE METABOLIC PANEL
ALT: 101 U/L — ABNORMAL HIGH (ref 0–53)
ALT: 31 U/L (ref 0–53)
AST: 40 U/L — ABNORMAL HIGH (ref 0–37)
Albumin: 1.6 g/dL — ABNORMAL LOW (ref 3.5–5.2)
Albumin: 1.7 g/dL — ABNORMAL LOW (ref 3.5–5.2)
Albumin: 3.4 g/dL — ABNORMAL LOW (ref 3.5–5.2)
Alkaline Phosphatase: 172 U/L — ABNORMAL HIGH (ref 39–117)
Alkaline Phosphatase: 278 U/L — ABNORMAL HIGH (ref 39–117)
BUN: 6 mg/dL (ref 6–23)
BUN: 8 mg/dL (ref 6–23)
Calcium: 7.7 mg/dL — ABNORMAL LOW (ref 8.4–10.5)
Calcium: 7.8 mg/dL — ABNORMAL LOW (ref 8.4–10.5)
GFR calc Af Amer: >60 mL/min (ref >60)
GFR calc non Af Amer: >60 mL/min (ref >60)
GFR calc non Af Amer: >60 mL/min (ref >60)
Glucose, Bld: 109 mg/dL — ABNORMAL HIGH (ref 70–99)
Glucose, Bld: 175 mg/dL — ABNORMAL HIGH (ref 70–99)
Potassium: 3.6 mEq/L (ref 3.5–5.1)
Potassium: 3.7 mEq/L (ref 3.5–5.1)
Potassium: 3.7 mEq/L (ref 3.5–5.1)
Sodium: 134 mEq/L — ABNORMAL LOW (ref 135–145)
Sodium: 140 mEq/L (ref 135–145)
Total Bilirubin: 2.4 mg/dL — ABNORMAL HIGH (ref 0.3–1.2)
Total Protein: 4.8 g/dL — ABNORMAL LOW (ref 6.0–8.3)
Total Protein: 4.8 g/dL — ABNORMAL LOW (ref 6.0–8.3)
Total Protein: 4.9 g/dL — ABNORMAL LOW (ref 6.0–8.3)
Total Protein: 7.7 g/dL (ref 6.0–8.3)

## 2010-12-11 LAB — HEPATIC FUNCTION PANEL
AST: 118 U/L — ABNORMAL HIGH (ref 0–37)
AST: 31 U/L (ref 0–37)
Albumin: 2.4 g/dL — ABNORMAL LOW (ref 3.5–5.2)
Albumin: 2.6 g/dL — ABNORMAL LOW (ref 3.5–5.2)
Albumin: 3.4 g/dL — ABNORMAL LOW (ref 3.5–5.2)
Alkaline Phosphatase: 336 U/L — ABNORMAL HIGH (ref 39–117)
Bilirubin, Direct: 6.7 mg/dL — ABNORMAL HIGH (ref 0.0–0.3)
Indirect Bilirubin: 3.5 mg/dL — ABNORMAL HIGH (ref 0.3–0.9)
Total Bilirubin: 10.2 mg/dL — ABNORMAL HIGH (ref 0.3–1.2)
Total Bilirubin: 2.5 mg/dL — ABNORMAL HIGH (ref 0.3–1.2)
Total Protein: 6.7 g/dL (ref 6.0–8.3)
Total Protein: 6.7 g/dL (ref 6.0–8.3)

## 2010-12-11 LAB — RAPID URINE DRUG SCREEN, HOSP PERFORMED
Amphetamines: NOT DETECTED
Barbiturates: POSITIVE — AB
Benzodiazepines: POSITIVE — AB
Cocaine: POSITIVE — AB
Opiates: NOT DETECTED
Tetrahydrocannabinol: NOT DETECTED

## 2010-12-11 LAB — BLOOD GAS, ARTERIAL
Acid-base deficit: 0.7 mmol/L (ref 0.0–2.0)
Bicarbonate: 22.4 mEq/L (ref 20.0–24.0)
FIO2: 40 %
FIO2: 40 %
Mode: POSITIVE
O2 Saturation: 97.5 %
PEEP: 5 cmH2O
Pressure support: 5 cmH2O
TCO2: 20.6 mmol/L (ref 0–100)
pH, Arterial: 7.396 (ref 7.350–7.450)
pO2, Arterial: 129 mmHg — ABNORMAL HIGH (ref 80.0–100.0)
pO2, Arterial: 95.5 mmHg (ref 80.0–100.0)

## 2010-12-11 LAB — BASIC METABOLIC PANEL
BUN: 8 mg/dL (ref 6–23)
BUN: 9 mg/dL (ref 6–23)
CO2: 23 mEq/L (ref 19–32)
CO2: 26 mEq/L (ref 19–32)
CO2: 27 mEq/L (ref 19–32)
Calcium: 8.7 mg/dL (ref 8.4–10.5)
Calcium: 8.7 mg/dL (ref 8.4–10.5)
Calcium: 8.8 mg/dL (ref 8.4–10.5)
Chloride: 93 mEq/L — ABNORMAL LOW (ref 96–112)
Chloride: 99 mEq/L (ref 96–112)
Creatinine, Ser: 0.51 mg/dL (ref 0.4–1.5)
Creatinine, Ser: 0.73 mg/dL (ref 0.4–1.5)
Creatinine, Ser: 0.75 mg/dL (ref 0.4–1.5)
GFR calc Af Amer: >60 mL/min (ref >60)
GFR calc Af Amer: >60 mL/min (ref >60)
GFR calc Af Amer: >60 mL/min (ref >60)
GFR calc non Af Amer: >60 mL/min (ref >60)
GFR calc non Af Amer: >60 mL/min (ref >60)
Glucose, Bld: 145 mg/dL — ABNORMAL HIGH (ref 70–99)
Potassium: 3.7 mEq/L (ref 3.5–5.1)
Sodium: 132 mEq/L — ABNORMAL LOW (ref 135–145)

## 2010-12-11 LAB — URINALYSIS, ROUTINE W REFLEX MICROSCOPIC
Nitrite: NEGATIVE
Specific Gravity, Urine: 1.02 (ref 1.005–1.030)
Urobilinogen, UA: >8 mg/dL — ABNORMAL HIGH (ref 0.0–1.0)
pH: 6.5 (ref 5.0–8.0)

## 2010-12-11 LAB — PHOSPHORUS
Phosphorus: 2.5 mg/dL (ref 2.3–4.6)
Phosphorus: 3.4 mg/dL (ref 2.3–4.6)
Phosphorus: 4.3 mg/dL (ref 2.3–4.6)

## 2010-12-11 LAB — CROSSMATCH
ABO/RH(D): O POS
Antibody Screen: NEGATIVE

## 2010-12-11 LAB — LIPASE, BLOOD: Lipase: 16 U/L (ref 11–59)

## 2010-12-11 LAB — AMMONIA: Ammonia: 31 umol/L (ref 11–35)

## 2010-12-11 LAB — ABO/RH: ABO/RH(D): O POS

## 2010-12-11 LAB — MAGNESIUM
Magnesium: 1.9 mg/dL (ref 1.5–2.5)
Magnesium: 2.3 mg/dL (ref 1.5–2.5)

## 2011-01-07 ENCOUNTER — Encounter (HOSPITAL_COMMUNITY): Payer: Self-pay | Admitting: *Deleted

## 2011-01-07 ENCOUNTER — Emergency Department (HOSPITAL_COMMUNITY)
Admission: EM | Admit: 2011-01-07 | Discharge: 2011-01-07 | Disposition: A | Discharge status: Home / Self Care | Payer: Medicare Other | Attending: Emergency Medicine | Admitting: Emergency Medicine

## 2011-01-07 DIAGNOSIS — F329 Major depressive disorder, single episode, unspecified: Secondary | ICD-10-CM | POA: Insufficient documentation

## 2011-01-07 DIAGNOSIS — L309 Dermatitis, unspecified: Secondary | ICD-10-CM

## 2011-01-07 DIAGNOSIS — G8220 Paraplegia, unspecified: Secondary | ICD-10-CM | POA: Insufficient documentation

## 2011-01-07 DIAGNOSIS — B1920 Unspecified viral hepatitis C without hepatic coma: Secondary | ICD-10-CM | POA: Insufficient documentation

## 2011-01-07 DIAGNOSIS — Z87891 Personal history of nicotine dependence: Secondary | ICD-10-CM | POA: Insufficient documentation

## 2011-01-07 DIAGNOSIS — L8990 Pressure ulcer of unspecified site, unspecified stage: Secondary | ICD-10-CM | POA: Insufficient documentation

## 2011-01-07 DIAGNOSIS — M79609 Pain in unspecified limb: Secondary | ICD-10-CM | POA: Insufficient documentation

## 2011-01-07 DIAGNOSIS — IMO0001 Reserved for inherently not codable concepts without codable children: Secondary | ICD-10-CM | POA: Insufficient documentation

## 2011-01-07 DIAGNOSIS — L89109 Pressure ulcer of unspecified part of back, unspecified stage: Secondary | ICD-10-CM | POA: Insufficient documentation

## 2011-01-07 DIAGNOSIS — F411 Generalized anxiety disorder: Secondary | ICD-10-CM | POA: Insufficient documentation

## 2011-01-07 DIAGNOSIS — G8929 Other chronic pain: Secondary | ICD-10-CM | POA: Insufficient documentation

## 2011-01-07 DIAGNOSIS — F3289 Other specified depressive episodes: Secondary | ICD-10-CM | POA: Insufficient documentation

## 2011-01-07 DIAGNOSIS — Z8614 Personal history of Methicillin resistant Staphylococcus aureus infection: Secondary | ICD-10-CM | POA: Insufficient documentation

## 2011-01-07 DIAGNOSIS — R209 Unspecified disturbances of skin sensation: Secondary | ICD-10-CM | POA: Insufficient documentation

## 2011-01-07 DIAGNOSIS — M549 Dorsalgia, unspecified: Secondary | ICD-10-CM | POA: Insufficient documentation

## 2011-01-07 DIAGNOSIS — R21 Rash and other nonspecific skin eruption: Secondary | ICD-10-CM | POA: Insufficient documentation

## 2011-01-07 HISTORY — DX: Methicillin resistant Staphylococcus aureus infection, unspecified site: A49.02

## 2011-01-07 HISTORY — DX: Unspecified viral hepatitis C without hepatic coma: B19.20

## 2011-01-07 MED ORDER — OXYCODONE-ACETAMINOPHEN 10-650 MG PO TABS: 1.0000 | ORAL_TABLET | Freq: Four times a day (QID) | ORAL | Status: AC | PRN

## 2011-01-07 MED ORDER — HYDROXYZINE PAMOATE 25 MG PO CAPS: 25.0000 mg | ORAL_CAPSULE | Freq: Four times a day (QID) | ORAL | Status: AC | PRN

## 2011-01-07 MED ORDER — TRIAMCINOLONE ACETONIDE 0.1 % EX CREA: TOPICAL_CREAM | Freq: Two times a day (BID) | CUTANEOUS | Status: DC

## 2011-01-07 NOTE — ED Provider Notes (Signed)
Medical screening examination/treatment/procedure(s) were performed by non-physician practitioner and as supervising physician I was immediately available for consultation/collaboration.  Pacey Altizer, MD 01/07/11 1059 
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
## 2011-01-07 NOTE — ED Notes (Signed)
Pt states left leg pain (chronic) due to back injury. Pt is on methadone and ran out yesterday. Dr. Janna Arch is not working today. Pt states he needs something for pain and also states a rash to right abdomen x 3 mo.

## 2011-02-11 ENCOUNTER — Other Ambulatory Visit (HOSPITAL_COMMUNITY): Payer: Self-pay | Admitting: Family Medicine

## 2011-02-11 ENCOUNTER — Ambulatory Visit (HOSPITAL_COMMUNITY)
Admission: RE | Admit: 2011-02-11 | Discharge: 2011-02-11 | Disposition: A | Discharge status: Home / Self Care | Payer: Medicare Other | Source: Ambulatory Visit | Attending: Family Medicine | Admitting: Family Medicine

## 2011-02-11 DIAGNOSIS — M25519 Pain in unspecified shoulder: Secondary | ICD-10-CM | POA: Insufficient documentation

## 2011-02-11 DIAGNOSIS — M1990 Unspecified osteoarthritis, unspecified site: Secondary | ICD-10-CM

## 2011-02-11 DIAGNOSIS — T148XXA Other injury of unspecified body region, initial encounter: Secondary | ICD-10-CM

## 2011-03-05 ENCOUNTER — Encounter (INDEPENDENT_AMBULATORY_CARE_PROVIDER_SITE_OTHER): Payer: Self-pay | Admitting: *Deleted

## 2011-03-16 DIAGNOSIS — L89314 Pressure ulcer of right buttock, stage 4: Secondary | ICD-10-CM | POA: Insufficient documentation

## 2011-03-23 ENCOUNTER — Encounter (INDEPENDENT_AMBULATORY_CARE_PROVIDER_SITE_OTHER): Payer: Self-pay

## 2011-03-23 ENCOUNTER — Ambulatory Visit (INDEPENDENT_AMBULATORY_CARE_PROVIDER_SITE_OTHER): Payer: Medicare Other | Admitting: Internal Medicine

## 2011-03-25 ENCOUNTER — Encounter (INDEPENDENT_AMBULATORY_CARE_PROVIDER_SITE_OTHER): Payer: Self-pay | Admitting: *Deleted

## 2011-03-31 ENCOUNTER — Encounter (INDEPENDENT_AMBULATORY_CARE_PROVIDER_SITE_OTHER): Payer: Self-pay | Admitting: *Deleted

## 2011-04-14 DIAGNOSIS — G8929 Other chronic pain: Secondary | ICD-10-CM | POA: Insufficient documentation

## 2011-04-14 DIAGNOSIS — E46 Unspecified protein-calorie malnutrition: Secondary | ICD-10-CM | POA: Insufficient documentation

## 2011-05-10 ENCOUNTER — Ambulatory Visit (INDEPENDENT_AMBULATORY_CARE_PROVIDER_SITE_OTHER): Payer: Medicare Other | Admitting: Internal Medicine

## 2011-05-10 ENCOUNTER — Encounter (INDEPENDENT_AMBULATORY_CARE_PROVIDER_SITE_OTHER): Payer: Self-pay | Admitting: Internal Medicine

## 2011-05-10 VITALS — BP 120/74 | HR 70 | Temp 97.8°F | Resp 14 | Ht 69.0 in | Wt 140.0 lb

## 2011-05-10 DIAGNOSIS — L8990 Pressure ulcer of unspecified site, unspecified stage: Secondary | ICD-10-CM | POA: Insufficient documentation

## 2011-05-10 DIAGNOSIS — B182 Chronic viral hepatitis C: Secondary | ICD-10-CM | POA: Insufficient documentation

## 2011-05-10 NOTE — Consult Note (Deleted)
Dictated; [phone]

## 2011-05-10 NOTE — Patient Instructions (Addendum)
Physician will contact you with the results of blood tests and ultrasound.
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
## 2011-05-12 ENCOUNTER — Ambulatory Visit (HOSPITAL_COMMUNITY)
Admission: RE | Admit: 2011-05-12 | Discharge: 2011-05-12 | Disposition: A | Discharge status: Home / Self Care | Payer: Medicare Other | Source: Ambulatory Visit | Attending: Internal Medicine | Admitting: Internal Medicine

## 2011-05-12 ENCOUNTER — Other Ambulatory Visit (INDEPENDENT_AMBULATORY_CARE_PROVIDER_SITE_OTHER): Payer: Self-pay | Admitting: Internal Medicine

## 2011-05-12 DIAGNOSIS — Z09 Encounter for follow-up examination after completed treatment for conditions other than malignant neoplasm: Secondary | ICD-10-CM | POA: Insufficient documentation

## 2011-05-12 DIAGNOSIS — B182 Chronic viral hepatitis C: Secondary | ICD-10-CM | POA: Insufficient documentation

## 2011-05-22 LAB — LIVER FIBROSIS PANEL
Age Alone: 50
Alpha 2-Macroglobulins, Qn: 265 mg/dL (ref 106–279)
GGT: 41 U/L (ref 3–95)
Hyaluronic Acid: 19 ng/mL (ref 5–57)
METAVIR F0-F1: 83.3 %
METAVIR F4: 1.4 %

## 2011-05-28 ENCOUNTER — Encounter (INDEPENDENT_AMBULATORY_CARE_PROVIDER_SITE_OTHER): Payer: Self-pay

## 2011-12-16 ENCOUNTER — Encounter (INDEPENDENT_AMBULATORY_CARE_PROVIDER_SITE_OTHER): Payer: Self-pay | Admitting: *Deleted

## 2011-12-29 ENCOUNTER — Ambulatory Visit (INDEPENDENT_AMBULATORY_CARE_PROVIDER_SITE_OTHER): Payer: Medicare Other | Admitting: Internal Medicine

## 2012-01-04 ENCOUNTER — Ambulatory Visit (INDEPENDENT_AMBULATORY_CARE_PROVIDER_SITE_OTHER): Payer: Medicare Other | Admitting: Internal Medicine

## 2012-01-10 ENCOUNTER — Ambulatory Visit (HOSPITAL_COMMUNITY): Payer: Medicare Other | Admitting: Specialist

## 2012-01-25 ENCOUNTER — Ambulatory Visit (HOSPITAL_COMMUNITY)
Admission: RE | Admit: 2012-01-25 | Discharge: 2012-01-25 | Disposition: A | Discharge status: Home / Self Care | Payer: Medicare Other | Source: Ambulatory Visit | Attending: Family Medicine | Admitting: Family Medicine

## 2012-01-25 DIAGNOSIS — IMO0001 Reserved for inherently not codable concepts without codable children: Secondary | ICD-10-CM | POA: Insufficient documentation

## 2012-01-25 DIAGNOSIS — G8310 Monoplegia of lower limb affecting unspecified side: Secondary | ICD-10-CM | POA: Insufficient documentation

## 2012-01-25 DIAGNOSIS — R209 Unspecified disturbances of skin sensation: Secondary | ICD-10-CM | POA: Insufficient documentation

## 2012-01-25 DIAGNOSIS — M545 Low back pain, unspecified: Secondary | ICD-10-CM | POA: Insufficient documentation

## 2012-01-25 DIAGNOSIS — IMO0002 Reserved for concepts with insufficient information to code with codable children: Secondary | ICD-10-CM | POA: Insufficient documentation
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
## 2012-01-31 ENCOUNTER — Encounter (HOSPITAL_COMMUNITY): Payer: Self-pay | Admitting: Emergency Medicine

## 2012-01-31 ENCOUNTER — Emergency Department (HOSPITAL_COMMUNITY)
Admission: EM | Admit: 2012-01-31 | Discharge: 2012-01-31 | Disposition: A | Discharge status: Home / Self Care | Payer: Medicare Other | Attending: Emergency Medicine | Admitting: Emergency Medicine

## 2012-01-31 ENCOUNTER — Emergency Department (HOSPITAL_COMMUNITY): Payer: Medicare Other

## 2012-01-31 DIAGNOSIS — Z8619 Personal history of other infectious and parasitic diseases: Secondary | ICD-10-CM | POA: Insufficient documentation

## 2012-01-31 DIAGNOSIS — Z87891 Personal history of nicotine dependence: Secondary | ICD-10-CM | POA: Insufficient documentation

## 2012-01-31 DIAGNOSIS — L89899 Pressure ulcer of other site, unspecified stage: Secondary | ICD-10-CM | POA: Insufficient documentation

## 2012-01-31 DIAGNOSIS — R071 Chest pain on breathing: Secondary | ICD-10-CM | POA: Insufficient documentation

## 2012-01-31 DIAGNOSIS — F329 Major depressive disorder, single episode, unspecified: Secondary | ICD-10-CM | POA: Insufficient documentation

## 2012-01-31 DIAGNOSIS — R0789 Other chest pain: Secondary | ICD-10-CM

## 2012-01-31 DIAGNOSIS — F3289 Other specified depressive episodes: Secondary | ICD-10-CM | POA: Insufficient documentation

## 2012-01-31 DIAGNOSIS — Z79899 Other long term (current) drug therapy: Secondary | ICD-10-CM | POA: Insufficient documentation

## 2012-01-31 DIAGNOSIS — R51 Headache: Secondary | ICD-10-CM | POA: Insufficient documentation

## 2012-01-31 DIAGNOSIS — G8220 Paraplegia, unspecified: Secondary | ICD-10-CM | POA: Insufficient documentation

## 2012-01-31 DIAGNOSIS — Z8614 Personal history of Methicillin resistant Staphylococcus aureus infection: Secondary | ICD-10-CM | POA: Insufficient documentation

## 2012-01-31 DIAGNOSIS — R0602 Shortness of breath: Secondary | ICD-10-CM | POA: Insufficient documentation

## 2012-01-31 DIAGNOSIS — F411 Generalized anxiety disorder: Secondary | ICD-10-CM | POA: Insufficient documentation

## 2012-01-31 DIAGNOSIS — Z862 Personal history of diseases of the blood and blood-forming organs and certain disorders involving the immune mechanism: Secondary | ICD-10-CM | POA: Insufficient documentation

## 2012-01-31 HISTORY — DX: Paralytic syndrome, unspecified: G83.9

## 2012-01-31 LAB — BASIC METABOLIC PANEL
Calcium: 9.6 mg/dL (ref 8.4–10.5)
GFR calc Af Amer: >90 mL/min (ref >90)
GFR calc non Af Amer: >90 mL/min (ref >90)
Glucose, Bld: 93 mg/dL (ref 70–99)
Potassium: 4.3 mEq/L (ref 3.5–5.1)
Sodium: 135 mEq/L (ref 135–145)

## 2012-01-31 LAB — CBC WITH DIFFERENTIAL/PLATELET
Basophils Absolute: 0 10*3/uL (ref 0.0–0.1)
Basophils Relative: 0 % (ref 0–1)
Eosinophils Absolute: 0.1 10*3/uL (ref 0.0–0.7)
Eosinophils Relative: 2 % (ref 0–5)
MCH: 28.2 pg (ref 26.0–34.0)
MCHC: 33.9 g/dL (ref 30.0–36.0)
MCV: 83.3 fL (ref 78.0–100.0)
Neutrophils Relative %: 59 % (ref 43–77)
Platelets: 211 10*3/uL (ref 150–400)
RDW: 14.3 % (ref 11.5–15.5)

## 2012-01-31 LAB — D-DIMER, QUANTITATIVE: D-Dimer, Quant: <0.27 ug/mL-FEU (ref 0.00–0.48)

## 2012-01-31 MED ORDER — HYDROCODONE-ACETAMINOPHEN 5-325 MG PO TABS
2.0000 | ORAL_TABLET | Freq: Once | ORAL | Status: AC
  Administered 2012-01-31: 2 via ORAL
  Filled 2012-01-31: qty 2

## 2012-01-31 NOTE — ED Notes (Signed)
Pt reports headache for 1 month, comes and goes. Also having mild pain in his chest for 1 week, pain is constant, sob at times, denies any nausea or vomiting. Pt is bedridden. And stated he has a bedsore from "sitting around too much".  Being treated at [facility] for same, drove self to er on hover-round.
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
## 2012-01-31 NOTE — ED Notes (Signed)
Pt states headache for one month and has had SOB and pain with deep breath for one week.

## 2012-02-15 ENCOUNTER — Emergency Department (HOSPITAL_COMMUNITY)
Admission: EM | Admit: 2012-02-15 | Discharge: 2012-02-15 | Disposition: A | Discharge status: Home / Self Care | Payer: Medicare Other | Attending: Emergency Medicine | Admitting: Emergency Medicine

## 2012-02-15 ENCOUNTER — Encounter (HOSPITAL_COMMUNITY): Payer: Self-pay

## 2012-02-15 ENCOUNTER — Emergency Department (HOSPITAL_COMMUNITY): Payer: Medicare Other

## 2012-02-15 DIAGNOSIS — Z79899 Other long term (current) drug therapy: Secondary | ICD-10-CM | POA: Insufficient documentation

## 2012-02-15 DIAGNOSIS — G8220 Paraplegia, unspecified: Secondary | ICD-10-CM | POA: Insufficient documentation

## 2012-02-15 DIAGNOSIS — Z8659 Personal history of other mental and behavioral disorders: Secondary | ICD-10-CM | POA: Insufficient documentation

## 2012-02-15 DIAGNOSIS — Z862 Personal history of diseases of the blood and blood-forming organs and certain disorders involving the immune mechanism: Secondary | ICD-10-CM | POA: Insufficient documentation

## 2012-02-15 DIAGNOSIS — G8929 Other chronic pain: Secondary | ICD-10-CM | POA: Insufficient documentation

## 2012-02-15 DIAGNOSIS — Z8614 Personal history of Methicillin resistant Staphylococcus aureus infection: Secondary | ICD-10-CM | POA: Insufficient documentation

## 2012-02-15 DIAGNOSIS — Z8619 Personal history of other infectious and parasitic diseases: Secondary | ICD-10-CM | POA: Insufficient documentation

## 2012-02-15 DIAGNOSIS — R51 Headache: Secondary | ICD-10-CM | POA: Insufficient documentation

## 2012-02-15 DIAGNOSIS — Z87891 Personal history of nicotine dependence: Secondary | ICD-10-CM | POA: Insufficient documentation

## 2012-02-15 MED ORDER — BUTALBITAL-APAP-CAFFEINE 50-325-40 MG PO TABS: 1.0000 | ORAL_TABLET | Freq: Four times a day (QID) | ORAL | Status: DC | PRN

## 2012-02-15 NOTE — ED Notes (Signed)
Pt reports sinus and nasal pressure for "a while"

## 2012-02-15 NOTE — ED Notes (Signed)
J Idol in to see pt

## 2012-02-15 NOTE — ED Notes (Signed)
Patient transported to CT 

## 2012-02-16 NOTE — ED Provider Notes (Signed)
History     CSN: [id number]  Arrival date & time 02/15/12  7829   First MD Initiated Contact with Patient 02/15/12 1023      Chief Complaint  Patient presents with  . Facial Pain    (Consider location/radiation/quality/duration/timing/severity/associated sxs/prior treatment) HPI Comments: [name] presents with chronic,  Non relenting headache which has been present for at least the last month.  He describes a tight band of pain around his entire head with no recognized alleviators or aggravators.  He denies fevers, chills,  Neck pain or stiffness and denies any injury or fall.  Additionally,  He does not have light sensitivity, nausea, vomiting,  Dizziness, tinnitus, visual changes, nasal congestion, sinus pressure or new focal weakness.  He is a paraplegic at baseline.  He takes methadone daily which does not relieve his headache. He has tried no other headache medicines.  He has not seen his pcp for this complaint.  The history is provided by the patient.    Past Medical History  Diagnosis Date  . Paraparesis     parapalegic   . Depression   . Anxiety   . Hepatitis C   . MRSA infection   . Anemia     Patient states that he was recently told this  . Paralysis     Past Surgical History  Procedure Date  . Back surgery   . Gallbladder surgery 2010  . Femur fracture surgery March 2012    retrograde intramedullary femoral nail  . Hernia repair   . Cholecystectomy     Family History  Problem Relation Age of Onset  . Cancer      family history   . Ovarian cancer Mother   . Healthy Sister   . Hodgkin's lymphoma Sister   . Heart disease Sister   . Healthy Sister   . Healthy Son   . Healthy Daughter   . Anemia Daughter   . Thyroid disease Daughter     History  Substance Use Topics  . Smoking status: Former Smoker -- 1.0 packs/day for 5 years    Types: Cigarettes    Quit date: 08/10/2010  . Smokeless tobacco: Never Used  . Alcohol Use: No     Comment:  Patient quit drinking 1 year ago      Review of Systems  Constitutional: Negative for fever.  HENT: Negative for congestion, sore throat, neck pain and neck stiffness.   Eyes: Negative.  Negative for photophobia and visual disturbance.  Respiratory: Negative for chest tightness and shortness of breath.   Cardiovascular: Negative for chest pain.  Gastrointestinal: Negative for nausea and abdominal pain.  Genitourinary: Negative.   Musculoskeletal: Negative for joint swelling and arthralgias.  Skin: Negative.  Negative for rash and wound.  Neurological: Positive for headaches. Negative for dizziness, weakness, light-headedness and numbness.  Hematological: Negative.   Psychiatric/Behavioral: Negative.     Allergies  Review of patient's allergies indicates no known allergies.  Home Medications   Current Outpatient Rx  Name  Route  Sig  Dispense  Refill  . METHADONE HCL 10 MG PO TABS   Oral   Take 60 mg by mouth daily. For pain         . PREGABALIN 75 MG PO CAPS   Oral   Take 75 mg by mouth 3 (three) times daily.          [name] ZOLPIDEM TARTRATE 5 MG PO TABS   Oral   Take 5 mg by mouth at  bedtime.          [name] BUTALBITAL-APAP-CAFFEINE 50-325-40 MG PO TABS   Oral   Take 1-2 tablets by mouth every 6 (six) hours as needed for headache.   20 tablet   0     BP 125/82  Pulse 74  Temp 97.5 F (36.4 C) (Oral)  Resp 20  SpO2 100%  Physical Exam  Nursing note and vitals reviewed. Constitutional: He is oriented to person, place, and time. He appears well-developed and well-nourished.       Uncomfortable appearing  HENT:  Head: Normocephalic and atraumatic.  Nose: Right sinus exhibits no maxillary sinus tenderness and no frontal sinus tenderness. Left sinus exhibits no maxillary sinus tenderness and no frontal sinus tenderness.  Mouth/Throat: Oropharynx is clear and moist.  Eyes: EOM are normal. Pupils are equal, round, and reactive to light.  Neck: Normal range of  motion. Neck supple.  Cardiovascular: Normal rate and normal heart sounds.   Pulmonary/Chest: Effort normal.  Abdominal: Soft. There is no tenderness.  Musculoskeletal: Normal range of motion.  Lymphadenopathy:    He has no cervical adenopathy.  Neurological: He is alert and oriented to person, place, and time. He has normal strength. No sensory deficit. GCS eye subscore is 4. GCS verbal subscore is 5. GCS motor subscore is 6.       Normal rapid alternating movements. Cranial nerves III-XII intact.  No pronator drift.  Pt is paraplegic.  Skin: Skin is warm and dry. No rash noted.  Psychiatric: His speech is normal and behavior is normal. Thought content normal. His mood appears anxious. Cognition and memory are normal.    ED Course  Procedures (including critical care time)  Labs Reviewed - No data to display Ct Head Wo Contrast  02/15/2012  *RADIOLOGY REPORT*  Clinical Data: Persistent headache for 2 months time.  CT HEAD WITHOUT CONTRAST  Technique:  Contiguous axial images were obtained from the base of the skull through the vertex without contrast.  Comparison: None.  Findings: The ventricles are normal in size and configuration. There is no mass, hemorrhage, extra-axial fluid collection, or midline shift.  Gray-white compartments are normal.  Bony calvarium appears intact.  Mastoid air cells are clear.  IMPRESSION: Study within normal limits.   Original Report Authenticated By: Bretta Bang, M.D.      1. Headache       MDM  Head Ct scan reviewed and discussed with patient.  Pt's exam is negative for any red flag findings suggesting intracranial or infectious process.  Suspect tension headache given headache pattern.  Will give trial of fioricet.  Pt advised f/u with pcp prn sx not improved with this med.  Note that nursing notes reviewed,  But pt adamantly denies sinus and nasal sx.        Burgess Amor, PA 02/16/12 0830

## 2012-02-17 NOTE — ED Provider Notes (Signed)
Medical screening examination/treatment/procedure(s) were performed by non-physician practitioner and as supervising physician I was immediately available for consultation/collaboration.  Jones Skene, M.D.     Jones Skene, MD 02/17/12 1454

## 2012-06-28 ENCOUNTER — Encounter (HOSPITAL_COMMUNITY): Payer: Self-pay

## 2012-06-28 ENCOUNTER — Emergency Department (HOSPITAL_COMMUNITY)
Admission: EM | Admit: 2012-06-28 | Discharge: 2012-06-28 | Disposition: A | Discharge status: Home / Self Care | Payer: Medicare Other | Attending: Emergency Medicine | Admitting: Emergency Medicine

## 2012-06-28 ENCOUNTER — Emergency Department (HOSPITAL_COMMUNITY): Payer: Medicare Other

## 2012-06-28 DIAGNOSIS — Z8659 Personal history of other mental and behavioral disorders: Secondary | ICD-10-CM | POA: Insufficient documentation

## 2012-06-28 DIAGNOSIS — Z8614 Personal history of Methicillin resistant Staphylococcus aureus infection: Secondary | ICD-10-CM | POA: Insufficient documentation

## 2012-06-28 DIAGNOSIS — M25511 Pain in right shoulder: Secondary | ICD-10-CM

## 2012-06-28 DIAGNOSIS — S4980XA Other specified injuries of shoulder and upper arm, unspecified arm, initial encounter: Secondary | ICD-10-CM | POA: Insufficient documentation

## 2012-06-28 DIAGNOSIS — S52123A Displaced fracture of head of unspecified radius, initial encounter for closed fracture: Secondary | ICD-10-CM | POA: Insufficient documentation

## 2012-06-28 DIAGNOSIS — S46909A Unspecified injury of unspecified muscle, fascia and tendon at shoulder and upper arm level, unspecified arm, initial encounter: Secondary | ICD-10-CM | POA: Insufficient documentation

## 2012-06-28 DIAGNOSIS — S139XXA Sprain of joints and ligaments of unspecified parts of neck, initial encounter: Secondary | ICD-10-CM | POA: Insufficient documentation

## 2012-06-28 DIAGNOSIS — S52121A Displaced fracture of head of right radius, initial encounter for closed fracture: Secondary | ICD-10-CM

## 2012-06-28 DIAGNOSIS — S8002XA Contusion of left knee, initial encounter: Secondary | ICD-10-CM

## 2012-06-28 DIAGNOSIS — Z9889 Other specified postprocedural states: Secondary | ICD-10-CM | POA: Insufficient documentation

## 2012-06-28 DIAGNOSIS — Z79899 Other long term (current) drug therapy: Secondary | ICD-10-CM | POA: Insufficient documentation

## 2012-06-28 DIAGNOSIS — G8220 Paraplegia, unspecified: Secondary | ICD-10-CM | POA: Insufficient documentation

## 2012-06-28 DIAGNOSIS — Z8619 Personal history of other infectious and parasitic diseases: Secondary | ICD-10-CM | POA: Insufficient documentation

## 2012-06-28 DIAGNOSIS — S8000XA Contusion of unspecified knee, initial encounter: Secondary | ICD-10-CM | POA: Insufficient documentation

## 2012-06-28 DIAGNOSIS — Z862 Personal history of diseases of the blood and blood-forming organs and certain disorders involving the immune mechanism: Secondary | ICD-10-CM | POA: Insufficient documentation

## 2012-06-28 DIAGNOSIS — Z87891 Personal history of nicotine dependence: Secondary | ICD-10-CM | POA: Insufficient documentation

## 2012-06-28 MED ORDER — ALBUTEROL SULFATE HFA 108 (90 BASE) MCG/ACT IN AERS
INHALATION_SPRAY | RESPIRATORY_TRACT | Status: AC
  Filled 2012-06-28: qty 6.7

## 2012-06-28 MED ORDER — OXYCODONE-ACETAMINOPHEN 5-325 MG PO TABS: 1.0000 | ORAL_TABLET | ORAL | Status: DC | PRN
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
## 2012-07-05 ENCOUNTER — Encounter: Payer: Self-pay | Admitting: Orthopedic Surgery

## 2012-07-05 ENCOUNTER — Ambulatory Visit (INDEPENDENT_AMBULATORY_CARE_PROVIDER_SITE_OTHER): Payer: Medicare Other | Admitting: Orthopedic Surgery

## 2012-07-05 VITALS — BP 120/80 | Ht 69.0 in | Wt 130.0 lb

## 2012-07-05 DIAGNOSIS — S52123A Displaced fracture of head of unspecified radius, initial encounter for closed fracture: Secondary | ICD-10-CM

## 2012-07-05 DIAGNOSIS — S52121A Displaced fracture of head of right radius, initial encounter for closed fracture: Secondary | ICD-10-CM | POA: Insufficient documentation

## 2012-07-05 MED ORDER — OXYCODONE-ACETAMINOPHEN 5-325 MG PO TABS: 1.0000 | ORAL_TABLET | ORAL | Status: DC | PRN

## 2012-07-05 NOTE — Patient Instructions (Addendum)
You have a hairline fracture of your elbow.  It will heal without surgery   Make sure you are bending and straightening your elbow several times a day   Activities as tolerated   Take the percocet for pain   Continue your regular methadone   Radial Head Fracture A radial head fracture is a break of the smaller bone (radius) in the forearm. The head of this bone is the part near the elbow. These fractures commonly happen during a fall when you land on the outstretched arm. These fractures are more common in middle aged adults and are common with a dislocation of the elbow. SYMPTOMS   Swelling of the elbow joint and pain on the outside of the elbow.  Pain and difficulty in bending or straightening the elbow.  Pain and difficulty in turning the palm of the hand up or down with the elbow bent. DIAGNOSIS  Your caregiver may make this diagnosis by a physical exam. X-rays can confirm the type and amount of break. Sometimes a break which is not displaced cannot be seen on the original x-ray. TREATMENT  Radial head fractures are classified according to the amount of movement (displacement) of parts from the normal position.  Type 1 Fractures  Type 1 fractures are generally small fractures in which bone pieces remain together (non-displaced fracture).  The fracture may not be seen on initial X-rays. Usually if x-rays are repeated two to three weeks later, the fracture will show up. A splint or sling is used for a few days. Gentle early motion is used to prevent the elbow from becoming stiff. It should not be done vigorously or forced as this could displace the bone pieces. HOME CARE INSTRUCTIONS   Keep the injured part elevated while sitting or lying down. Keep the injury above the level of your heart (the center of the chest). This will decrease swelling and pain.  Apply ice to the injury for 15 to 20 minutes, 3 to 4 times per day while awake, for 2 days. Put the ice in a plastic bag and  place a towel between the bag of ice and your cast or splint.  Move your fingers to avoid stiffness and minimize swelling.  Follow all instructions for follow up with your caregiver. This includes any orthopedic referrals, physical therapy and rehabilitation. Any delay in obtaining necessary care could result in a delay or failure of the bones to heal or permanent elbow stiffness.  Do not over do exercises. This could further damage your injury. SEEK IMMEDIATE MEDICAL CARE IF:   You have more severe pain or swelling than you did before getting the cast.  You have severe pain when stretching your fingers.  Your fingers or hand turn pale or blue, become cold, or you lose feeling. Document Released: 12/14/2005 Document Revised: 05/17/2011 Document Reviewed: 01/21/2009 [facility] Patient Information 2013 [place], [place].
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
## 2012-07-10 ENCOUNTER — Ambulatory Visit: Payer: Medicare Other | Admitting: Orthopedic Surgery

## 2012-08-03 ENCOUNTER — Emergency Department (HOSPITAL_COMMUNITY): Payer: Medicare Other

## 2012-08-03 ENCOUNTER — Inpatient Hospital Stay (HOSPITAL_COMMUNITY): Payer: Medicare Other

## 2012-08-03 ENCOUNTER — Ambulatory Visit: Payer: Medicare Other | Admitting: Orthopedic Surgery

## 2012-08-03 ENCOUNTER — Inpatient Hospital Stay (HOSPITAL_COMMUNITY)
Admission: EM | Admit: 2012-08-03 | Discharge: 2012-08-06 | DRG: 481 | Disposition: A | Discharge status: Home Health | Payer: Medicare Other | Attending: Orthopaedic Surgery | Admitting: Orthopaedic Surgery

## 2012-08-03 ENCOUNTER — Other Ambulatory Visit (HOSPITAL_COMMUNITY): Payer: Self-pay | Admitting: Orthopaedic Surgery

## 2012-08-03 ENCOUNTER — Encounter (HOSPITAL_COMMUNITY): Payer: Self-pay | Admitting: *Deleted

## 2012-08-03 DIAGNOSIS — S72453A Displaced supracondylar fracture without intracondylar extension of lower end of unspecified femur, initial encounter for closed fracture: Principal | ICD-10-CM | POA: Diagnosis present

## 2012-08-03 DIAGNOSIS — W050XXA Fall from non-moving wheelchair, initial encounter: Secondary | ICD-10-CM | POA: Diagnosis present

## 2012-08-03 DIAGNOSIS — F329 Major depressive disorder, single episode, unspecified: Secondary | ICD-10-CM | POA: Diagnosis present

## 2012-08-03 DIAGNOSIS — Z8614 Personal history of Methicillin resistant Staphylococcus aureus infection: Secondary | ICD-10-CM

## 2012-08-03 DIAGNOSIS — Z8249 Family history of ischemic heart disease and other diseases of the circulatory system: Secondary | ICD-10-CM

## 2012-08-03 DIAGNOSIS — F3289 Other specified depressive episodes: Secondary | ICD-10-CM | POA: Diagnosis present

## 2012-08-03 DIAGNOSIS — G8929 Other chronic pain: Secondary | ICD-10-CM | POA: Diagnosis present

## 2012-08-03 DIAGNOSIS — Y92009 Unspecified place in unspecified non-institutional (private) residence as the place of occurrence of the external cause: Secondary | ICD-10-CM

## 2012-08-03 DIAGNOSIS — G8220 Paraplegia, unspecified: Secondary | ICD-10-CM | POA: Diagnosis present

## 2012-08-03 DIAGNOSIS — S72451A Displaced supracondylar fracture without intracondylar extension of lower end of right femur, initial encounter for closed fracture: Secondary | ICD-10-CM

## 2012-08-03 DIAGNOSIS — Z807 Family history of other malignant neoplasms of lymphoid, hematopoietic and related tissues: Secondary | ICD-10-CM

## 2012-08-03 DIAGNOSIS — F411 Generalized anxiety disorder: Secondary | ICD-10-CM | POA: Diagnosis present

## 2012-08-03 DIAGNOSIS — S7291XA Unspecified fracture of right femur, initial encounter for closed fracture: Secondary | ICD-10-CM

## 2012-08-03 DIAGNOSIS — B1920 Unspecified viral hepatitis C without hepatic coma: Secondary | ICD-10-CM | POA: Diagnosis present

## 2012-08-03 DIAGNOSIS — Z87891 Personal history of nicotine dependence: Secondary | ICD-10-CM

## 2012-08-03 LAB — CBC
HCT: 38.9 % — ABNORMAL LOW (ref 39.0–52.0)
HCT: 31.3 % — ABNORMAL LOW (ref 39.0–52.0)
Hemoglobin: 13.2 g/dL (ref 13.0–17.0)
Hemoglobin: 10.5 g/dL — ABNORMAL LOW (ref 13.0–17.0)
MCHC: 33.5 g/dL (ref 30.0–36.0)
MCV: 82.9 fL (ref 78.0–100.0)
RBC: 4.69 MIL/uL (ref 4.22–5.81)
WBC: 11 10*3/uL — ABNORMAL HIGH (ref 4.0–10.5)

## 2012-08-03 LAB — BASIC METABOLIC PANEL
BUN: 9 mg/dL (ref 6–23)
CO2: 24 mEq/L (ref 19–32)
Chloride: 100 mEq/L (ref 96–112)
Creatinine, Ser: 0.6 mg/dL (ref 0.50–1.35)
GFR calc Af Amer: >90 mL/min (ref >90)
Glucose, Bld: 119 mg/dL — ABNORMAL HIGH (ref 70–99)
Potassium: 4.3 mEq/L (ref 3.5–5.1)

## 2012-08-03 MED ORDER — PREGABALIN 50 MG PO CAPS
75.0000 mg | ORAL_CAPSULE | Freq: Three times a day (TID) | ORAL | Status: DC
  Administered 2012-08-03 – 2012-08-06 (×8): 75 mg via ORAL
  Filled 2012-08-03 (×8): qty 1

## 2012-08-03 MED ORDER — MORPHINE SULFATE 2 MG/ML IJ SOLN: 0.5000 mg | INTRAMUSCULAR | Status: DC | PRN

## 2012-08-03 MED ORDER — METHADONE HCL 10 MG PO TABS
20.0000 mg | ORAL_TABLET | Freq: Three times a day (TID) | ORAL | Status: DC
  Administered 2012-08-03 – 2012-08-06 (×8): 20 mg via ORAL
  Filled 2012-08-03 (×8): qty 2

## 2012-08-03 MED ORDER — HYDROMORPHONE HCL PF 1 MG/ML IJ SOLN
1.0000 mg | Freq: Once | INTRAMUSCULAR | Status: AC
  Administered 2012-08-03: 1 mg via INTRAVENOUS
  Filled 2012-08-03: qty 1

## 2012-08-03 MED ORDER — HYDROCODONE-ACETAMINOPHEN 5-325 MG PO TABS
1.0000 | ORAL_TABLET | Freq: Four times a day (QID) | ORAL | Status: DC | PRN
  Administered 2012-08-03 – 2012-08-06 (×8): 2 via ORAL
  Filled 2012-08-03 (×10): qty 2

## 2012-08-03 MED ORDER — DEXTROSE-NACL 5-0.45 % IV SOLN: INTRAVENOUS | Status: DC

## 2012-08-03 MED ORDER — HYDROMORPHONE HCL PF 1 MG/ML IJ SOLN
0.5000 mg | INTRAMUSCULAR | Status: DC | PRN
  Administered 2012-08-03 – 2012-08-05 (×8): 0.5 mg via INTRAVENOUS
  Filled 2012-08-03 (×8): qty 1

## 2012-08-03 MED ORDER — HYDROMORPHONE HCL PF 1 MG/ML IJ SOLN: 2.0000 mg | Freq: Once | INTRAMUSCULAR | Status: DC

## 2012-08-03 MED ORDER — METHOCARBAMOL 500 MG PO TABS
500.0000 mg | ORAL_TABLET | Freq: Four times a day (QID) | ORAL | Status: DC | PRN
  Administered 2012-08-03 – 2012-08-06 (×9): 500 mg via ORAL
  Filled 2012-08-03 (×9): qty 1

## 2012-08-03 MED ORDER — ZOLPIDEM TARTRATE 5 MG PO TABS
5.0000 mg | ORAL_TABLET | Freq: Every evening | ORAL | Status: DC | PRN
  Administered 2012-08-03 – 2012-08-05 (×3): 5 mg via ORAL
  Filled 2012-08-03 (×3): qty 1

## 2012-08-03 MED ORDER — HYDROMORPHONE HCL PF 2 MG/ML IJ SOLN
2.0000 mg | Freq: Once | INTRAMUSCULAR | Status: AC
  Administered 2012-08-03: 2 mg via INTRAVENOUS
  Filled 2012-08-03: qty 1

## 2012-08-03 MED ORDER — HYDROMORPHONE HCL PF 1 MG/ML IJ SOLN
1.0000 mg | INTRAMUSCULAR | Status: DC | PRN
  Administered 2012-08-03: 1 mg via INTRAVENOUS
  Filled 2012-08-03: qty 1

## 2012-08-03 MED ORDER — SODIUM CHLORIDE 0.9 % IV SOLN
INTRAVENOUS | Status: DC
  Administered 2012-08-03: 22:00:00 via INTRAVENOUS

## 2012-08-03 MED ORDER — SODIUM CHLORIDE 0.9 % IV BOLUS (SEPSIS)
500.0000 mL | Freq: Once | INTRAVENOUS | Status: AC
  Administered 2012-08-03: 500 mL via INTRAVENOUS

## 2012-08-03 NOTE — ED Provider Notes (Signed)
Xray has been reviewed by dr Hilda Lias and he feels he needs to be transported to [facility] is awake/alert, no other signs of trauma to head/neck/back/abdomen or his other extremities  Awaiting call back from ortho in [place]  preop EKG/labs/imaging performed    Date: 08/03/2012  Rate: 90  Rhythm: normal sinus rhythm  QRS Axis: normal  Intervals: normal  ST/T Wave abnormalities: normal  Conduction Disutrbances:none  Narrative Interpretation:   Old EKG Reviewed: unchanged    Joya Gaskins, MD 08/03/12 [phone]

## 2012-08-03 NOTE — ED Notes (Signed)
Medicated per MD for ortho procedure.
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
## 2012-08-03 NOTE — ED Notes (Signed)
Report given to Maralyn Sago, RN 5 [facility].  Patient left ED at this time with [facility] staff.  No distress upon departure.

## 2012-08-03 NOTE — ED Provider Notes (Signed)
Patient seen/examined in the Emergency Department in conjunction with Resident Physician Provider Berline Chough Patient reports right knee pain s/p fall last night Exam : awake/alert.  Right knee tenderness, effusion noted.  Distal pulses intact.  Concern for acute fracture to distal femur Plan: imaging, pain meds and likely orthpedics consult   Joya Gaskins, MD 08/03/12 [phone]
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
## 2012-08-04 ENCOUNTER — Encounter (HOSPITAL_COMMUNITY)
Admission: EM | Disposition: A | Discharge status: Home Health | Payer: Self-pay | Source: Home / Self Care | Attending: Orthopaedic Surgery

## 2012-08-04 ENCOUNTER — Inpatient Hospital Stay (HOSPITAL_COMMUNITY): Payer: Medicare Other | Admitting: Certified Registered"

## 2012-08-04 ENCOUNTER — Encounter (HOSPITAL_COMMUNITY): Payer: Self-pay | Admitting: Anesthesiology

## 2012-08-04 ENCOUNTER — Inpatient Hospital Stay (HOSPITAL_COMMUNITY): Payer: Medicare Other

## 2012-08-04 ENCOUNTER — Encounter (HOSPITAL_COMMUNITY): Payer: Self-pay | Admitting: Certified Registered"

## 2012-08-04 HISTORY — PX: ORIF FEMUR FRACTURE: SHX2119

## 2012-08-04 LAB — COMPREHENSIVE METABOLIC PANEL
Alkaline Phosphatase: 76 U/L (ref 39–117)
BUN: 14 mg/dL (ref 6–23)
CO2: 27 mEq/L (ref 19–32)
Chloride: 98 mEq/L (ref 96–112)
GFR calc Af Amer: >90 mL/min (ref >90)
GFR calc non Af Amer: >90 mL/min (ref >90)
Glucose, Bld: 117 mg/dL — ABNORMAL HIGH (ref 70–99)
Potassium: 3.7 mEq/L (ref 3.5–5.1)
Total Bilirubin: 0.8 mg/dL (ref 0.3–1.2)
Total Protein: 6 g/dL (ref 6.0–8.3)

## 2012-08-04 SURGERY — OPEN REDUCTION INTERNAL FIXATION (ORIF) DISTAL FEMUR FRACTURE
Anesthesia: Choice | Site: Leg Upper | Laterality: Right | Wound class: Clean

## 2012-08-04 MED ORDER — DOCUSATE SODIUM 100 MG PO CAPS
100.0000 mg | ORAL_CAPSULE | Freq: Two times a day (BID) | ORAL | Status: DC
  Administered 2012-08-04: 100 mg via ORAL
  Filled 2012-08-04 (×5): qty 1

## 2012-08-04 MED ORDER — GLYCOPYRROLATE 0.2 MG/ML IJ SOLN
INTRAMUSCULAR | Status: DC | PRN
  Administered 2012-08-04: 0.2 mg via INTRAVENOUS
  Administered 2012-08-04: 0.6 mg via INTRAVENOUS

## 2012-08-04 MED ORDER — METOCLOPRAMIDE HCL 5 MG/ML IJ SOLN: 5.0000 mg | Freq: Three times a day (TID) | INTRAMUSCULAR | Status: DC | PRN

## 2012-08-04 MED ORDER — HYDROMORPHONE HCL PF 1 MG/ML IJ SOLN
0.2500 mg | INTRAMUSCULAR | Status: DC | PRN
  Administered 2012-08-04 (×2): 0.5 mg via INTRAVENOUS

## 2012-08-04 MED ORDER — CEFAZOLIN SODIUM-DEXTROSE 2-3 GM-% IV SOLR
2.0000 g | INTRAVENOUS | Status: AC
  Administered 2012-08-04: 2 g via INTRAVENOUS
  Filled 2012-08-04: qty 50

## 2012-08-04 MED ORDER — PREGABALIN 50 MG PO CAPS: 75.0000 mg | ORAL_CAPSULE | Freq: Three times a day (TID) | ORAL | Status: DC

## 2012-08-04 MED ORDER — ONDANSETRON HCL 4 MG/2ML IJ SOLN: 4.0000 mg | Freq: Four times a day (QID) | INTRAMUSCULAR | Status: DC | PRN

## 2012-08-04 MED ORDER — METHADONE HCL 10 MG PO TABS: 20.0000 mg | ORAL_TABLET | Freq: Three times a day (TID) | ORAL | Status: DC

## 2012-08-04 MED ORDER — SENNOSIDES-DOCUSATE SODIUM 8.6-50 MG PO TABS: 1.0000 | ORAL_TABLET | Freq: Every evening | ORAL | Status: DC | PRN

## 2012-08-04 MED ORDER — ONDANSETRON HCL 4 MG PO TABS: 4.0000 mg | ORAL_TABLET | Freq: Four times a day (QID) | ORAL | Status: DC | PRN

## 2012-08-04 MED ORDER — PROPOFOL 10 MG/ML IV BOLUS
INTRAVENOUS | Status: DC | PRN
  Administered 2012-08-04: 200 mg via INTRAVENOUS

## 2012-08-04 MED ORDER — ONDANSETRON HCL 4 MG/2ML IJ SOLN
INTRAMUSCULAR | Status: DC | PRN
  Administered 2012-08-04: 4 mg via INTRAVENOUS

## 2012-08-04 MED ORDER — NEOSTIGMINE METHYLSULFATE 1 MG/ML IJ SOLN
INTRAMUSCULAR | Status: DC | PRN
  Administered 2012-08-04: 4 mg via INTRAVENOUS

## 2012-08-04 MED ORDER — LIDOCAINE HCL (CARDIAC) 20 MG/ML IV SOLN
INTRAVENOUS | Status: DC | PRN
  Administered 2012-08-04: 60 mg via INTRAVENOUS

## 2012-08-04 MED ORDER — LIDOCAINE HCL 4 % MT SOLN
OROMUCOSAL | Status: DC | PRN
  Administered 2012-08-04: 4 mL via TOPICAL

## 2012-08-04 MED ORDER — 0.9 % SODIUM CHLORIDE (POUR BTL) OPTIME
TOPICAL | Status: DC | PRN
  Administered 2012-08-04: 1000 mL

## 2012-08-04 MED ORDER — LACTATED RINGERS IV SOLN
INTRAVENOUS | Status: DC | PRN
  Administered 2012-08-04: 10:00:00 via INTRAVENOUS

## 2012-08-04 MED ORDER — ROCURONIUM BROMIDE 100 MG/10ML IV SOLN
INTRAVENOUS | Status: DC | PRN
  Administered 2012-08-04: 30 mg via INTRAVENOUS

## 2012-08-04 MED ORDER — PHENYLEPHRINE HCL 10 MG/ML IJ SOLN
INTRAMUSCULAR | Status: DC | PRN
  Administered 2012-08-04 (×2): 40 ug via INTRAVENOUS

## 2012-08-04 MED ORDER — FENTANYL CITRATE 0.05 MG/ML IJ SOLN
INTRAMUSCULAR | Status: DC | PRN
  Administered 2012-08-04 (×2): 50 ug via INTRAVENOUS

## 2012-08-04 MED ORDER — METOCLOPRAMIDE HCL 10 MG PO TABS: 5.0000 mg | ORAL_TABLET | Freq: Three times a day (TID) | ORAL | Status: DC | PRN

## 2012-08-04 MED ORDER — BISACODYL 10 MG RE SUPP: 10.0000 mg | Freq: Every day | RECTAL | Status: DC | PRN

## 2012-08-04 MED ORDER — FLEET ENEMA 7-19 GM/118ML RE ENEM: 1.0000 | ENEMA | Freq: Once | RECTAL | Status: AC | PRN

## 2012-08-04 MED ORDER — ONDANSETRON HCL 4 MG/2ML IJ SOLN: 4.0000 mg | Freq: Once | INTRAMUSCULAR | Status: DC | PRN

## 2012-08-04 SURGICAL SUPPLY — 59 items
BIT DRILL 4.8MMDIAX5IN DISPOSE (BIT) ×1 IMPLANT
BIT DRILL CALIBRATED 4.3MMX365 (DRILL) IMPLANT
BIT DRILL CROWE PNT TWST 4.5MM (DRILL) IMPLANT
CLOTH BEACON ORANGE TIMEOUT ST (SAFETY) ×2 IMPLANT
CUFF TOURNIQUET SINGLE 34IN LL (TOURNIQUET CUFF) IMPLANT
CUFF TOURNIQUET SINGLE 44IN (TOURNIQUET CUFF) IMPLANT
DRAPE ORTHO SPLIT 77X108 STRL (DRAPES) ×4
DRAPE PROXIMA HALF (DRAPES) ×4 IMPLANT
DRAPE STERI IOBAN 125X83 (DRAPES) ×2 IMPLANT
DRAPE SURG 17X23 STRL (DRAPES) ×2 IMPLANT
DRAPE SURG ORHT 6 SPLT 77X108 (DRAPES) IMPLANT
DRAPE U-SHAPE 47X51 STRL (DRAPES) ×1 IMPLANT
DRILL BIT 4.8MMDIAX5IN DISPOSE (BIT) ×2
DRILL CALIBRATED 4.3MMX365 (DRILL) ×2
DRILL CROWE POINT TWIST 4.5MM (DRILL) ×2
DRSG ADAPTIC 3X8 NADH LF (GAUZE/BANDAGES/DRESSINGS) ×2 IMPLANT
DRSG PAD ABDOMINAL 8X10 ST (GAUZE/BANDAGES/DRESSINGS) ×2 IMPLANT
DURAPREP 26ML APPLICATOR (WOUND CARE) ×2 IMPLANT
ELECT CAUTERY BLADE 6.4 (BLADE) ×2 IMPLANT
ELECT REM PT RETURN 9FT ADLT (ELECTROSURGICAL) ×2
ELECTRODE REM PT RTRN 9FT ADLT (ELECTROSURGICAL) ×1 IMPLANT
EVACUATOR 1/8 PVC DRAIN (DRAIN) IMPLANT
GLOVE BIOGEL PI IND STRL 7.0 (GLOVE) IMPLANT
GLOVE BIOGEL PI IND STRL 7.5 (GLOVE) ×1 IMPLANT
GLOVE BIOGEL PI IND STRL 8 (GLOVE) ×1 IMPLANT
GLOVE BIOGEL PI INDICATOR 7.0 (GLOVE) ×1
GLOVE BIOGEL PI INDICATOR 7.5 (GLOVE) ×1
GLOVE BIOGEL PI INDICATOR 8 (GLOVE) ×1
GLOVE ECLIPSE 7.0 STRL STRAW (GLOVE) ×2 IMPLANT
GLOVE ORTHO TXT STRL SZ7.5 (GLOVE) ×2 IMPLANT
GLOVE SURG SS PI 6.5 STRL IVOR (GLOVE) ×1 IMPLANT
GOWN PREVENTION PLUS LG XLONG (DISPOSABLE) ×1 IMPLANT
GOWN STRL NON-REIN LRG LVL3 (GOWN DISPOSABLE) ×5 IMPLANT
GUIDEPIN 3.2X17.5 THRD DISP (PIN) ×1 IMPLANT
GUIDEWIRE BEAD TIP (WIRE) ×1 IMPLANT
KIT BASIN OR (CUSTOM PROCEDURE TRAY) ×2 IMPLANT
KIT ROOM TURNOVER OR (KITS) ×2 IMPLANT
MANIFOLD NEPTUNE II (INSTRUMENTS) ×1 IMPLANT
NAIL FEM RETRO 10.5X320 (Nail) ×1 IMPLANT
NS IRRIG 1000ML POUR BTL (IV SOLUTION) ×2 IMPLANT
PACK GENERAL/GYN (CUSTOM PROCEDURE TRAY) ×2 IMPLANT
PAD ARMBOARD 7.5X6 YLW CONV (MISCELLANEOUS) ×4 IMPLANT
SCREW CORT TI DBL LEAD 5X32 (Screw) ×1 IMPLANT
SCREW CORT TI DBL LEAD 5X60 (Screw) ×1 IMPLANT
SCREW CORT TI DBL LEAD 5X65 (Screw) ×1 IMPLANT
SCREW CORT TI DBL LEAD 5X80 (Screw) ×1 IMPLANT
SPONGE GAUZE 4X4 12PLY (GAUZE/BANDAGES/DRESSINGS) ×2 IMPLANT
STAPLER VISISTAT 35W (STAPLE) ×2 IMPLANT
SUT VIC AB 0 CT1 27 (SUTURE) ×6
SUT VIC AB 0 CT1 27XBRD ANBCTR (SUTURE) ×3 IMPLANT
SUT VIC AB 1 CT1 27 (SUTURE) ×2
SUT VIC AB 1 CT1 27XBRD ANBCTR (SUTURE) ×1 IMPLANT
SUT VIC AB 2-0 CT1 27 (SUTURE) ×2
SUT VIC AB 2-0 CT1 TAPERPNT 27 (SUTURE) ×1 IMPLANT
TAPE CLOTH SURG 4X10 WHT LF (GAUZE/BANDAGES/DRESSINGS) ×1 IMPLANT
TAPE CLOTH SURG 6X10 WHT LF (GAUZE/BANDAGES/DRESSINGS) ×1 IMPLANT
TOWEL OR 17X24 6PK STRL BLUE (TOWEL DISPOSABLE) ×2 IMPLANT
TOWEL OR 17X26 10 PK STRL BLUE (TOWEL DISPOSABLE) ×3 IMPLANT
WATER STERILE IRR 1000ML POUR (IV SOLUTION) ×2 IMPLANT

## 2012-08-04 NOTE — Progress Notes (Signed)
Orthopedic Tech Progress Note Patient Details:  [name] 24-May-1960 [id number] OHF applied to bed Patient ID: [name], male   DOB: 1960/06/01, 52 y.o.   MRN: [id number]   Orie Rout 08/04/2012, 1:03 PM

## 2012-08-04 NOTE — ED Provider Notes (Signed)
I have personally seen and examined the patient.  I have discussed the plan of care with the resident.  I have reviewed the documentation on PMH/FH/Soc. History.  I have reviewed the documentation of the resident and agree.    Joya Gaskins, MD 08/04/12 1018
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
## 2012-08-04 NOTE — Anesthesia Postprocedure Evaluation (Signed)
  Anesthesia Post-op Note  Patient: [name]  Procedure(s) Performed: Procedure(s) with comments: OPEN REDUCTION INTERNAL FIXATION (ORIF) DISTAL FEMUR FRACTURE (Right) - Right Retrograde Supracondylar Nail Right Femur  Patient Location: PACU  Anesthesia Type:General  Level of Consciousness: awake, alert , oriented and patient cooperative  Airway and Oxygen Therapy: Patient Spontanous Breathing  Post-op Pain: mild  Post-op Assessment: Post-op Vital signs reviewed, Patient's Cardiovascular Status Stable, Respiratory Function Stable, Patent Airway, No signs of Nausea or vomiting and Pain level controlled  Post-op Vital Signs: stable  Complications: No apparent anesthesia complications

## 2012-08-04 NOTE — Progress Notes (Signed)
UR COMPLETED  

## 2012-08-04 NOTE — Anesthesia Preprocedure Evaluation (Addendum)
Anesthesia Evaluation  Patient identified by MRN, date of birth, ID band Patient awake    Reviewed: Allergy & Precautions, H&P , NPO status , Patient's Chart, lab work & pertinent test results, reviewed documented beta blocker date and time   Airway Mallampati: I TM Distance: >3 FB     Dental  (+) Teeth Intact, Dental Advisory Given, Poor Dentition, Missing, Caps and Chipped   Pulmonary neg pulmonary ROS,  breath sounds clear to auscultation        Cardiovascular negative cardio ROS  Rhythm:Regular Rate:Normal     Neuro/Psych Depression negative neurological ROS     GI/Hepatic negative GI ROS, GERD-  Medicated and Controlled,(+) Hepatitis -, C  Endo/Other  negative endocrine ROS  Renal/GU   negative genitourinary   Musculoskeletal negative musculoskeletal ROS (+)   Abdominal (+)  Abdomen: soft. Bowel sounds: normal.  Peds negative pediatric ROS (+)  Hematology negative hematology ROS (+)   Anesthesia Other Findings Paraplegic  Reproductive/Obstetrics negative OB ROS                        Anesthesia Physical Anesthesia Plan  ASA: II  Anesthesia Plan: General   Post-op Pain Management:    Induction: Intravenous  Airway Management Planned: Oral ETT  Additional Equipment:   Intra-op Plan:   Post-operative Plan: Extubation in OR  Informed Consent: I have reviewed the patients History and Physical, chart, labs and discussed the procedure including the risks, benefits and alternatives for the proposed anesthesia with the patient or authorized representative who has indicated his/her understanding and acceptance.     Plan Discussed with: CRNA, Anesthesiologist and Surgeon  Anesthesia Plan Comments:         Anesthesia Quick Evaluation

## 2012-08-04 NOTE — Anesthesia Procedure Notes (Addendum)
Procedure Name: Intubation Date/Time: 08/04/2012 9:33 AM Performed by: Ellin Goodie Pre-anesthesia Checklist: Patient identified, Emergency Drugs available, Suction available, Patient being monitored and Timeout performed Patient Re-evaluated:Patient Re-evaluated prior to inductionOxygen Delivery Method: Circle system utilized Preoxygenation: Pre-oxygenation with 100% oxygen Intubation Type: IV induction Ventilation: Mask ventilation without difficulty Laryngoscope Size: Mac and 3 Grade View: Grade I Tube type: Oral Tube size: 7.5 mm Number of attempts: 1 Airway Equipment and Method: Stylet and LTA kit utilized Placement Confirmation: ETT inserted through vocal cords under direct vision,  positive ETCO2 and breath sounds checked- equal and bilateral Secured at: 23 cm Tube secured with: Tape Dental Injury: Teeth and Oropharynx as per pre-operative assessment  Comments: Easy atraumatic intubation with MAC 3 blade, x 1, LTA used.  Dr. Katrinka Blazing verified placement of ETT.  Carlynn Herald, CRNA

## 2012-08-04 NOTE — Preoperative (Signed)
Beta Blockers   Reason not to administer Beta Blockers:Not Applicable 

## 2012-08-04 NOTE — Interval H&P Note (Signed)
History and Physical Interval Note:  08/04/2012 9:21 AM  [name]  has presented today for surgery, with the diagnosis of Right Distal Femur Fracture  The various methods of treatment have been discussed with the patient and family. After consideration of risks, benefits and other options for treatment, the patient has consented to  Procedure(s) with comments: OPEN REDUCTION INTERNAL FIXATION (ORIF) DISTAL FEMUR FRACTURE (Right) - Right Retrograde Supracondylar Nail Right Femur as a surgical intervention .  The patient's history has been reviewed, patient examined, no change in status, stable for surgery.  I have reviewed the patient's chart and labs.  Questions were answered to the patient's satisfaction.     Karlita Lichtman C

## 2012-08-04 NOTE — Transfer of Care (Signed)
Immediate Anesthesia Transfer of Care Note  Patient: [name]  Procedure(s) Performed: Procedure(s) with comments: OPEN REDUCTION INTERNAL FIXATION (ORIF) DISTAL FEMUR FRACTURE (Right) - Right Retrograde Supracondylar Nail Right Femur  Patient Location: PACU  Anesthesia Type:General  Level of Consciousness: awake, alert  and oriented  Airway & Oxygen Therapy: Patient connected to face mask  Post-op Assessment: Report given to PACU RN  Post vital signs: stable  Complications: No apparent anesthesia complications

## 2012-08-05 LAB — COMPREHENSIVE METABOLIC PANEL
ALT: 15 U/L (ref 0–53)
Albumin: 2.3 g/dL — ABNORMAL LOW (ref 3.5–5.2)
Alkaline Phosphatase: 63 U/L (ref 39–117)
Calcium: 7.8 mg/dL — ABNORMAL LOW (ref 8.4–10.5)
Potassium: 4 mEq/L (ref 3.5–5.1)
Sodium: 136 mEq/L (ref 135–145)
Total Protein: 5.8 g/dL — ABNORMAL LOW (ref 6.0–8.3)

## 2012-08-05 NOTE — Evaluation (Signed)
Physical Therapy Evaluation Patient Details Name: Stevin Bielinski MRN: [id number] DOB: [birth year] Today's Date: 08/05/2012 Time: 4098-1191 PT Time Calculation (min): 17 min  PT Assessment / Plan / Recommendation Clinical Impression  Patient with h/o paraplegia, fell out of w/c and now s/p IM nail right femur.  Patient is independent with bed mobility and transfers.  No further PT needs identified.  Will sign off.    PT Assessment  Patent does not need any further PT services    Follow Up Recommendations  No PT follow up    Does the patient have the potential to tolerate intense rehabilitation      Barriers to Discharge        Equipment Recommendations  None recommended by PT    Recommendations for Other Services     Frequency      Precautions / Restrictions Precautions Precautions: Other (comment) Precaution Comments: paraplegia   Pertinent Vitals/Pain No pain indicated      Mobility  Bed Mobility Bed Mobility: Supine to Sit;Sit to Supine Supine to Sit: 7: Independent Sit to Supine: 7: Independent Transfers Transfers: Lateral/Scoot Transfers Lateral/Scoot Transfers: 7: Independent    Exercises     PT Diagnosis:    PT Problem List:   PT Treatment Interventions:     PT Goals    Visit Information  Last PT Received On: 08/05/12 Assistance Needed: +1    Subjective Data  Subjective: My power wheelchair is still at [facility], how am I going to get it here? Patient Stated Goal: Go home tomorrow!   Prior Functioning  Home Living Lives With: Alone Available Help at Discharge: Friend(s);Available PRN/intermittently Type of Home: House Home Access: Ramped entrance Home Layout: One level Home Adaptive Equipment: Wheelchair - powered Prior Function Level of Independence: Independent with assistive device(s) Communication Communication: No difficulties    Cognition  Cognition Arousal/Alertness: Awake/alert Behavior During Therapy: WFL for tasks  assessed/performed Overall Cognitive Status: Within Functional Limits for tasks assessed    Extremity/Trunk Assessment Right Upper Extremity Assessment RUE ROM/Strength/Tone: WFL for tasks assessed Left Upper Extremity Assessment LUE ROM/Strength/Tone: WFL for tasks assessed Right Lower Extremity Assessment RLE ROM/Strength/Tone: Deficits RLE ROM/Strength/Tone Deficits: paraplegia BLE Left Lower Extremity Assessment LLE ROM/Strength/Tone: Deficits LLE ROM/Strength/Tone Deficits: paraplegia BLE Trunk Assessment Trunk Assessment: Normal   Balance    End of Session PT - End of Session Activity Tolerance: Patient tolerated treatment well Patient left: in bed;with call bell/phone within reach Nurse Communication: Mobility status  GP     Olivia Canter,  [phone] 08/05/2012, 2:31 PM

## 2012-08-05 NOTE — Progress Notes (Signed)
Subjective: Pt stable - pain mod well controlled   Objective: Vital signs in last 24 hours: Temp:  [97.1 F (36.2 C)-98.7 F (37.1 C)] 97.1 F (36.2 C) (05/31 0648) Pulse Rate:  [70-96] 82 (05/31 0648) Resp:  [13-20] 18 (05/31 0648) BP: (107-136)/(59-96) 125/88 mmHg (05/31 0648) SpO2:  [98 %-100 %] 99 % (05/31 0648)  Intake/Output from previous day: 05/30 0701 - 05/31 0700 In: 500 [I.V.:500] Out: 250 [Urine:200; Blood:50] Intake/Output this shift:    Exam:  dressing dry - leg has swellingon right - pre op nerve damage present  Labs:  Recent Labs  08/03/12 0922 08/03/12 2301  HGB 13.2 10.5*    Recent Labs  08/03/12 0922 08/03/12 2301  WBC 11.0* 7.8  RBC 4.69 3.79*  HCT 38.9* 31.3*  PLT 224 196    Recent Labs  08/04/12 0530 08/05/12 0430  NA 132* 136  K 3.7 4.0  CL 98 103  CO2 27 28  BUN 14 8  CREATININE 0.72 0.67  GLUCOSE 117* 126*  CALCIUM 7.7* 7.8*    Recent Labs  08/03/12 0922  INR 1.08    Assessment/Plan: Mobilize with PT for transfers - possible dc mon   Lucila Klecka SCOTT 08/05/2012, 7:51 AM

## 2012-08-06 LAB — COMPREHENSIVE METABOLIC PANEL
ALT: 12 U/L (ref 0–53)
CO2: 30 mEq/L (ref 19–32)
Calcium: 8.6 mg/dL (ref 8.4–10.5)
Chloride: 101 mEq/L (ref 96–112)
Creatinine, Ser: 0.66 mg/dL (ref 0.50–1.35)
GFR calc Af Amer: >90 mL/min (ref >90)
GFR calc non Af Amer: >90 mL/min (ref >90)
Glucose, Bld: 105 mg/dL — ABNORMAL HIGH (ref 70–99)
Total Bilirubin: 0.4 mg/dL (ref 0.3–1.2)

## 2012-08-06 MED ORDER — HYDROCODONE-ACETAMINOPHEN 5-325 MG PO TABS: 1.0000 | ORAL_TABLET | Freq: Four times a day (QID) | ORAL | Status: DC | PRN

## 2012-08-06 NOTE — Progress Notes (Signed)
NCM spoke to pt and states he is active with [facility]. Faxed referral to [facility] for resumption of care. Isidoro Donning RN CCM Case Mgmt phone [phone]

## 2012-08-06 NOTE — Progress Notes (Signed)
Subjective: Pt stable - requests dc to home   Objective: Vital signs in last 24 hours: Temp:  [98 F (36.7 C)-98.6 F (37 C)] 98.3 F (36.8 C) (06/01 0525) Pulse Rate:  [79-90] 79 (06/01 0525) Resp:  [16-18] 18 (06/01 0525) BP: (108-122)/(67-84) 110/77 mmHg (06/01 0525) SpO2:  [97 %-100 %] 100 % (06/01 0525)  Intake/Output from previous day: 05/31 0701 - 06/01 0700 In: 2981.3 [P.O.:1880; I.V.:1101.3] Out: 2300 [Urine:2300] Intake/Output this shift:    Exam:  Intact pulses distally No cellulitis present  Labs:  Recent Labs  08/03/12 2301  HGB 10.5*    Recent Labs  08/03/12 2301  WBC 7.8  RBC 3.79*  HCT 31.3*  PLT 196    Recent Labs  08/05/12 0430 08/06/12 0425  NA 136 136  K 4.0 3.6  CL 103 101  CO2 28 30  BUN 8 5*  CREATININE 0.67 0.66  GLUCOSE 126* 105*  CALCIUM 7.8* 8.6   No results found for this basename: LABPT, INR,  in the last 72 hours  Assessment/Plan: Plan for dc today with [facility]   DEAN,GREGORY SCOTT 08/06/2012, 12:00 PM

## 2012-08-06 NOTE — Progress Notes (Deleted)
Subjective: Pt stable - has been up   Objective: Vital signs in last 24 hours: Temp:  [98 F (36.7 C)-98.6 F (37 C)] 98.3 F (36.8 C) (06/01 0525) Pulse Rate:  [79-90] 79 (06/01 0525) Resp:  [16-18] 18 (06/01 0525) BP: (108-122)/(67-84) 110/77 mmHg (06/01 0525) SpO2:  [97 %-100 %] 100 % (06/01 0525)  Intake/Output from previous day: 05/31 0701 - 06/01 0700 In: 2981.3 [P.O.:1880; I.V.:1101.3] Out: 2300 [Urine:2300] Intake/Output this shift:    Exam:  Neurovascular intact Sensation intact distally Intact pulses distally  Labs:  Recent Labs  08/03/12 2301  HGB 10.5*    Recent Labs  08/03/12 2301  WBC 7.8  RBC 3.79*  HCT 31.3*  PLT 196    Recent Labs  08/05/12 0430 08/06/12 0425  NA 136 136  K 4.0 3.6  CL 103 101  CO2 28 30  BUN 8 5*  CREATININE 0.67 0.66  GLUCOSE 126* 105*  CALCIUM 7.8* 8.6   No results found for this basename: LABPT, INR,  in the last 72 hours  Assessment/Plan: Plan snf this week - making progress   [name] 08/06/2012, 12:23 PM

## 2012-08-08 ENCOUNTER — Encounter (HOSPITAL_COMMUNITY): Payer: Self-pay | Admitting: Orthopaedic Surgery

## 2012-09-25 DIAGNOSIS — S72451A Displaced supracondylar fracture without intracondylar extension of lower end of right femur, initial encounter for closed fracture: Secondary | ICD-10-CM

## 2012-09-25 NOTE — Discharge Summary (Signed)
Physician Discharge Summary  Patient ID: [name] MRN: [id number] DOB/AGE: 1960/08/30 52 y.o.  Admit date: 08/03/2012 Discharge date: 09/25/2012  Admission Diagnoses:  Closed supracondylar fracture of right femur  Discharge Diagnoses:  Principal Problem:   Closed supracondylar fracture of right femur   Past Medical History  Diagnosis Date  . Paraparesis     parapalegic   . Depression   . Anxiety   . Hepatitis C   . MRSA infection   . Anemia     Patient states that he was recently told this  . Paralysis     Surgeries: Procedure(s): OPEN REDUCTION INTERNAL FIXATION (ORIF) DISTAL FEMUR FRACTURE on 08/03/2012 - 08/04/2012   Consultants (if any):  none  Discharged Condition: Improved  Hospital Course: [name] is an 52 y.o. male who was admitted 08/03/2012 with a diagnosis of Closed supracondylar fracture of right femur and went to the operating room on 08/03/2012 - 08/04/2012 and underwent the above named procedures.    He was given perioperative antibiotics:      Anti-infectives   Start     Dose/Rate Route Frequency Ordered Stop   08/04/12 0600  ceFAZolin (ANCEF) IVPB 2 g/50 mL premix     2 g 100 mL/hr over 30 Minutes Intravenous On call to O.R. 08/04/12 0510 08/04/12 0933    .  He was given sequential compression devices, early ambulation, and aspirin for DVT prophylaxis.  He benefited maximally from the hospital stay and there were no complications.    Recent vital signs:  Filed Vitals:   08/06/12 0525  BP: 110/77  Pulse: 79  Temp: 98.3 F (36.8 C)  Resp: 18    Recent laboratory studies:  Lab Results  Component Value Date   HGB 10.5* 08/03/2012   HGB 13.2 08/03/2012   HGB 13.7 01/31/2012   Lab Results  Component Value Date   WBC 7.8 08/03/2012   PLT 196 08/03/2012   Lab Results  Component Value Date   INR 1.08 08/03/2012   Lab Results  Component Value Date   NA 136 08/06/2012   K 3.6 08/06/2012   CL 101 08/06/2012   CO2 30 08/06/2012   BUN 5*  08/06/2012   CREATININE 0.66 08/06/2012   GLUCOSE 105* 08/06/2012    Discharge Medications:     Medication List         HYDROcodone-acetaminophen 5-325 MG per tablet  Commonly known as:  NORCO/VICODIN  Take 1-2 tablets by mouth every 6 (six) hours as needed.     methadone 10 MG tablet  Commonly known as:  DOLOPHINE  Take 20 mg by mouth 3 (three) times daily. For pain     pregabalin 75 MG capsule  Commonly known as:  LYRICA  Take 75 mg by mouth 3 (three) times daily.        Diagnostic Studies: No results found.  Disposition: 06-Home-Health Care Svc  Discharge Orders   Future Orders Complete By Expires     Apply dressing  As directed     Call MD / Call 911  As directed     Comments:      If you experience chest pain or shortness of breath, CALL 911 and be transported to the hospital emergency room.  If you develope a fever above 101 F, pus (white drainage) or increased drainage or redness at the wound, or calf pain, call your surgeon's office.    Constipation Prevention  As directed     Comments:  Drink plenty of fluids.  Prune juice may be helpful.  You may use a stool softener, such as Colace (over the counter) 100 mg twice a day.  Use MiraLax (over the counter) for constipation as needed.    Diet - low sodium heart healthy  As directed     Discharge instructions  As directed     Comments:      Keep incision dry    Increase activity slowly as tolerated  As directed     Non weight bearing  As directed        Follow-up Information   Follow up with [facility]. (Home Health RN and Physical Therapy)    Contact information:   [phone]      Follow up with Eldred Manges, MD. Schedule an appointment as soon as possible for a visit in 2 weeks.   Contact information:   [address] [place] [address] [phone]        Signed: Wende Neighbors 09/25/2012, 3:44 PM

## 2012-11-29 ENCOUNTER — Telehealth (INDEPENDENT_AMBULATORY_CARE_PROVIDER_SITE_OTHER): Payer: Self-pay | Admitting: Internal Medicine

## 2012-11-29 ENCOUNTER — Other Ambulatory Visit (INDEPENDENT_AMBULATORY_CARE_PROVIDER_SITE_OTHER): Payer: Self-pay | Admitting: Internal Medicine

## 2012-11-29 DIAGNOSIS — B182 Chronic viral hepatitis C: Secondary | ICD-10-CM

## 2012-11-29 LAB — PROTIME-INR: INR: 1.07 (ref <1.50)

## 2012-11-29 NOTE — Telephone Encounter (Signed)
Patient presented to office today. Requesting lab work to be done before his next office visit which is next week.  Will get a CBC, CMET, PT/INR, Hep C quaint, Hep C genotype.

## 2012-11-30 LAB — HEPATITIS C RNA QUANTITATIVE
HCV Quantitative Log: 6.24 {Log} — ABNORMAL HIGH (ref <1.18)
HCV Quantitative: 1734248 IU/mL — ABNORMAL HIGH (ref <15)

## 2012-12-05 ENCOUNTER — Encounter (INDEPENDENT_AMBULATORY_CARE_PROVIDER_SITE_OTHER): Payer: Self-pay | Admitting: Internal Medicine

## 2012-12-05 ENCOUNTER — Ambulatory Visit (INDEPENDENT_AMBULATORY_CARE_PROVIDER_SITE_OTHER): Payer: Medicare Other | Admitting: Internal Medicine

## 2012-12-05 VITALS — BP 104/72 | HR 82 | Temp 98.2°F | Ht 69.0 in | Wt 130.0 lb

## 2012-12-05 DIAGNOSIS — B1920 Unspecified viral hepatitis C without hepatic coma: Secondary | ICD-10-CM

## 2012-12-05 NOTE — Patient Instructions (Addendum)
[facility] in December with Dr. Karilyn Cota
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
## 2012-12-11 ENCOUNTER — Ambulatory Visit (HOSPITAL_COMMUNITY): Payer: Medicare Other | Attending: Internal Medicine

## 2012-12-12 ENCOUNTER — Encounter (INDEPENDENT_AMBULATORY_CARE_PROVIDER_SITE_OTHER): Payer: Self-pay

## 2013-03-05 ENCOUNTER — Ambulatory Visit (INDEPENDENT_AMBULATORY_CARE_PROVIDER_SITE_OTHER): Payer: Medicare Other | Admitting: Internal Medicine

## 2013-03-05 ENCOUNTER — Encounter (INDEPENDENT_AMBULATORY_CARE_PROVIDER_SITE_OTHER): Payer: Self-pay | Admitting: Internal Medicine

## 2013-03-05 VITALS — BP 138/80 | HR 76 | Temp 97.1°F | Resp 18 | Ht 69.0 in | Wt 130.0 lb

## 2013-03-05 DIAGNOSIS — B182 Chronic viral hepatitis C: Secondary | ICD-10-CM

## 2013-03-05 NOTE — Progress Notes (Signed)
Presenting complaint;  Followup for chronic hepatitis C.  Database;  Patient is 52 year old Caucasian male who has chronic hepatitis C genotype 1B. This was initially diagnosed in 2001. He had liver fibrosis panel done in March 2013 and  Hepascore was 0.24 indicating a minimal or no fibrosis. He had ultrasound in March 2013 which revealed no abnormality. Patient was anxious to be treated. Patient is paraplegic and therefore he was advised against therapy with interferon-based regimens. He is aware of the treatment options and keen to be treated.  Subjective;  He has very good appetite. He denies heartburn nausea vomiting abdominal pain melena or rectal bleeding. Every now and then he becomes constipated. He has neurogenic bladder and has to catheter himself 4-5 times each day. He does not drink alcohol. He lives alone. He is able to move around freely and is motorized wheelchair. He states his sacral decubitus now healed. He is not having any drainage at this time. He is not being followed at [facility] anymore. He is on methadone and Lyrica for chronic back and leg pain.     Current Medications: Current Outpatient Prescriptions  Medication Sig Dispense Refill  . methadone (DOLOPHINE) 10 MG tablet Take 20 mg by mouth 3 (three) times daily. For pain      . pregabalin (LYRICA) 75 MG capsule Take 75 mg by mouth 3 (three) times daily.        No current facility-administered medications for this visit.     Objective: Blood pressure 138/80, pulse 76, temperature 97.1 F (36.2 C), temperature source Oral, resp. rate 18, height 5\' 9"  (1.753 m), weight 130 lb (58.968 kg). Patient is alert and in no acute distress. He is in his wheelchair. Conjunctiva is pink. Sclera is nonicteric Oropharyngeal mucosa is normal. No neck masses or thyromegaly noted. He has small tattoo on right side of his neck. Cardiac exam with regular rhythm normal S1 and S2. No murmur or gallop noted. Lungs are clear to  auscultation. Abdomen is symmetrical. He has long right subcostal scar and a large tattoo. Abdomen is soft without hepatosplenomegaly. No LE edema or clubbing noted.  Labs/studies Results: Lab data from 11/29/2012. WBC 8.4, H&H 13.4 and 39.1 and platelet count 288K. Serum sodium 136, potassium 4.1, chloride 101, CO2 27, BUN 10, creatinine 0.63 and glucose 84. Bilirubin 0.7, AP 112, AST 27, ALT 29 total protein 7.8 albumin 3.9. Calcium 9.2. TSH 1.491. HCVRNA [id number] IU/mL or 6.24 log. Genotype 1b.   Assessment:  #1. Chronic hepatitis C genotype 1b. Patient has no stigmata of chronic liver disease or portal hypertension. Liver fibrosis score was low last year. I do not see contraindication to treatment with Harvoni if approved. He will need 12 weeks of therapy. #2. Patient has chronic sacral decubitis ulcer and needs to followup with Dr. Tanda Rockers.  #3.He is average risk for CRC. Will consider screening colonoscopy once hep C therapy has been completed.  Plan:  Will send request for Harvoni to his carrier next week. He will need HCVRNA after 4 weeks of therapy. Office visit in 5 weeks after therapy initiated.

## 2013-03-05 NOTE — Patient Instructions (Signed)
Will send request for Harvoni to your insurance next week. Please followup with Dr. Tanda Rockers regarding sacral decubitus ulcer

## 2013-03-06 ENCOUNTER — Ambulatory Visit (INDEPENDENT_AMBULATORY_CARE_PROVIDER_SITE_OTHER): Payer: Medicare Other | Admitting: Internal Medicine

## 2013-04-11 ENCOUNTER — Telehealth (INDEPENDENT_AMBULATORY_CARE_PROVIDER_SITE_OTHER): Payer: Self-pay | Admitting: *Deleted

## 2013-04-11 DIAGNOSIS — B1920 Unspecified viral hepatitis C without hepatic coma: Secondary | ICD-10-CM

## 2013-04-11 NOTE — Telephone Encounter (Signed)
[name] has received his Hep C medication. His return phone number is [phone].

## 2013-04-11 NOTE — Telephone Encounter (Signed)
I have talked with [name]. He will take his first Harvoni tomorrow,04/12/13. Patient will have lab work in 4 weeks,05/10/13. CBC , Hepatic Profile, Hepatitis C RNA Quant

## 2013-04-11 NOTE — Telephone Encounter (Signed)
Patient called twice and the message from phone service was that the call could not go through try again. Will attempt to call again .

## 2013-05-02 ENCOUNTER — Encounter (INDEPENDENT_AMBULATORY_CARE_PROVIDER_SITE_OTHER): Payer: Self-pay | Admitting: *Deleted

## 2013-05-02 ENCOUNTER — Other Ambulatory Visit (INDEPENDENT_AMBULATORY_CARE_PROVIDER_SITE_OTHER): Payer: Self-pay | Admitting: *Deleted

## 2013-05-02 DIAGNOSIS — B1920 Unspecified viral hepatitis C without hepatic coma: Secondary | ICD-10-CM

## 2013-05-07 ENCOUNTER — Telehealth (INDEPENDENT_AMBULATORY_CARE_PROVIDER_SITE_OTHER): Payer: Self-pay | Admitting: *Deleted

## 2013-05-07 NOTE — Telephone Encounter (Signed)
Patient started Hep C treatment on 04/12/13. Per Dr.Rehman's OV note , patient will need to have OV after 5 th week of treatment. Patient has been sent a letter that his lab work is due on 05/10/13.

## 2013-05-09 NOTE — Telephone Encounter (Signed)
Apt has been scheduled for 05/17/13 with Terri Setzer, NP. 

## 2013-05-11 LAB — HEPATIC FUNCTION PANEL
ALBUMIN: 4.3 g/dL (ref 3.5–5.2)
ALT: 13 U/L (ref 0–53)
AST: 20 U/L (ref 0–37)
Alkaline Phosphatase: 90 U/L (ref 39–117)
BILIRUBIN TOTAL: 0.5 mg/dL (ref 0.2–1.2)
Bilirubin, Direct: 0.1 mg/dL (ref 0.0–0.3)
Indirect Bilirubin: 0.4 mg/dL (ref 0.2–1.2)
Total Protein: 8.3 g/dL (ref 6.0–8.3)

## 2013-05-11 LAB — CBC
HCT: 40.4 % (ref 39.0–52.0)
Hemoglobin: 13.4 g/dL (ref 13.0–17.0)
MCH: 27.2 pg (ref 26.0–34.0)
MCHC: 33.2 g/dL (ref 30.0–36.0)
MCV: 81.9 fL (ref 78.0–100.0)
PLATELETS: 262 10*3/uL (ref 150–400)
RBC: 4.93 MIL/uL (ref 4.22–5.81)
RDW: 15.4 % (ref 11.5–15.5)
WBC: 5.9 10*3/uL (ref 4.0–10.5)

## 2013-05-11 LAB — HEPATITIS C RNA QUANTITATIVE: HCV QUANT: NOT DETECTED [IU]/mL (ref <15)

## 2013-05-17 ENCOUNTER — Other Ambulatory Visit (INDEPENDENT_AMBULATORY_CARE_PROVIDER_SITE_OTHER): Payer: Self-pay | Admitting: Internal Medicine

## 2013-05-17 ENCOUNTER — Encounter (INDEPENDENT_AMBULATORY_CARE_PROVIDER_SITE_OTHER): Payer: Self-pay | Admitting: Internal Medicine

## 2013-05-17 ENCOUNTER — Ambulatory Visit (INDEPENDENT_AMBULATORY_CARE_PROVIDER_SITE_OTHER): Payer: PRIVATE HEALTH INSURANCE | Admitting: Internal Medicine

## 2013-05-17 VITALS — BP 100/84 | HR 72 | Temp 98.1°F | Ht 69.0 in

## 2013-05-17 DIAGNOSIS — B1920 Unspecified viral hepatitis C without hepatic coma: Secondary | ICD-10-CM

## 2013-05-17 NOTE — Progress Notes (Signed)
Subjective:     Patient ID: [name], male   DOB: 09/06/1960, 53 y.o.   MRN: [id number]  HPI Here today for f/u of his Hepatitis C. Recent labs; he has cleared the virus. Genotype 1B. Diagnosed in 2011.  Thinks he contracted Hep C from tattoos.  He had liver fibrosis panel done in March of 2013 and Hepascore was 0.24 indicating a minimal or no fibrosis. US in March which revealed no abnormality. He is a paraplegic.  Started treatment 04/12/2013 with Harvoni. This is week 5 for him. He is wheel chair bound. He missed a couple of doses. Appetite has been good. No weight loss. He is having some constipation  No rash. No joint pain.  He usually has a BM every 3-4 days. No melena or bright red rectal bleeding.   He says he does have a decubitus to his buttock which he has had for six years.   05/10/2013 Hepatitis C quaint: undetectable Hepatic Function Panel     Component Value Date/Time   PROT 8.3 05/10/2013 1434   ALBUMIN 4.3 05/10/2013 1434   AST 20 05/10/2013 1434   ALT 13 05/10/2013 1434   ALKPHOS 90 05/10/2013 1434   BILITOT 0.5 05/10/2013 1434   BILIDIR 0.1 05/10/2013 1434   IBILI 0.4 05/10/2013 1434   CBC    Component Value Date/Time   WBC 5.9 05/10/2013 1431   RBC 4.93 05/10/2013 1431   RBC 3.60* 01/31/2009 0715   HGB 13.4 05/10/2013 1431   HCT 40.4 05/10/2013 1431   PLT 262 05/10/2013 1431   MCV 81.9 05/10/2013 1431   MCH 27.2 05/10/2013 1431   MCHC 33.2 05/10/2013 1431   RDW 15.4 05/10/2013 1431   LYMPHSABS 1.4 01/31/2012 0931   MONOABS 0.7 01/31/2012 0931   EOSABS 0.1 01/31/2012 0931   BASOSABS 0.0 01/31/2012 0931      Review of Systems Past Medical History  Diagnosis Date  . Paraparesis     parapalegic   . Depression   . Anxiety   . Hepatitis C   . MRSA infection   . Anemia     Patient states that he was recently told this  . Paralysis     Past Surgical History  Procedure Laterality Date  . Back surgery    . Gallbladder surgery  2010  . Femur fracture surgery  March 2012   retrograde intramedullary femoral nail  . Hernia repair    . Cholecystectomy    . Orif femur fracture Right 08/04/2012    Procedure: OPEN REDUCTION INTERNAL FIXATION (ORIF) DISTAL FEMUR FRACTURE;  Surgeon: Eldred MangesMark C Yates, MD;  Location: [facility] OR;  Service: Orthopedics;  Laterality: Right;  Right Retrograde Supracondylar Nail Right Femur    No Known Allergies  Current Outpatient Prescriptions on File Prior to Visit  Medication Sig Dispense Refill  . methadone (DOLOPHINE) 10 MG tablet Take 20 mg by mouth 3 (three) times daily. For pain      . pregabalin (LYRICA) 75 MG capsule Take 75 mg by mouth 3 (three) times daily.        No current facility-administered medications on file prior to visit.       Weight was not obtained. Patient is unable to stand. He is a paraplegic.  Objective:   Physical Exam  Filed Vitals:   05/17/13 1531  BP: 100/84  Pulse: 72  Temp: 98.1 F (36.7 C)  Height: 5\' 9"  (1.753 m)   Alert and oriented. Skin warm and dry. Oral mucosa  is moist.   . Sclera anicteric, conjunctivae is pink. Thyroid not enlarged. No cervical lymphadenopathy. Lungs clear. Heart regular rate and rhythm.  Abdomen is soft. Bowel sounds are positive. No hepatomegaly. No abdominal masses felt. No tenderness.        Assessment:     Hepatitis C. He has cleared the virus. Doing well.     Plan:   Liver profile, CBC, and Hep C quaint in 4 weeks.  OV in 4 weeks.

## 2013-05-17 NOTE — Patient Instructions (Signed)
Hep C quaint and liver profile in 4 weeks.

## 2013-05-18 ENCOUNTER — Telehealth (INDEPENDENT_AMBULATORY_CARE_PROVIDER_SITE_OTHER): Payer: Self-pay | Admitting: *Deleted

## 2013-05-18 DIAGNOSIS — B1920 Unspecified viral hepatitis C without hepatic coma: Secondary | ICD-10-CM

## 2013-05-18 NOTE — Telephone Encounter (Signed)
Per Dorene Arerri Setzer ,NP  the patient will need to have labs drawn in 4 weeks .

## 2013-05-29 ENCOUNTER — Telehealth (INDEPENDENT_AMBULATORY_CARE_PROVIDER_SITE_OTHER): Payer: Self-pay | Admitting: *Deleted

## 2013-05-29 NOTE — Telephone Encounter (Signed)
We will wait till we hear from his Pharmacy. There has been nothing rec'd to date,05/29/13.

## 2013-05-29 NOTE — Telephone Encounter (Signed)
[name] has 10 pills left of the Harvoni. Just wanted to remind [name] to get the [name] Authorization for his next months worth. His return phone number is [phone].

## 2013-05-29 NOTE — Telephone Encounter (Signed)
It is the patient's responsibility to call pharmacy and let them know he needs a refill. If, at that point, he needs something done by us they will make us aware.

## 2013-05-29 NOTE — Telephone Encounter (Signed)
Francee PiccoloDerek said he did call the refill in and he was told a PA was needed. Elroy ChannelAdvised Sandra to call his pharmacy and ask them to send the PA request to Dr. Patty Sermonsehman's office.

## 2013-05-30 ENCOUNTER — Encounter (INDEPENDENT_AMBULATORY_CARE_PROVIDER_SITE_OTHER): Payer: Self-pay | Admitting: *Deleted

## 2013-05-30 ENCOUNTER — Other Ambulatory Visit (INDEPENDENT_AMBULATORY_CARE_PROVIDER_SITE_OTHER): Payer: Self-pay | Admitting: *Deleted

## 2013-05-30 DIAGNOSIS — B1920 Unspecified viral hepatitis C without hepatic coma: Secondary | ICD-10-CM

## 2013-06-01 ENCOUNTER — Telehealth (INDEPENDENT_AMBULATORY_CARE_PROVIDER_SITE_OTHER): Payer: Self-pay | Admitting: *Deleted

## 2013-06-01 DIAGNOSIS — B1920 Unspecified viral hepatitis C without hepatic coma: Secondary | ICD-10-CM

## 2013-06-01 NOTE — Telephone Encounter (Signed)
Patient advised of Tammy's message dated 06/01/13. Voices understood.

## 2013-06-01 NOTE — Telephone Encounter (Signed)
.  Per Delrae Renderri Setzer,NP patient to have labs in June, 3 months post Hep C treatment.

## 2013-06-05 ENCOUNTER — Telehealth (INDEPENDENT_AMBULATORY_CARE_PROVIDER_SITE_OTHER): Payer: Self-pay | Admitting: *Deleted

## 2013-06-05 NOTE — Telephone Encounter (Signed)
A lady from [employer], Armando ReichertMarian at  [phone], called to confirm another shipment for his medicine. He advised her of what our office said. She told him, someone from [employer] office will need to call her to confirm this. Graiden's return phone number is [phone].

## 2013-06-06 NOTE — Telephone Encounter (Signed)
I have spoke with Keith PartridgeMiriam. Explained that the patient has completed 8 weeks of therapy, and that Insurance will not cover any additional refills. Patient was advised of this last week and that he would be having lab work in 3 months. She is going to call the patient and make him aware.

## 2013-06-14 ENCOUNTER — Ambulatory Visit (INDEPENDENT_AMBULATORY_CARE_PROVIDER_SITE_OTHER): Payer: Medicare Other | Admitting: Internal Medicine

## 2013-06-21 ENCOUNTER — Telehealth (INDEPENDENT_AMBULATORY_CARE_PROVIDER_SITE_OTHER): Payer: Self-pay | Admitting: *Deleted

## 2013-06-21 ENCOUNTER — Encounter (INDEPENDENT_AMBULATORY_CARE_PROVIDER_SITE_OTHER): Payer: Self-pay | Admitting: Internal Medicine

## 2013-06-21 ENCOUNTER — Ambulatory Visit (INDEPENDENT_AMBULATORY_CARE_PROVIDER_SITE_OTHER): Payer: PRIVATE HEALTH INSURANCE | Admitting: Internal Medicine

## 2013-06-21 VITALS — BP 118/78 | HR 80 | Temp 97.5°F | Ht 69.0 in

## 2013-06-21 DIAGNOSIS — B1920 Unspecified viral hepatitis C without hepatic coma: Secondary | ICD-10-CM

## 2013-06-21 NOTE — Telephone Encounter (Signed)
.  Per Terri Setzer,NP 

## 2013-06-21 NOTE — Patient Instructions (Signed)
OV in 6 months. 

## 2013-06-21 NOTE — Progress Notes (Addendum)
Subjective:     Patient ID: [name], male   DOB: Jul 18, 1960, 53 y.o.   MRN: [id number]  HPIHere today for f/u of his Hepatitis C. From his last visit, he has cleared the virus. He feels good. He says he is lonely. He feels depressed. No really side effects except a small headache.  His appetite has been good. No weight loss.  Genotype 1B. Diagnosed in 2011. Thinks he contracted Hep C from tattoos. He had liver fibrosis panel done in March of 2013 and Hepascore was 0.24 indicating a minimal or no fibrosis. US in March which revealed no abnormality. He is a paraplegic.  Started treatment 04/12/2013 with Harvoni. He finished 06/08/2013 He is wheel chair bound. He missed a couple of doses.  Appetite has been good. No weight loss per patient. He is having some constipation  No rash. No joint pain.  He usually has a BM every 3-4 days. No melena or bright red rectal bleeding.  He says he does have a decubitus to his buttock which he has had for six years.   Hepatic Function Panel     Component Value Date/Time   PROT 8.3 05/10/2013 1434   ALBUMIN 4.3 05/10/2013 1434   AST 20 05/10/2013 1434   ALT 13 05/10/2013 1434   ALKPHOS 90 05/10/2013 1434   BILITOT 0.5 05/10/2013 1434   BILIDIR 0.1 05/10/2013 1434   IBILI 0.4 05/10/2013 1434      Review of Systems Past Medical History  Diagnosis Date  . Paraparesis     parapalegic   . Depression   . Anxiety   . Hepatitis C   . MRSA infection   . Anemia     Patient states that he was recently told this  . Paralysis     Past Surgical History  Procedure Laterality Date  . Back surgery    . Gallbladder surgery  2010  . Femur fracture surgery  March 2012    retrograde intramedullary femoral nail  . Hernia repair    . Cholecystectomy    . Orif femur fracture Right 08/04/2012    Procedure: OPEN REDUCTION INTERNAL FIXATION (ORIF) DISTAL FEMUR FRACTURE;  Surgeon: Eldred MangesMark C Yates, MD;  Location: [facility] OR;  Service: Orthopedics;  Laterality: Right;  Right Retrograde  Supracondylar Nail Right Femur    No Known Allergies  Current Outpatient Prescriptions on File Prior to Visit  Medication Sig Dispense Refill  . methadone (DOLOPHINE) 10 MG tablet Take 20 mg by mouth 3 (three) times daily. For pain      . pregabalin (LYRICA) 75 MG capsule Take 75 mg by mouth 3 (three) times daily.       . Ledipasvir-Sofosbuvir (HARVONI PO) Take by mouth.       No current facility-administered medications on file prior to visit.        Objective:   Physical Exam  Filed Vitals:   06/21/13 1518  BP: 118/78  Pulse: 80  Temp: 97.5 F (36.4 C)  Height: 5\' 9"  (1.753 m)  Weight was not obtained. He is wheelchair bound. He is a paraplegic.  Alert and oriented. Skin warm and dry. Oral mucosa is moist.   . Sclera anicteric, conjunctivae is pink. Thyroid not enlarged. No cervical lymphadenopathy. Lungs clear. Heart regular rate and rhythm.  Abdomen is soft. Bowel sounds are positive.  . No abdominal masses felt. No tenderness.  No edema to lower extremities.   Patient examined from wheelchair. He is wheelchair bound. He is a  paraplegic.      Assessment:     Hepatitis C. He has cleared the virus. No problems except depression.     Plan:    OV 6 months with a Hepatic Profile and Hep C quaint.  Hep C quaint and an hepatic function today

## 2013-06-22 ENCOUNTER — Telehealth (INDEPENDENT_AMBULATORY_CARE_PROVIDER_SITE_OTHER): Payer: Self-pay | Admitting: *Deleted

## 2013-06-22 DIAGNOSIS — B1920 Unspecified viral hepatitis C without hepatic coma: Secondary | ICD-10-CM

## 2013-06-22 LAB — HEPATIC FUNCTION PANEL
ALT: 12 U/L (ref 0–53)
AST: 16 U/L (ref 0–37)
Albumin: 3.9 g/dL (ref 3.5–5.2)
Alkaline Phosphatase: 84 U/L (ref 39–117)
BILIRUBIN DIRECT: 0.1 mg/dL (ref 0.0–0.3)
BILIRUBIN TOTAL: 0.4 mg/dL (ref 0.2–1.2)
Indirect Bilirubin: 0.3 mg/dL (ref 0.2–1.2)
Total Protein: 7.6 g/dL (ref 6.0–8.3)

## 2013-06-22 LAB — HEPATITIS C RNA QUANTITATIVE: HCV QUANT: NOT DETECTED [IU]/mL (ref <15)

## 2013-06-22 NOTE — Telephone Encounter (Signed)
.  Per Keith Renderri Setzer,NP the patient will need to have labs drawn in 6 months.

## 2013-07-24 ENCOUNTER — Telehealth (INDEPENDENT_AMBULATORY_CARE_PROVIDER_SITE_OTHER): Payer: Self-pay | Admitting: Internal Medicine

## 2013-07-24 NOTE — Telephone Encounter (Signed)
Error

## 2013-08-01 ENCOUNTER — Other Ambulatory Visit (INDEPENDENT_AMBULATORY_CARE_PROVIDER_SITE_OTHER): Payer: Self-pay | Admitting: *Deleted

## 2013-08-01 ENCOUNTER — Encounter (INDEPENDENT_AMBULATORY_CARE_PROVIDER_SITE_OTHER): Payer: Self-pay | Admitting: *Deleted

## 2013-08-01 DIAGNOSIS — B1920 Unspecified viral hepatitis C without hepatic coma: Secondary | ICD-10-CM

## 2013-11-20 ENCOUNTER — Emergency Department (HOSPITAL_COMMUNITY)
Admission: EM | Admit: 2013-11-20 | Discharge: 2013-11-20 | Disposition: A | Discharge status: Home / Self Care | Payer: PRIVATE HEALTH INSURANCE | Attending: Emergency Medicine | Admitting: Emergency Medicine

## 2013-11-20 ENCOUNTER — Encounter (HOSPITAL_COMMUNITY): Payer: Self-pay | Admitting: Emergency Medicine

## 2013-11-20 DIAGNOSIS — Z87891 Personal history of nicotine dependence: Secondary | ICD-10-CM | POA: Diagnosis not present

## 2013-11-20 DIAGNOSIS — Z792 Long term (current) use of antibiotics: Secondary | ICD-10-CM | POA: Insufficient documentation

## 2013-11-20 DIAGNOSIS — F411 Generalized anxiety disorder: Secondary | ICD-10-CM | POA: Insufficient documentation

## 2013-11-20 DIAGNOSIS — Z8669 Personal history of other diseases of the nervous system and sense organs: Secondary | ICD-10-CM | POA: Insufficient documentation

## 2013-11-20 DIAGNOSIS — Z862 Personal history of diseases of the blood and blood-forming organs and certain disorders involving the immune mechanism: Secondary | ICD-10-CM | POA: Diagnosis not present

## 2013-11-20 DIAGNOSIS — IMO0002 Reserved for concepts with insufficient information to code with codable children: Secondary | ICD-10-CM | POA: Diagnosis not present

## 2013-11-20 DIAGNOSIS — F329 Major depressive disorder, single episode, unspecified: Secondary | ICD-10-CM | POA: Diagnosis not present

## 2013-11-20 DIAGNOSIS — Z8614 Personal history of Methicillin resistant Staphylococcus aureus infection: Secondary | ICD-10-CM | POA: Diagnosis not present

## 2013-11-20 DIAGNOSIS — Z79899 Other long term (current) drug therapy: Secondary | ICD-10-CM | POA: Insufficient documentation

## 2013-11-20 DIAGNOSIS — R21 Rash and other nonspecific skin eruption: Secondary | ICD-10-CM

## 2013-11-20 DIAGNOSIS — Z8619 Personal history of other infectious and parasitic diseases: Secondary | ICD-10-CM | POA: Diagnosis not present

## 2013-11-20 DIAGNOSIS — F3289 Other specified depressive episodes: Secondary | ICD-10-CM | POA: Insufficient documentation

## 2013-11-20 MED ORDER — CLOTRIMAZOLE-BETAMETHASONE 1-0.05 % EX CREA: TOPICAL_CREAM | CUTANEOUS | Status: DC

## 2013-11-20 NOTE — Discharge Instructions (Signed)
Follow up with your md as planned °

## 2013-11-20 NOTE — ED Notes (Signed)
Rash to left side of abdomen.   Seen dr. Janna Arch and give septra,  antibiodic ointment and steroids.

## 2013-11-20 NOTE — ED Provider Notes (Signed)
CSN: [id number]     Arrival date & time 11/20/13  1254 History   First MD Initiated Contact with Patient 11/20/13 1304     Chief Complaint  Patient presents with  . Rash     (Consider location/radiation/quality/duration/timing/severity/associated sxs/prior Treatment) Patient is a 53 y.o. male presenting with rash. The history is provided by the patient (the pt complains of a puritic rash to his abd).  Rash Location: abdomen. Severity:  Mild Onset quality:  Sudden Timing:  Constant Progression:  Waxing and waning Chronicity:  New Context: not animal contact   Associated symptoms: no abdominal pain, no diarrhea, no fatigue and no headaches     Past Medical History  Diagnosis Date  . Paraparesis     parapalegic   . Depression   . Anxiety   . Hepatitis C   . MRSA infection   . Anemia     Patient states that he was recently told this  . Paralysis    Past Surgical History  Procedure Laterality Date  . Back surgery    . Gallbladder surgery  2010  . Femur fracture surgery  March 2012    retrograde intramedullary femoral nail  . Hernia repair    . Cholecystectomy    . Orif femur fracture Right 08/04/2012    Procedure: OPEN REDUCTION INTERNAL FIXATION (ORIF) DISTAL FEMUR FRACTURE;  Surgeon: Eldred Manges, MD;  Location: [facility] OR;  Service: Orthopedics;  Laterality: Right;  Right Retrograde Supracondylar Nail Right Femur   Family History  Problem Relation Age of Onset  . Cancer      family history   . Ovarian cancer Mother   . Healthy Sister   . Hodgkin's lymphoma Sister   . Heart disease Sister   . Healthy Sister   . Healthy Son   . Healthy Daughter   . Anemia Daughter   . Thyroid disease Daughter    History  Substance Use Topics  . Smoking status: Former Smoker -- 1.00 packs/day for 5 years    Types: Cigarettes    Quit date: 08/10/2010  . Smokeless tobacco: Never Used  . Alcohol Use: No     Comment: Patient quit drinking 1 year ago    Review of Systems   Constitutional: Negative for appetite change and fatigue.  HENT: Negative for congestion, ear discharge and sinus pressure.   Eyes: Negative for discharge.  Respiratory: Negative for cough.   Cardiovascular: Negative for chest pain.  Gastrointestinal: Negative for abdominal pain and diarrhea.  Genitourinary: Negative for frequency and hematuria.  Musculoskeletal: Negative for back pain.  Skin: Positive for rash.  Neurological: Negative for seizures and headaches.  Psychiatric/Behavioral: Negative for hallucinations.      Allergies  Review of patient's allergies indicates no known allergies.  Home Medications   Prior to Admission medications   Medication Sig Start Date End Date Taking? Authorizing Provider  methadone (DOLOPHINE) 10 MG tablet Take 20 mg by mouth 3 (three) times daily. For pain   Yes Historical Provider, MD  mupirocin ointment (BACTROBAN) 2 % Place 1 application into the nose 2 (two) times daily.   Yes Historical Provider, MD  predniSONE (STERAPRED UNI-PAK) 10 MG tablet Take 1-6 tablets by mouth See admin instructions. Dose pak taper. 11/01/13  Yes Historical Provider, MD  pregabalin (LYRICA) 75 MG capsule Take 75 mg by mouth 3 (three) times daily.    Yes Historical Provider, MD  clotrimazole-betamethasone (LOTRISONE) cream Apply to affected area 2 times daily 11/20/13   Jomarie Longs  Purnell Shoemaker, MD  sulfamethoxazole-trimethoprim (BACTRIM DS) 800-160 MG per tablet Take 1 tablet by mouth 2 (two) times daily. 10/04/13   Historical Provider, MD   BP 131/85  Pulse 69  Temp(Src) 98 F (36.7 C) (Oral)  Resp 20  Ht  (1.753 m)  Wt 130 lb (58.968 kg)  BMI 19.19 kg/m2  SpO2 99% Physical Exam  Constitutional: He is oriented to person, place, and time. He appears well-developed.  HENT:  Head: Normocephalic.  Eyes: Conjunctivae and EOM are normal. No scleral icterus.  Neck: Neck supple. No thyromegaly present.  Cardiovascular: Normal rate and regular rhythm.  Exam reveals no  gallop and no friction rub.   No murmur heard. Pulmonary/Chest: No stridor. He has no wheezes. He has no rales. He exhibits no tenderness.  Abdominal: He exhibits no distension. There is no tenderness. There is no rebound.  Musculoskeletal: Normal range of motion. He exhibits no edema.  Lymphadenopathy:    He has no cervical adenopathy.  Neurological: He is oriented to person, place, and time. He exhibits normal muscle tone. Coordination normal.  Skin: Rash noted. No erythema.  Psychiatric: He has a normal mood and affect. His behavior is normal.    ED Course  Procedures (including critical care time) Labs Review Labs Reviewed - No data to display  Imaging Review No results found.   EKG Interpretation None      MDM   Final diagnoses:  Rash        Benny Lennert, MD 11/20/13 1332

## 2013-11-28 ENCOUNTER — Encounter (INDEPENDENT_AMBULATORY_CARE_PROVIDER_SITE_OTHER): Payer: Self-pay | Admitting: *Deleted

## 2013-11-28 ENCOUNTER — Other Ambulatory Visit (INDEPENDENT_AMBULATORY_CARE_PROVIDER_SITE_OTHER): Payer: Self-pay | Admitting: *Deleted

## 2013-11-28 DIAGNOSIS — B1920 Unspecified viral hepatitis C without hepatic coma: Secondary | ICD-10-CM

## 2013-12-25 ENCOUNTER — Ambulatory Visit (INDEPENDENT_AMBULATORY_CARE_PROVIDER_SITE_OTHER): Payer: PRIVATE HEALTH INSURANCE | Admitting: Internal Medicine

## 2013-12-25 ENCOUNTER — Encounter (INDEPENDENT_AMBULATORY_CARE_PROVIDER_SITE_OTHER): Payer: Self-pay | Admitting: Internal Medicine

## 2013-12-25 VITALS — BP 140/90 | HR 80 | Temp 97.0°F | Resp 16 | Ht 69.0 in

## 2013-12-25 DIAGNOSIS — Z8619 Personal history of other infectious and parasitic diseases: Secondary | ICD-10-CM

## 2013-12-25 NOTE — Progress Notes (Signed)
Presenting complaint;  History of chronic hepatitis C.  Subjective:  Patient is 53 year old Caucasian male who has history of chronic hepatitis C. genotype 1b he was treated with Harvoni which she finished and of April 2015. HCVRNA was negative at end of treatment. He has not been able to go back for followup lab studies. He states he feels depressed. He denies being suicidal. He has no contact with 2 of his sisters. He does get visit by his stepson once a week or less often and rarely gets a cough from his biologic daughter who is [id number]. 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 He is trying to get a patent but does not have $11,000. He is hoping that his intention would be a success and he can fund stem cell research in relation to spinal cord injury. His appetite is fair. He denies nausea vomiting or abdominal pain. Bowels move every 2-3 days to once a week.   Current Medications: Outpatient Encounter Prescriptions as of 12/25/2013  Medication Sig  . methadone (DOLOPHINE) 10 MG tablet Take 20 mg by mouth 3 (three) times daily. For pain  . MULTIPLE VITAMINS PO Take by mouth daily.  . pregabalin (LYRICA) 75 MG capsule Take 75 mg by mouth 3 (three) times daily.   . vitamin C (ASCORBIC ACID) 500 MG tablet Take 500 mg by mouth daily.  . [DISCONTINUED] clotrimazole-betamethasone (LOTRISONE) cream Apply to affected area 2 times daily  . [DISCONTINUED] mupirocin ointment (BACTROBAN) 2 % Place 1 application into the nose 2 (two) times daily.  . [DISCONTINUED] predniSONE (STERAPRED UNI-PAK) 10 MG tablet Take 1-6 tablets by mouth See admin instructions. Dose pak taper.  . [DISCONTINUED] sulfamethoxazole-trimethoprim (BACTRIM DS) 800-160 MG per tablet Take 1 tablet by mouth 2 (two) times daily.     Objective: Blood pressure 140/90, pulse 80, temperature 97 F (36.1 C), temperature source Oral, resp. rate 16, height 5\' 9"  (1.753 m), weight 0 lb (0 kg). Patient is alert and in no  acute distress. He is sitting in his electric chair Conjunctiva is pink. Sclera is nonicteric Oropharyngeal mucosa is normal. No neck masses or thyromegaly noted.. Abdomen soft and nontender without organomegaly or masses  Both lower extremities with muscle wasting.  Labs/studies Results:   HCV RNA from 06/21/2013 was undetectable  Assessment:  #1. Chronic hepatitis C genotype 1b. He has completed 12 weeks of therapy with Harvoni and HCVRNA was negative at end of treatment. Followup test is needed to document SVR. #2. Depression. Patient is not suicidal. His problem is more of loneliness as he has no support system. He has an appointment with Dr. Delbert Harnesson Diego next week.   Plan:  Patient will go to lab for HCV RNA by PCR quantitative and LFTs. [facility] and see if he has some programs that he could join. Office visit in one year.

## 2013-12-25 NOTE — Patient Instructions (Signed)
Physician will call with results of blood tests when completed. 

## 2013-12-26 LAB — HEPATIC FUNCTION PANEL
ALK PHOS: 94 U/L (ref 39–117)
ALT: 11 U/L (ref 0–53)
AST: 16 U/L (ref 0–37)
Albumin: 4.1 g/dL (ref 3.5–5.2)
BILIRUBIN DIRECT: 0.2 mg/dL (ref 0.0–0.3)
BILIRUBIN INDIRECT: 0.6 mg/dL (ref 0.2–1.2)
TOTAL PROTEIN: 7.6 g/dL (ref 6.0–8.3)
Total Bilirubin: 0.8 mg/dL (ref 0.2–1.2)

## 2013-12-27 ENCOUNTER — Emergency Department (HOSPITAL_COMMUNITY)
Admission: EM | Admit: 2013-12-27 | Discharge: 2013-12-27 | Disposition: A | Discharge status: Home / Self Care | Payer: PRIVATE HEALTH INSURANCE | Attending: Emergency Medicine | Admitting: Emergency Medicine

## 2013-12-27 ENCOUNTER — Encounter (HOSPITAL_COMMUNITY): Payer: Self-pay | Admitting: Emergency Medicine

## 2013-12-27 DIAGNOSIS — Z8669 Personal history of other diseases of the nervous system and sense organs: Secondary | ICD-10-CM | POA: Diagnosis not present

## 2013-12-27 DIAGNOSIS — Z8614 Personal history of Methicillin resistant Staphylococcus aureus infection: Secondary | ICD-10-CM | POA: Insufficient documentation

## 2013-12-27 DIAGNOSIS — Z79899 Other long term (current) drug therapy: Secondary | ICD-10-CM | POA: Insufficient documentation

## 2013-12-27 DIAGNOSIS — F329 Major depressive disorder, single episode, unspecified: Secondary | ICD-10-CM | POA: Diagnosis not present

## 2013-12-27 DIAGNOSIS — F419 Anxiety disorder, unspecified: Secondary | ICD-10-CM | POA: Insufficient documentation

## 2013-12-27 DIAGNOSIS — N390 Urinary tract infection, site not specified: Secondary | ICD-10-CM | POA: Insufficient documentation

## 2013-12-27 DIAGNOSIS — R109 Unspecified abdominal pain: Secondary | ICD-10-CM | POA: Diagnosis present

## 2013-12-27 DIAGNOSIS — Z87891 Personal history of nicotine dependence: Secondary | ICD-10-CM | POA: Diagnosis not present

## 2013-12-27 DIAGNOSIS — L89314 Pressure ulcer of right buttock, stage 4: Secondary | ICD-10-CM | POA: Insufficient documentation

## 2013-12-27 DIAGNOSIS — Z862 Personal history of diseases of the blood and blood-forming organs and certain disorders involving the immune mechanism: Secondary | ICD-10-CM | POA: Diagnosis not present

## 2013-12-27 DIAGNOSIS — Z8619 Personal history of other infectious and parasitic diseases: Secondary | ICD-10-CM | POA: Diagnosis not present

## 2013-12-27 LAB — URINALYSIS, ROUTINE W REFLEX MICROSCOPIC
BILIRUBIN URINE: NEGATIVE
GLUCOSE, UA: NEGATIVE mg/dL
Ketones, ur: NEGATIVE mg/dL
Nitrite: POSITIVE — AB
Specific Gravity, Urine: 1.02 (ref 1.005–1.030)
UROBILINOGEN UA: 4 mg/dL — AB (ref 0.0–1.0)
pH: 6.5 (ref 5.0–8.0)

## 2013-12-27 LAB — URINE MICROSCOPIC-ADD ON

## 2013-12-27 MED ORDER — LEVOFLOXACIN 750 MG PO TABS: 750.0000 mg | ORAL_TABLET | Freq: Every day | ORAL | Status: AC

## 2013-12-27 MED ORDER — LEVOFLOXACIN 750 MG PO TABS
750.0000 mg | ORAL_TABLET | Freq: Once | ORAL | Status: AC
  Administered 2013-12-27: 750 mg via ORAL
  Filled 2013-12-27: qty 1

## 2013-12-27 NOTE — ED Provider Notes (Signed)
CSN: [id number]     Arrival date & time 12/27/13  1252 History  This chart was scribed for [name] Keith Yun, MD by [facility], ED Scribe. The patient was seen in APA05/APA05 and the patient's care was started at 2:20 PM.    Chief Complaint  Patient presents with  . Groin Pain   Patient is a 53 y.o. male presenting with groin pain. The history is provided by the patient. No language interpreter was used.  Groin Pain   HPI Comments: Keith BrilliantDerek Rice is a 53 y.o. male who presents to the Emergency Department complaining of left groin pain which began 3 days ago, he also notes his urine has a very strong smell. He notes on the 20th he began having  abdominal pain but it is better today but he still has mild pain. After his bowel movement the pain went away but it has returned. He has nausea, dizziness as associated symptoms. Pt notes he has a wound on right buttock and worries it maybe infected. Pt is a paraplegic He denies fever, vomiting.   Past Medical History  Diagnosis Date  . Paraparesis     parapalegic   . Depression   . Anxiety   . Hepatitis C   . MRSA infection   . Anemia     Patient states that he was recently told this  . Paralysis    Past Surgical History  Procedure Laterality Date  . Back surgery    . Gallbladder surgery  2010  . Femur fracture surgery  March 2012    retrograde intramedullary femoral nail  . Hernia repair    . Cholecystectomy    . Orif femur fracture Right 08/04/2012    Procedure: OPEN REDUCTION INTERNAL FIXATION (ORIF) DISTAL FEMUR FRACTURE;  Surgeon: Eldred MangesMark C Yates, MD;  Location: [facility] OR;  Service: Orthopedics;  Laterality: Right;  Right Retrograde Supracondylar Nail Right Femur   Family History  Problem Relation Age of Onset  . Cancer      family history   . Ovarian cancer Mother   . Healthy Sister   . Hodgkin's lymphoma Sister   . Heart disease Sister   . Healthy Sister   . Healthy Son   . Healthy Daughter   . Anemia Daughter   .  Thyroid disease Daughter    History  Substance Use Topics  . Smoking status: Former Smoker -- 1.00 packs/day for 5 years    Types: Cigarettes    Quit date: 08/10/2010  . Smokeless tobacco: Never Used  . Alcohol Use: No     Comment: Patient quit drinking 1 year ago    Review of Systems  Constitutional:       Per HPI, otherwise negative  HENT:       Per HPI, otherwise negative  Respiratory:       Per HPI, otherwise negative  Cardiovascular:       Per HPI, otherwise negative  Gastrointestinal: Negative for vomiting.  Endocrine:       Negative aside from HPI  Genitourinary:       Neg aside from HPI   Musculoskeletal:       Per HPI, otherwise negative  Skin: Negative.   Neurological: Negative for syncope.      Allergies  Review of patient's allergies indicates no known allergies.  Home Medications   Prior to Admission medications   Medication Sig Start Date End Date Taking? Authorizing Provider  methadone (DOLOPHINE) 10 MG tablet Take 20 mg by  mouth 3 (three) times daily. For pain   Yes Historical Provider, MD  MULTIPLE VITAMINS PO Take 1 tablet by mouth daily.    Yes Historical Provider, MD  pregabalin (LYRICA) 75 MG capsule Take 75 mg by mouth 3 (three) times daily.    Yes Historical Provider, MD  vitamin C (ASCORBIC ACID) 500 MG tablet Take 500 mg by mouth daily.   Yes Historical Provider, MD   BP 118/93  Pulse 79  Temp(Src) 97.9 F (36.6 C) (Oral)  Resp 24  Ht 5\' 9"  (1.753 m)  Wt 140 lb (63.504 kg)  BMI 20.67 kg/m2  SpO2 100% Physical Exam  Nursing note and vitals reviewed. Constitutional: He is oriented to person, place, and time. He appears well-developed. No distress.  HENT:  Head: Normocephalic and atraumatic.  Eyes: Conjunctivae and EOM are normal.  Cardiovascular: Normal rate and regular rhythm.   Pulmonary/Chest: Effort normal. No stridor. No respiratory distress.  Abdominal: He exhibits no distension.  TTP to the left inguinal  Musculoskeletal:  He exhibits no edema.  Neurological: He is alert and oriented to person, place, and time.  paraplegic  Skin: Skin is warm and dry.  Right medial inferior buttock he has a 3 cm in length open wound with no surrounding erythema or discharge.  Psychiatric: He has a normal mood and affect.    ED Course  Procedures  DIAGNOSTIC STUDIES: Oxygen Saturation is 100% on room air, normal by my interpretation.    COORDINATION OF CARE: 2: PM Discussed treatment plan with pt at bedside and pt agreed to plan.   Labs Review Labs Reviewed  URINALYSIS, ROUTINE W REFLEX MICROSCOPIC - Abnormal; Notable for the following:    APPearance CLOUDY (*)    Hgb urine dipstick SMALL (*)    Protein, ur TRACE (*)    Urobilinogen, UA 4.0 (*)    Nitrite POSITIVE (*)    Leukocytes, UA LARGE (*)    All other components within normal limits  URINE MICROSCOPIC-ADD ON - Abnormal; Notable for the following:    Bacteria, UA MANY (*)    All other components within normal limits    I reviewed the patient's EKG from May of this year.  No prolonged QTC. Patient has no chest pain, nor any concerns of near syncope, syncope, weakness. On repeat exam the patient is in no distress, sitting upright, with no new complaints.  He will follow up with primary care and wound care MDM   Final diagnoses:  UTI (lower urinary tract infection)  Pressure ulcer of buttock, right, stage IV   this patient with paraplegia, chronic pain presents with no lower abdominal pain, soft, non-positional abdomen, no fever, chills, evidence for bacteremia, sepsis. Evidence for urinary tract infection the patient was started on antibiotics, will follow up with primary care.  He was provided to wound care center for further evaluation and management of his chronic, nonhealing pressure ulcer as well.    I personally performed the services described in this documentation, which was scribed in my presence. The recorded information has been reviewed and  is accurate.      [name] Keith Lindfors, MD 12/27/13 [phone]

## 2013-12-27 NOTE — ED Notes (Signed)
Left groin pain every night past 3 nights.  States his urine is smelling real strong.  Also has pressure sore to right sacral area he thinks is getting worse.

## 2013-12-27 NOTE — Discharge Instructions (Signed)
As discussed, it is important that you follow up as soon as possible with your physician for continued management of your condition. ° °If you develop any new, or concerning changes in your condition, please return to the emergency department immediately. ° °

## 2014-01-03 ENCOUNTER — Telehealth (INDEPENDENT_AMBULATORY_CARE_PROVIDER_SITE_OTHER): Payer: Self-pay | Admitting: *Deleted

## 2014-01-03 DIAGNOSIS — B182 Chronic viral hepatitis C: Secondary | ICD-10-CM

## 2014-01-03 NOTE — Telephone Encounter (Signed)
Per Dr.Rehman the patient will need to have labs drawn. This was to have been drawn at the time of Hepatic Profile and was not. Sol Stas only collected for the Hepatic Profile. Patient called and made aware that he needs to get this lab drawn.

## 2014-01-21 LAB — HEPATITIS C RNA QUANTITATIVE: HCV QUANT: NOT DETECTED [IU]/mL (ref <15)

## 2014-06-13 ENCOUNTER — Ambulatory Visit (HOSPITAL_COMMUNITY): Payer: Self-pay | Admitting: Psychology

## 2014-10-09 ENCOUNTER — Encounter (INDEPENDENT_AMBULATORY_CARE_PROVIDER_SITE_OTHER): Payer: Self-pay | Admitting: *Deleted

## 2014-12-26 ENCOUNTER — Ambulatory Visit (INDEPENDENT_AMBULATORY_CARE_PROVIDER_SITE_OTHER): Payer: Self-pay | Admitting: Internal Medicine

## 2014-12-30 ENCOUNTER — Encounter (INDEPENDENT_AMBULATORY_CARE_PROVIDER_SITE_OTHER): Payer: Self-pay | Admitting: *Deleted

## 2015-01-27 ENCOUNTER — Encounter (HOSPITAL_COMMUNITY): Payer: Self-pay | Admitting: *Deleted

## 2015-01-27 ENCOUNTER — Emergency Department (HOSPITAL_COMMUNITY): Payer: Medicare Other

## 2015-01-27 ENCOUNTER — Inpatient Hospital Stay (HOSPITAL_COMMUNITY)
Admission: EM | Admit: 2015-01-27 | Discharge: 2015-01-29 | DRG: 540 | Disposition: A | Discharge status: Home Health | Payer: Medicare Other | Attending: Family Medicine | Admitting: Family Medicine

## 2015-01-27 DIAGNOSIS — Z79891 Long term (current) use of opiate analgesic: Secondary | ICD-10-CM

## 2015-01-27 DIAGNOSIS — F329 Major depressive disorder, single episode, unspecified: Secondary | ICD-10-CM | POA: Diagnosis present

## 2015-01-27 DIAGNOSIS — B182 Chronic viral hepatitis C: Secondary | ICD-10-CM

## 2015-01-27 DIAGNOSIS — M8660 Other chronic osteomyelitis, unspecified site: Principal | ICD-10-CM | POA: Diagnosis present

## 2015-01-27 DIAGNOSIS — L98499 Non-pressure chronic ulcer of skin of other sites with unspecified severity: Secondary | ICD-10-CM | POA: Diagnosis present

## 2015-01-27 DIAGNOSIS — R109 Unspecified abdominal pain: Secondary | ICD-10-CM | POA: Diagnosis present

## 2015-01-27 DIAGNOSIS — B1920 Unspecified viral hepatitis C without hepatic coma: Secondary | ICD-10-CM | POA: Diagnosis present

## 2015-01-27 DIAGNOSIS — R748 Abnormal levels of other serum enzymes: Secondary | ICD-10-CM | POA: Diagnosis present

## 2015-01-27 DIAGNOSIS — R945 Abnormal results of liver function studies: Secondary | ICD-10-CM

## 2015-01-27 DIAGNOSIS — K604 Rectal fistula: Secondary | ICD-10-CM | POA: Diagnosis present

## 2015-01-27 DIAGNOSIS — D649 Anemia, unspecified: Secondary | ICD-10-CM | POA: Diagnosis present

## 2015-01-27 DIAGNOSIS — R7989 Other specified abnormal findings of blood chemistry: Secondary | ICD-10-CM

## 2015-01-27 DIAGNOSIS — Z8614 Personal history of Methicillin resistant Staphylococcus aureus infection: Secondary | ICD-10-CM

## 2015-01-27 DIAGNOSIS — R740 Nonspecific elevation of levels of transaminase and lactic acid dehydrogenase [LDH]: Secondary | ICD-10-CM

## 2015-01-27 DIAGNOSIS — G894 Chronic pain syndrome: Secondary | ICD-10-CM | POA: Diagnosis present

## 2015-01-27 DIAGNOSIS — F419 Anxiety disorder, unspecified: Secondary | ICD-10-CM | POA: Diagnosis present

## 2015-01-27 DIAGNOSIS — R1013 Epigastric pain: Secondary | ICD-10-CM | POA: Diagnosis present

## 2015-01-27 DIAGNOSIS — G8220 Paraplegia, unspecified: Secondary | ICD-10-CM | POA: Diagnosis present

## 2015-01-27 DIAGNOSIS — Z87891 Personal history of nicotine dependence: Secondary | ICD-10-CM

## 2015-01-27 DIAGNOSIS — L8990 Pressure ulcer of unspecified site, unspecified stage: Secondary | ICD-10-CM | POA: Diagnosis present

## 2015-01-27 DIAGNOSIS — R1011 Right upper quadrant pain: Secondary | ICD-10-CM

## 2015-01-27 DIAGNOSIS — L89899 Pressure ulcer of other site, unspecified stage: Secondary | ICD-10-CM | POA: Diagnosis present

## 2015-01-27 DIAGNOSIS — R7401 Elevation of levels of liver transaminase levels: Secondary | ICD-10-CM

## 2015-01-27 LAB — CBC WITH DIFFERENTIAL/PLATELET
Basophils Absolute: 0 10*3/uL (ref 0.0–0.1)
Basophils Relative: 0 %
Eosinophils Absolute: 0 10*3/uL (ref 0.0–0.7)
Eosinophils Relative: 0 %
HCT: 41.2 % (ref 39.0–52.0)
Hemoglobin: 13.9 g/dL (ref 13.0–17.0)
Lymphocytes Relative: 7 %
Lymphs Abs: 0.8 10*3/uL (ref 0.7–4.0)
MCH: 28.6 pg (ref 26.0–34.0)
MCHC: 33.7 g/dL (ref 30.0–36.0)
MCV: 84.8 fL (ref 78.0–100.0)
Monocytes Absolute: 0.3 10*3/uL (ref 0.1–1.0)
Monocytes Relative: 3 %
Neutro Abs: 10.7 10*3/uL — ABNORMAL HIGH (ref 1.7–7.7)
Neutrophils Relative %: 90 %
Platelets: 275 10*3/uL (ref 150–400)
RBC: 4.86 MIL/uL (ref 4.22–5.81)
RDW: 14.1 % (ref 11.5–15.5)
WBC: 11.8 10*3/uL — ABNORMAL HIGH (ref 4.0–10.5)

## 2015-01-27 LAB — COMPREHENSIVE METABOLIC PANEL
ALT: 215 U/L — ABNORMAL HIGH (ref 17–63)
AST: 334 U/L — ABNORMAL HIGH (ref 15–41)
Albumin: 3.8 g/dL (ref 3.5–5.0)
Alkaline Phosphatase: 269 U/L — ABNORMAL HIGH (ref 38–126)
Anion gap: 9 (ref 5–15)
BUN: 9 mg/dL (ref 6–20)
CO2: 26 mmol/L (ref 22–32)
Calcium: 8.9 mg/dL (ref 8.9–10.3)
Chloride: 101 mmol/L (ref 101–111)
Creatinine, Ser: 0.64 mg/dL (ref 0.61–1.24)
GFR calc Af Amer: >60 mL/min (ref >60)
GFR calc non Af Amer: >60 mL/min (ref >60)
Glucose, Bld: 216 mg/dL — ABNORMAL HIGH (ref 65–99)
Potassium: 3.6 mmol/L (ref 3.5–5.1)
Sodium: 136 mmol/L (ref 135–145)
Total Bilirubin: 1.4 mg/dL — ABNORMAL HIGH (ref 0.3–1.2)
Total Protein: 8 g/dL (ref 6.5–8.1)

## 2015-01-27 LAB — LIPASE, BLOOD: Lipase: 21 U/L (ref 11–51)

## 2015-01-27 LAB — LACTIC ACID, PLASMA: Lactic Acid, Venous: 1.4 mmol/L (ref 0.5–2.0)

## 2015-01-27 MED ORDER — HYDROMORPHONE HCL 1 MG/ML IJ SOLN
1.0000 mg | Freq: Once | INTRAMUSCULAR | Status: AC
  Administered 2015-01-27: 1 mg via INTRAVENOUS
  Filled 2015-01-27: qty 1

## 2015-01-27 MED ORDER — HYDROMORPHONE HCL 1 MG/ML IJ SOLN
INTRAMUSCULAR | Status: AC
  Filled 2015-01-27: qty 1

## 2015-01-27 MED ORDER — HYDROMORPHONE HCL 1 MG/ML IJ SOLN
1.0000 mg | Freq: Once | INTRAMUSCULAR | Status: AC
  Administered 2015-01-28: 1 mg via INTRAVENOUS
  Filled 2015-01-27: qty 1

## 2015-01-27 MED ORDER — ONDANSETRON HCL 4 MG/2ML IJ SOLN
4.0000 mg | Freq: Once | INTRAMUSCULAR | Status: AC
  Administered 2015-01-27: 4 mg via INTRAVENOUS
  Filled 2015-01-27: qty 2

## 2015-01-27 MED ORDER — LORAZEPAM 2 MG/ML IJ SOLN
1.0000 mg | Freq: Once | INTRAMUSCULAR | Status: AC
  Administered 2015-01-28: 1 mg via INTRAVENOUS
  Filled 2015-01-27: qty 1

## 2015-01-27 MED ORDER — GI COCKTAIL ~~LOC~~
30.0000 mL | Freq: Once | ORAL | Status: AC
  Administered 2015-01-28: 30 mL via ORAL
  Filled 2015-01-27: qty 30

## 2015-01-27 MED ORDER — HYDROMORPHONE HCL 1 MG/ML IJ SOLN
1.0000 mg | Freq: Once | INTRAMUSCULAR | Status: AC
Start: 2015-01-27 — End: 2015-01-27
  Administered 2015-01-27: 1 mg via INTRAVENOUS

## 2015-01-27 MED ORDER — SODIUM CHLORIDE 0.9 % IV BOLUS (SEPSIS)
1000.0000 mL | Freq: Once | INTRAVENOUS | Status: AC
  Administered 2015-01-27: 1000 mL via INTRAVENOUS

## 2015-01-27 MED ORDER — FAMOTIDINE IN NACL 20-0.9 MG/50ML-% IV SOLN
20.0000 mg | Freq: Once | INTRAVENOUS | Status: AC
  Administered 2015-01-28: 20 mg via INTRAVENOUS
  Filled 2015-01-27: qty 50

## 2015-01-27 NOTE — ED Provider Notes (Signed)
CSN: [id number]     Arrival date & time 01/27/15  2033 History  By signing my name below, I, Budd Palmer, attest that this documentation has been prepared under the direction and in the presence of Raeford Razor, MD. Electronically Signed: Budd Palmer, ED Scribe. 01/27/2015. 9:56 PM.    Chief Complaint  Patient presents with  . Abdominal Pain   The history is provided by the patient. No language interpreter was used.   HPI Comments: [name] is a 54 y.o. male former smoker who presents to the Emergency Department complaining of upper abdominal pain onset a few hours ago. He reports associated chills and lower back pain. He states he has had a tooth ache for the past week and has been taking a large amount of Excedrin for it. He notes he also takes methadone and Prevacid. He reports a PMHx of paralysis and states he uses a catheter to void urine. He also notes a PSHx of cholecystectomy. He denies a PMHx of gastric ulcers. Pt denies vomiting, fever, and difficulty urinating.   Past Medical History  Diagnosis Date  . Paraparesis (HCC)     parapalegic   . Depression   . Anxiety   . Hepatitis C   . MRSA infection   . Anemia     Patient states that he was recently told this  . Paralysis [facility])    Past Surgical History  Procedure Laterality Date  . Back surgery    . Gallbladder surgery  2010  . Femur fracture surgery  March 2012    retrograde intramedullary femoral nail  . Hernia repair    . Cholecystectomy    . Orif femur fracture Right 08/04/2012    Procedure: OPEN REDUCTION INTERNAL FIXATION (ORIF) DISTAL FEMUR FRACTURE;  Surgeon: Eldred Manges, MD;  Location: [facility] OR;  Service: Orthopedics;  Laterality: Right;  Right Retrograde Supracondylar Nail Right Femur   Family History  Problem Relation Age of Onset  . Cancer      family history   . Ovarian cancer Mother   . Healthy Sister   . Hodgkin's lymphoma Sister   . Heart disease Sister   . Healthy Sister   . Healthy Son    . Healthy Daughter   . Anemia Daughter   . Thyroid disease Daughter    Social History  Substance Use Topics  . Smoking status: Former Smoker -- 1.00 packs/day for 5 years    Types: Cigarettes    Quit date: 08/10/2010  . Smokeless tobacco: Never Used  . Alcohol Use: No     Comment: Patient quit drinking 1 year ago    Review of Systems  Constitutional: Positive for chills. Negative for fever.  Gastrointestinal: Positive for abdominal pain. Negative for vomiting.  Genitourinary: Negative for difficulty urinating.  Musculoskeletal: Positive for back pain.  All other systems reviewed and are negative.   Allergies  Review of patient's allergies indicates no known allergies.  Home Medications   Prior to Admission medications   Medication Sig Start Date End Date Taking? Authorizing Provider  methadone (DOLOPHINE) 10 MG tablet Take 20 mg by mouth 3 (three) times daily. For pain    Historical Provider, MD  MULTIPLE VITAMINS PO Take 1 tablet by mouth daily.     Historical Provider, MD  pregabalin (LYRICA) 75 MG capsule Take 75 mg by mouth 3 (three) times daily.     Historical Provider, MD  vitamin C (ASCORBIC ACID) 500 MG tablet Take 500 mg by mouth  daily.    Historical Provider, MD   BP 190/106 mmHg  Pulse 78  Temp(Src) 97.5 F (36.4 C) (Oral)  Resp 22  SpO2 100% Physical Exam  Constitutional: He appears well-developed and well-nourished. He appears distressed.  Pt is yelling and moaning. When speaking, moaning stops.  HENT:  Head: Normocephalic and atraumatic.  Eyes: Conjunctivae are normal. Right eye exhibits no discharge. Left eye exhibits no discharge.  Pulmonary/Chest: Effort normal. No respiratory distress.  Abdominal: He exhibits no distension. There is tenderness. There is guarding. There is no rebound.  TTP in epigastrium with voluntary guarding. No distension or rebound. RUQ incisional scar.  Neurological: He is alert. Coordination normal.  Skin: Skin is warm and  dry. No rash noted. He is not diaphoretic. No erythema.  Psychiatric: He has a normal mood and affect.  Nursing note and vitals reviewed.   ED Course  Procedures  DIAGNOSTIC STUDIES: Oxygen Saturation is 100% on RA, normal by my interpretation.    COORDINATION OF CARE: 9:26 PM - Discussed plans to order diagnostic studies and pain medication. Pt advised of plan for treatment and pt agrees.  Labs Review Labs Reviewed  CBC WITH DIFFERENTIAL/PLATELET - Abnormal; Notable for the following:    WBC 11.8 (*)    Neutro Abs 10.7 (*)    All other components within normal limits  COMPREHENSIVE METABOLIC PANEL - Abnormal; Notable for the following:    Glucose, Bld 216 (*)    AST 334 (*)    ALT 215 (*)    Alkaline Phosphatase 269 (*)    Total Bilirubin 1.4 (*)    All other components within normal limits  LIPASE, BLOOD  LACTIC ACID, PLASMA    Imaging Review Dg Abd Acute W/chest  01/27/2015  CLINICAL DATA:  Upper abdominal pain. Epigastric pain. Multiple doses of Excedrin over the past week due to the toothache. EXAM: DG ABDOMEN ACUTE W/ 1V CHEST COMPARISON:  Chest 08/03/2012 FINDINGS: Postoperative posterior fixation of the thoracolumbar spine. Surgical clips in the right upper quadrant. IVC filter. Shallow inspiration. Normal heart size and pulmonary vascularity. No focal airspace disease or consolidation in the lungs. No blunting of costophrenic angles. No pneumothorax. Mediastinal contours appear intact. Scattered gas and stool in the colon. No small or large bowel distention. No free intra-abdominal air. No abnormal air-fluid levels. No radiopaque stones. IMPRESSION: No evidence of active pulmonary disease. Nonobstructive bowel gas pattern. Electronically Signed   By: Burman NievesWilliam  Stevens M.D.   On: 01/27/2015 22:38   I have personally reviewed and evaluated these images and lab results as part of my medical decision-making.   EKG Interpretation None      MDM   Final diagnoses:   Epigastric pain  Abnormal LFTs    48yM with abdominal pain. Epigastric. Reports heavy NSAID use recently because of toothache. Consider gastritis/PUD. LFTs elevated.  Hx of Hep C followed by GI. Underwent treatment with sustained virologic response at 6 months. Reports prior ETOH abuse but no significant recent usage. Lipase normal. S/p cholecystectomy.  Afebrile. Cannot obtain US at this time of night. Will CT. Disposition pending image results and symptoms management. If discharged has established GI, Dr Karilyn Cotaehman.   I personally preformed the services scribed in my presence. The recorded information has been reviewed is accurate. Raeford RazorStephen Valene Villa, MD.   Raeford RazorStephen Afsheen Antony, MD 01/28/15 [id number]

## 2015-01-27 NOTE — ED Notes (Signed)
Pt c/o upper abd pain, reports that he has been taking multiple doses of Excedrin over the past week due to toothache,

## 2015-01-28 ENCOUNTER — Inpatient Hospital Stay (HOSPITAL_COMMUNITY): Payer: Medicare Other

## 2015-01-28 ENCOUNTER — Encounter (HOSPITAL_COMMUNITY): Payer: Self-pay | Admitting: *Deleted

## 2015-01-28 DIAGNOSIS — D649 Anemia, unspecified: Secondary | ICD-10-CM | POA: Diagnosis present

## 2015-01-28 DIAGNOSIS — L89899 Pressure ulcer of other site, unspecified stage: Secondary | ICD-10-CM | POA: Diagnosis present

## 2015-01-28 DIAGNOSIS — R1013 Epigastric pain: Secondary | ICD-10-CM | POA: Diagnosis present

## 2015-01-28 DIAGNOSIS — G894 Chronic pain syndrome: Secondary | ICD-10-CM | POA: Diagnosis present

## 2015-01-28 DIAGNOSIS — F329 Major depressive disorder, single episode, unspecified: Secondary | ICD-10-CM | POA: Diagnosis present

## 2015-01-28 DIAGNOSIS — F419 Anxiety disorder, unspecified: Secondary | ICD-10-CM | POA: Diagnosis present

## 2015-01-28 DIAGNOSIS — G8220 Paraplegia, unspecified: Secondary | ICD-10-CM

## 2015-01-28 DIAGNOSIS — M8660 Other chronic osteomyelitis, unspecified site: Secondary | ICD-10-CM | POA: Diagnosis present

## 2015-01-28 DIAGNOSIS — Z87891 Personal history of nicotine dependence: Secondary | ICD-10-CM | POA: Diagnosis not present

## 2015-01-28 DIAGNOSIS — K604 Rectal fistula: Secondary | ICD-10-CM | POA: Diagnosis present

## 2015-01-28 DIAGNOSIS — R748 Abnormal levels of other serum enzymes: Secondary | ICD-10-CM | POA: Diagnosis present

## 2015-01-28 DIAGNOSIS — L8990 Pressure ulcer of unspecified site, unspecified stage: Secondary | ICD-10-CM | POA: Diagnosis not present

## 2015-01-28 DIAGNOSIS — B1920 Unspecified viral hepatitis C without hepatic coma: Secondary | ICD-10-CM | POA: Diagnosis present

## 2015-01-28 DIAGNOSIS — Z79891 Long term (current) use of opiate analgesic: Secondary | ICD-10-CM | POA: Diagnosis not present

## 2015-01-28 DIAGNOSIS — L98499 Non-pressure chronic ulcer of skin of other sites with unspecified severity: Secondary | ICD-10-CM | POA: Diagnosis present

## 2015-01-28 DIAGNOSIS — Z8614 Personal history of Methicillin resistant Staphylococcus aureus infection: Secondary | ICD-10-CM | POA: Diagnosis not present

## 2015-01-28 DIAGNOSIS — R109 Unspecified abdominal pain: Secondary | ICD-10-CM | POA: Diagnosis present

## 2015-01-28 LAB — CREATININE, SERUM: Creatinine, Ser: 0.71 mg/dL (ref 0.61–1.24)

## 2015-01-28 LAB — CBC
HEMATOCRIT: 41.2 % (ref 39.0–52.0)
HEMOGLOBIN: 13.9 g/dL (ref 13.0–17.0)
MCH: 28.4 pg (ref 26.0–34.0)
MCHC: 33.7 g/dL (ref 30.0–36.0)
MCV: 84.3 fL (ref 78.0–100.0)
Platelets: 230 10*3/uL (ref 150–400)
RBC: 4.89 MIL/uL (ref 4.22–5.81)
RDW: 14.2 % (ref 11.5–15.5)
WBC: 18.3 10*3/uL — ABNORMAL HIGH (ref 4.0–10.5)

## 2015-01-28 LAB — C DIFFICILE QUICK SCREEN W PCR REFLEX
C DIFFICILE (CDIFF) INTERP: NEGATIVE
C DIFFICILE (CDIFF) TOXIN: NEGATIVE
C DIFFICLE (CDIFF) ANTIGEN: NEGATIVE

## 2015-01-28 LAB — MRSA PCR SCREENING: MRSA by PCR: NEGATIVE

## 2015-01-28 MED ORDER — VANCOMYCIN HCL IN DEXTROSE 750-5 MG/150ML-% IV SOLN
750.0000 mg | Freq: Three times a day (TID) | INTRAVENOUS | Status: DC
  Administered 2015-01-28 – 2015-01-29 (×4): 750 mg via INTRAVENOUS
  Filled 2015-01-28 (×10): qty 150

## 2015-01-28 MED ORDER — SODIUM CHLORIDE 0.9 % IV SOLN
INTRAVENOUS | Status: AC
  Administered 2015-01-28: 07:00:00 via INTRAVENOUS

## 2015-01-28 MED ORDER — ASPIRIN-ACETAMINOPHEN-CAFFEINE 250-250-65 MG PO TABS
2.0000 | ORAL_TABLET | Freq: Four times a day (QID) | ORAL | Status: DC | PRN
  Filled 2015-01-28: qty 2

## 2015-01-28 MED ORDER — MORPHINE SULFATE (PF) 2 MG/ML IV SOLN
2.0000 mg | INTRAVENOUS | Status: DC | PRN
  Administered 2015-01-28 – 2015-01-29 (×3): 2 mg via INTRAVENOUS
  Filled 2015-01-28 (×5): qty 1

## 2015-01-28 MED ORDER — ONDANSETRON HCL 4 MG PO TABS: 4.0000 mg | ORAL_TABLET | Freq: Four times a day (QID) | ORAL | Status: DC | PRN

## 2015-01-28 MED ORDER — VANCOMYCIN HCL IN DEXTROSE 1-5 GM/200ML-% IV SOLN
INTRAVENOUS | Status: AC
  Filled 2015-01-28: qty 200

## 2015-01-28 MED ORDER — ACETAMINOPHEN 325 MG PO TABS: 650.0000 mg | ORAL_TABLET | Freq: Four times a day (QID) | ORAL | Status: DC | PRN

## 2015-01-28 MED ORDER — ONDANSETRON HCL 4 MG/2ML IJ SOLN: 4.0000 mg | Freq: Four times a day (QID) | INTRAMUSCULAR | Status: DC | PRN

## 2015-01-28 MED ORDER — PREGABALIN 75 MG PO CAPS
75.0000 mg | ORAL_CAPSULE | Freq: Three times a day (TID) | ORAL | Status: DC
  Administered 2015-01-28 – 2015-01-29 (×4): 75 mg via ORAL
  Filled 2015-01-28 (×4): qty 1

## 2015-01-28 MED ORDER — VANCOMYCIN HCL IN DEXTROSE 1-5 GM/200ML-% IV SOLN
1000.0000 mg | Freq: Once | INTRAVENOUS | Status: AC
  Administered 2015-01-28: 1000 mg via INTRAVENOUS
  Filled 2015-01-28: qty 200

## 2015-01-28 MED ORDER — ZOLPIDEM TARTRATE 5 MG PO TABS
5.0000 mg | ORAL_TABLET | Freq: Every evening | ORAL | Status: DC | PRN
  Administered 2015-01-28: 5 mg via ORAL
  Filled 2015-01-28: qty 1

## 2015-01-28 MED ORDER — ACETAMINOPHEN 650 MG RE SUPP
650.0000 mg | Freq: Four times a day (QID) | RECTAL | Status: DC | PRN
Start: 2015-01-28 — End: 2015-01-29

## 2015-01-28 MED ORDER — ENOXAPARIN SODIUM 40 MG/0.4ML ~~LOC~~ SOLN
40.0000 mg | SUBCUTANEOUS | Status: DC
  Administered 2015-01-28 – 2015-01-29 (×2): 40 mg via SUBCUTANEOUS
  Filled 2015-01-28 (×2): qty 0.4

## 2015-01-28 MED ORDER — METHADONE HCL 10 MG PO TABS
20.0000 mg | ORAL_TABLET | Freq: Three times a day (TID) | ORAL | Status: DC
  Administered 2015-01-28 – 2015-01-29 (×5): 20 mg via ORAL
  Filled 2015-01-28 (×5): qty 2

## 2015-01-28 MED ORDER — IOHEXOL 300 MG/ML  SOLN
75.0000 mL | Freq: Once | INTRAMUSCULAR | Status: AC | PRN
  Administered 2015-01-28: 75 mL via INTRAVENOUS

## 2015-01-28 NOTE — Discharge Instructions (Signed)

## 2015-01-28 NOTE — Care Management Note (Signed)
Case Management Note  Patient Details  Name: Keith BrilliantDerek Rice MRN: [id number] Date of Birth: 09-21-1960  Subjective/Objective:                  Pt admitted from home with abd pain and possible osteomylitis. Pt lives alone and will return home at discharge. Pt is a paraplegic but is very independent with most ADL's. Pt has a hospital bed and motorized wheelchair. Pt self caths.  Action/Plan: No CM needs noted.  Expected Discharge Date:                  Expected Discharge Plan:  Home/Self Care  In-House Referral:  NA  Discharge planning Services  CM Consult  Post Acute Care Choice:  NA Choice offered to:  NA  DME Arranged:    DME Agency:     HH Arranged:    HH Agency:     Status of Service:  Completed, signed off  Medicare Important Message Given:    Date Medicare IM Given:    Medicare IM give by:    Date Additional Medicare IM Given:    Additional Medicare Important Message give by:     If discussed at Long Length of Stay Meetings, dates discussed:    Additional Comments:  Cheryl FlashBlackwell, [place] Osmer Crowder, RN 01/28/2015, 1:08 PM

## 2015-01-28 NOTE — Progress Notes (Signed)
Patient admitted with epigastric abdominal pain status post cholecystectomy sonography as well CT of abdomen are unrevealing and is now mostly resolved he is paraplegic with chronic pressure ulcer and right gluteal region now with CT finding suggestive of possible concomitant osteomyelitis currently on vancomycin IV per protocol [name] MVH:[id number] DOB: [birth year] DOA: 01/27/2015 PCP: Isabella Stalling, MD             Physical Exam: Blood pressure 127/83, pulse 110, temperature 97.5 F (36.4 C), temperature source Oral, resp. rate 18, height  (1.753 m), weight 144 lb 2.9 oz (65.4 kg), SpO2 97 %.lungs clear to A&P no rales wheeze rhonchi heart regular rhythm no murmurs gallops rubs abdomen soft nontender bowel sounds normoactive no peristaltic rushes no guarding or rebound or masses no megaly diffuse pressure ulcer of right gluteal region with purulent drainage which is chronic according to patient   Investigations:  Recent Results (from the past 240 hour(s))  MRSA PCR Screening     Status: None   Collection Time: 01/28/15  6:50 AM  Result Value Ref Range Status   MRSA by PCR NEGATIVE NEGATIVE Final    Comment:        The GeneXpert MRSA Assay (FDA approved for NASAL specimens only), is one component of a comprehensive MRSA colonization surveillance program. It is not intended to diagnose MRSA infection nor to guide or monitor treatment for MRSA infections.      Basic Metabolic Panel:  Recent Labs  84/13/24 2121 01/28/15 0646  NA 136  --   K 3.6  --   CL 101  --   CO2 26  --   GLUCOSE 216*  --   BUN 9  --   CREATININE 0.64 0.71  CALCIUM 8.9  --    Liver Function Tests:  Recent Labs  01/27/15 2121  AST 334*  ALT 215*  ALKPHOS 269*  BILITOT 1.4*  PROT 8.0  ALBUMIN 3.8     CBC:  Recent Labs  01/27/15 2121 01/28/15 0646  WBC 11.8* 18.3*  NEUTROABS 10.7*  --   HGB 13.9 13.9  HCT 41.2 41.2  MCV 84.8 84.3  PLT 275 230    US  Abdomen Complete  01/28/2015  CLINICAL DATA:  Hepatitis-C.  Chronic abdominal pain. EXAM: ULTRASOUND ABDOMEN COMPLETE COMPARISON:  CT 01/28/2015 FINDINGS: Gallbladder: Prior cholecystectomy Common bile duct: Diameter: 7 mm in diameter where visualized. The distal duct is obscured by bowel gas. No visible stones Liver: No focal lesion identified. Within normal limits in parenchymal echogenicity. Mild intrahepatic ductal dilatation. IVC: No abnormality visualized. Pancreas: Visualized portion unremarkable. Spleen: Size and appearance within normal limits. Right Kidney: Length: 12.1 cm. Echogenicity within normal limits. No mass or hydronephrosis visualized. Left Kidney: Length: 12.6 cm. Echogenicity within normal limits. No mass or hydronephrosis visualized. Abdominal aorta: No aneurysm visualized. Other findings: None. IMPRESSION: Mild intrahepatic biliary ductal dilatation. The visualized proximal common bile duct is upper limits normal in caliber at 7 mm. Findings could be related to post cholecystectomy state. If there is clinical concern and further evaluation is felt warranted, MRCP may be beneficial. Electronically Signed   By: Charlett Nose M.D.   On: 01/28/2015 10:17   Ct Abdomen Pelvis W Contrast  01/28/2015  CLINICAL DATA:  Upper abdominal pain, onset a few hours ago. Chills and low back pain history of paralysis and uses a catheter to void urine. EXAM: CT ABDOMEN AND PELVIS WITH CONTRAST TECHNIQUE: Multidetector CT imaging of the abdomen and pelvis was  performed using the standard protocol following bolus administration of intravenous contrast. CONTRAST:  75mL OMNIPAQUE IOHEXOL 300 MG/ML  SOLN COMPARISON:  CT pelvis 08/18/2008 FINDINGS: Atelectasis in the lung bases. Residual contrast material in the esophagus without significant dilatation suggesting reflux or dysmotility. Surgical absence of the gallbladder. Mild intra and extrahepatic bile duct dilatation. This may be physiologic post  cholecystectomy. No obstructing stone or mass is identified. The pancreas, spleen, adrenal glands, kidneys, and retroperitoneal lymph nodes are unremarkable. Calcification of abdominal aorta without aneurysm. Inferior vena caval filter. Stomach is distended with food. Small bowel are decompressed. Stool-filled colon without abnormal distention. No free air or free fluid in the abdomen. Abdominal wall musculature appears intact. Pelvis: Appendix is not identified. Bladder wall is not thickened. No free or loculated pelvic fluid collections. No pelvic mass or lymphadenopathy. There is fatty atrophy of the hip muscles bilaterally. There is infiltration and skin thickening in the right posterior perineal region consistent with a fistula. This extends to the inferior pubic ramus with evidence of bone remodeling suggesting chronic osteomyelitis. Postoperative posterior fixation of the thoracic and upper lumbar spine with an apparent compression deformity at T12. IMPRESSION: Bile duct dilatation is likely physiologic post cholecystectomy. No obstructing lesion is identified. Inferior vena caval filter is in place. No acute process demonstrated in the abdomen or pelvis. Residual contrast material in the esophagus may indicate reflux or dysmotility. 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 Electronically Signed   By: Burman NievesWilliam  Stevens M.D.   On: 01/28/2015 00:44   Dg Abd Acute W/chest  01/27/2015  CLINICAL DATA:  Upper abdominal pain. Epigastric pain. Multiple doses of Excedrin over the past week due to the toothache. EXAM: DG ABDOMEN ACUTE W/ 1V CHEST COMPARISON:  Chest 08/03/2012 FINDINGS: Postoperative posterior fixation of the thoracolumbar spine. Surgical clips in the right upper quadrant. IVC filter. Shallow inspiration. Normal heart size and pulmonary vascularity. No focal airspace disease or  consolidation in the lungs. No blunting of costophrenic angles. No pneumothorax. Mediastinal contours appear intact. Scattered gas and stool in the colon. No small or large bowel distention. No free intra-abdominal air. No abnormal air-fluid levels. No radiopaque stones. IMPRESSION: No evidence of active pulmonary disease. Nonobstructive bowel gas pattern. Electronically Signed   By: Burman NievesWilliam  Stevens M.D.   On: 01/27/2015 22:38      Medications:   Impression:  Active Problems:   Paraplegia (HCC)   Decubitus ulcer   Abdominal pain     Plan:continue IV vancomycin level general surgical consult for pressure ulcer of gluteal/perineal region and continue to assess possible osteomyelitis as reported CT scan  Consultants: Surgery requested   Procedures   Antibiotics: vancomycin                  Code Status:full  Family Communication:   Disposition Plan C plan above  Time spent:30 minutes   LOS: 0 days   Vada Swift M   01/28/2015, 1:19 PM

## 2015-01-28 NOTE — Progress Notes (Signed)
Inpatient Diabetes Program Recommendations  AACE/ADA: New Consensus Statement on Inpatient Glycemic Control (2015)  Target Ranges:  Prepandial:   less than 140 mg/dL      Peak postprandial:   less than 180 mg/dL (1-2 hours)      Critically ill patients:  140 - 180 mg/dL  Results for [name], [name] (MRN [id number]) as of 01/28/2015 09:01  Ref. Range 01/27/2015 21:21  Glucose Latest Ref Range: 65-99 mg/dL 782216 (H)   Review of Glycemic Control  Diabetes history: No Outpatient Diabetes medications: NA Current orders for Inpatient glycemic control: None  Inpatient Diabetes Program Recommendations: Correction (SSI): Noted lab glucose 216 mg/dl on 95/62/13$[id number]$01/27/15@21 :21. While inpatient, please consider ordering CBGs with Novolog correction scale. HgbA1C: Please consider ordering an A1C to evaluate glycemic control over the past 2-3 months.  Thanks, Orlando PennerMarie Itali Mckendry, RN, MSN, CDE Diabetes Coordinator Inpatient Diabetes Program [phone] (Team Pager from 8am to 5pm) [phone] ([facility] office) [phone] [facility] office) [phone] [facility] office)

## 2015-01-28 NOTE — ED Notes (Signed)
Report given to floor,  

## 2015-01-28 NOTE — Progress Notes (Addendum)
ANTIBIOTIC CONSULT NOTE-Preliminary  Pharmacy Consult for vancomycin Indication: osteomyelitis  No Known Allergies  Patient Measurements: Height: 5\' 9"  (175.3 cm) Weight: 144 lb 2.9 oz (65.4 kg) IBW/kg (Calculated) : 70.7  Vital Signs: Temp: 97.5 F (36.4 C) (11/22 0609) Temp Source: Oral (11/22 0454) BP: 127/83 mmHg (11/22 0609) Pulse Rate: 110 (11/22 0609)  Labs:  Recent Labs  01/27/15 2121  WBC 11.8*  HGB 13.9  PLT 275  CREATININE 0.64    Estimated Creatinine Clearance: 97.6 mL/min (by C-G formula based on Cr of 0.64).  No results for input(s): VANCOTROUGH, VANCOPEAK, VANCORANDOM, GENTTROUGH, GENTPEAK, GENTRANDOM, TOBRATROUGH, TOBRAPEAK, TOBRARND, AMIKACINPEAK, AMIKACINTROU, AMIKACIN in the last 72 hours.   Microbiology: No results found for this or any previous visit (from the past 720 hour(s)).  Medical History: Past Medical History  Diagnosis Date  . Paraparesis (HCC)     parapalegic   . Depression   . Anxiety   . Hepatitis C   . MRSA infection   . Anemia     Patient states that he was recently told this  . Paralysis (HCC)     Medications:  Scheduled:  . enoxaparin (LOVENOX) injection  40 mg Subcutaneous Q24H  . methadone  20 mg Oral 3 times per day  . pregabalin  75 mg Oral TID    Assessment: 54 yo presented c/o abdominal pain. CT of pelvis suggests chronic osteomyelitis. Starting vancomycin empirically.   Goal of Therapy:  Vancomycin trough level 15-20 mcg/ml  Plan:  Preliminary review of pertinent patient information completed.  Protocol will be initiated with a one-time dose(s) of vancomycin 1000 mg.  Jeani HawkingAnnie Penn clinical pharmacist will complete review during morning rounds to assess patient and finalize treatment regimen.  [place]kiff, Berneice Heinrichiffany Scarlett, RPH 01/28/2015,6:20 AM   Addum:  Will continue vancomycin 750 mg IV q8 hours.  F/u cultures, renal function and clinical course.  Check vanc trough when appropriate. Talbert CageLora Biddie Sebek, PharmD

## 2015-01-28 NOTE — ED Notes (Signed)
Dr Lama at bedside,  

## 2015-01-28 NOTE — ED Notes (Signed)
Pt will arouse when spoken to, is still drowsy,

## 2015-01-28 NOTE — ED Provider Notes (Signed)
Patient has persistent pain despite multiple doses of medication. LFTs are abnormal. CT demonstrates biliary ductal dilatation. Also evidence of right-sided chronic osteomyelitis. Discussed with Dr. Sharl MaLama. Will admit to MedSurg bed.  Loren Raceravid Bartlett Enke, MD 01/28/15 [phone]

## 2015-01-28 NOTE — ED Notes (Signed)
Assisted pt with urinal, pt depends wet, changed per request,

## 2015-01-28 NOTE — H&P (Signed)
PCP:   Isabella Stalling, MD   Chief Complaint:  Abdominal pain  HPI:  54 year old male who  has a past medical history of Paraparesis (HCC); Depression; Anxiety; Hepatitis C; MRSA infection; Anemia; and Paralysis (HCC). patient came to the ED with complaints of abdominal pain he also complained of chills with lower back pain as per the ED notes. Patient received Dilaudid and is somnolent at this time unable to provide any significant history. CT scan of the abdomen and pelvis was done which showed bile duct dilation status post cholecystectomy no acute process. 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  Allergies:  No Known Allergies    Past Medical History  Diagnosis Date  . Paraparesis (HCC)     parapalegic   . Depression   . Anxiety   . Hepatitis C   . MRSA infection   . Anemia     Patient states that he was recently told this  . Paralysis [facility])     Past Surgical History  Procedure Laterality Date  . Back surgery    . Gallbladder surgery  2010  . Femur fracture surgery  March 2012    retrograde intramedullary femoral nail  . Hernia repair    . Cholecystectomy    . Orif femur fracture Right 08/04/2012    Procedure: OPEN REDUCTION INTERNAL FIXATION (ORIF) DISTAL FEMUR FRACTURE;  Surgeon: Eldred Manges, MD;  Location: [facility] OR;  Service: Orthopedics;  Laterality: Right;  Right Retrograde Supracondylar Nail Right Femur    Prior to Admission medications   Medication Sig Start Date End Date Taking? Authorizing Provider  amoxicillin (AMOXIL) 500 MG capsule Take 500 mg by mouth 3 (three) times daily. 10 day course starting 01/23/15 01/23/15  Yes Historical Provider, MD  aspirin-acetaminophen-caffeine (EXCEDRIN MIGRAINE) [phone] MG tablet Take 2 tablets by mouth 4 (four) times daily as needed for headache.   Yes Historical Provider, MD  methadone  (DOLOPHINE) 10 MG tablet Take 20 mg by mouth 3 (three) times daily. For pain   Yes Historical Provider, MD  pregabalin (LYRICA) 75 MG capsule Take 75 mg by mouth 3 (three) times daily.    Yes Historical Provider, MD  traMADol (ULTRAM) 50 MG tablet Take 50 mg by mouth 3 (three) times daily as needed for moderate pain.  01/09/15  Yes Historical Provider, MD    Social History:  reports that he quit smoking about 4 years ago. His smoking use included Cigarettes. He has a 5 pack-year smoking history. He has never used smokeless tobacco. He reports that he does not drink alcohol or use illicit drugs.  Family History  Problem Relation Age of Onset  . Cancer      family history   . Ovarian cancer Mother   . Healthy Sister   . Hodgkin's lymphoma Sister   . Heart disease Sister   . Healthy Sister   . Healthy Son   . Healthy Daughter   . Anemia Daughter   . Thyroid disease Daughter      All the positives are listed in BOLD  Review of Systems:  Unable to obtain as patient is somnolent  Physical Exam: Blood pressure 128/79, pulse 114, temperature 97.8 F (36.6 C), temperature source Oral, resp. rate 20, SpO2 95 %. Constitutional:   Patient is a well-developed and well-nourished male in no acute distress and cooperative with exam. Head: Normocephalic and atraumatic Mouth:  Mucus membranes moist Eyes: PERRL, EOMI, conjunctivae normal Neck: Supple, No Thyromegaly Cardiovascular: RRR, S1 normal, S2 normal Pulmonary/Chest: CTAB, no wheezes, rales, or rhonchi Abdominal: Soft. Mild epigastric tenderness to palpation, non-distended, bowel sounds are normal, no masses, organomegaly, or guarding present.  Neurological: Somnolent, rest of neuro exam could not be completed. Extremities : No Cyanosis, Clubbing or Edema Back- chronic wound noted in the right buttock, tenderness noted in the right buttock on palpation, no erythema  Labs on Admission:  Basic Metabolic Panel:  Recent Labs Lab  01/27/15 2121  NA 136  K 3.6  CL 101  CO2 26  GLUCOSE 216*  BUN 9  CREATININE 0.64  CALCIUM 8.9   Liver Function Tests:  Recent Labs Lab 01/27/15 2121  AST 334*  ALT 215*  ALKPHOS 269*  BILITOT 1.4*  PROT 8.0  ALBUMIN 3.8    Recent Labs Lab 01/27/15 2121  LIPASE 21   No results for input(s): AMMONIA in the last 168 hours. CBC:  Recent Labs Lab 01/27/15 2121  WBC 11.8*  NEUTROABS 10.7*  HGB 13.9  HCT 41.2  MCV 84.8  PLT 275    Radiological Exams on Admission: Ct Abdomen Pelvis W Contrast  01/28/2015  CLINICAL DATA:  Upper abdominal pain, onset a few hours ago. Chills and low back pain history of paralysis and uses a catheter to void urine. EXAM: CT ABDOMEN AND PELVIS WITH CONTRAST TECHNIQUE: Multidetector CT imaging of the abdomen and pelvis was performed using the standard protocol following bolus administration of intravenous contrast. CONTRAST:  75mL OMNIPAQUE IOHEXOL 300 MG/ML  SOLN COMPARISON:  CT pelvis 08/18/2008 FINDINGS: Atelectasis in the lung bases. Residual contrast material in the esophagus without significant dilatation suggesting reflux or dysmotility. Surgical absence of the gallbladder. Mild intra and extrahepatic bile duct dilatation. This may be physiologic post cholecystectomy. No obstructing stone or mass is identified. The pancreas, spleen, adrenal glands, kidneys, and retroperitoneal lymph nodes are unremarkable. Calcification of abdominal aorta without aneurysm. Inferior vena caval filter. Stomach is distended with food. Small bowel are decompressed. Stool-filled colon without abnormal distention. No free air or free fluid in the abdomen. Abdominal wall musculature appears intact. Pelvis: Appendix is not identified. Bladder wall is not thickened. No free or loculated pelvic fluid collections. No pelvic mass or lymphadenopathy. There is fatty atrophy of the hip muscles bilaterally. There is infiltration and skin thickening in the right posterior  perineal region consistent with a fistula. This extends to the inferior pubic ramus with evidence of bone remodeling suggesting chronic osteomyelitis. Postoperative posterior fixation of the thoracic and upper lumbar spine with an apparent compression deformity at T12. IMPRESSION: Bile duct dilatation is likely physiologic post cholecystectomy. No obstructing lesion is identified. Inferior vena caval filter is in place. No acute process demonstrated in the abdomen or pelvis. Residual contrast material in the esophagus may indicate reflux or dysmotility. 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 Electronically Signed   By: Burman NievesWilliam  Stevens M.D.   On: 01/28/2015 00:44   Dg Abd Acute W/chest  01/27/2015  CLINICAL DATA:  Upper abdominal pain. Epigastric pain. Multiple doses of Excedrin over the past week due to the toothache. EXAM: DG ABDOMEN ACUTE W/ 1V CHEST COMPARISON:  Chest 08/03/2012 FINDINGS: Postoperative posterior fixation of the thoracolumbar spine. Surgical clips in the right upper quadrant. IVC filter. Shallow inspiration. Normal heart size and pulmonary vascularity. No focal airspace disease or consolidation  in the lungs. No blunting of costophrenic angles. No pneumothorax. Mediastinal contours appear intact. Scattered gas and stool in the colon. No small or large bowel distention. No free intra-abdominal air. No abnormal air-fluid levels. No radiopaque stones. IMPRESSION: No evidence of active pulmonary disease. Nonobstructive bowel gas pattern. Electronically Signed   By: Burman Nieves M.D.   On: 01/27/2015 22:38       Assessment/Plan Active Problems:   Abdominal pain   chronic osteomyelitis  Elevated liver enzymes  Abdominal pain Patient came with abdominal pain, CT scan abdomen showed no significant abnormality. Liver enzymes are elevated. Will obtain abdominal  ultrasound  ? Chronic osteomyelitis CT scan pelvis shows findings suggestive of chronic osteomyelitis. We'll start the patient empirically on vancomycin.  Chronic pain syndrome Patient takes methadone 20 mg 3 times a day at home. Will continue home dose of methadone, also start morphine 2 mg every 4 hours when necessary for breakthrough pain.  DVT prophylaxis Lovenox  Code status: Full code  Family discussion: No family at bedside   Time Spent on Admission:   [facility] Pager: [phone] 01/28/2015, 5:20 AM  If 7PM-7AM, please contact night-coverage  [email or web address]  Password [email or web address]

## 2015-01-29 ENCOUNTER — Encounter (HOSPITAL_COMMUNITY): Payer: Self-pay

## 2015-01-29 ENCOUNTER — Inpatient Hospital Stay (HOSPITAL_COMMUNITY): Payer: Medicare Other

## 2015-01-29 LAB — COMPREHENSIVE METABOLIC PANEL
ALBUMIN: 3 g/dL — AB (ref 3.5–5.0)
ALT: 131 U/L — ABNORMAL HIGH (ref 17–63)
AST: 61 U/L — AB (ref 15–41)
Alkaline Phosphatase: 212 U/L — ABNORMAL HIGH (ref 38–126)
Anion gap: 7 (ref 5–15)
BUN: 10 mg/dL (ref 6–20)
CHLORIDE: 101 mmol/L (ref 101–111)
CO2: 27 mmol/L (ref 22–32)
Calcium: 8.4 mg/dL — ABNORMAL LOW (ref 8.9–10.3)
Creatinine, Ser: 0.68 mg/dL (ref 0.61–1.24)
GFR calc Af Amer: >60 mL/min (ref >60)
GFR calc non Af Amer: >60 mL/min (ref >60)
GLUCOSE: 107 mg/dL — AB (ref 65–99)
POTASSIUM: 2.8 mmol/L — AB (ref 3.5–5.1)
Sodium: 135 mmol/L (ref 135–145)
Total Bilirubin: 2.5 mg/dL — ABNORMAL HIGH (ref 0.3–1.2)
Total Protein: 6.8 g/dL (ref 6.5–8.1)

## 2015-01-29 LAB — CBC
HEMATOCRIT: 37.4 % — AB (ref 39.0–52.0)
Hemoglobin: 12.6 g/dL — ABNORMAL LOW (ref 13.0–17.0)
MCH: 28.4 pg (ref 26.0–34.0)
MCHC: 33.7 g/dL (ref 30.0–36.0)
MCV: 84.4 fL (ref 78.0–100.0)
PLATELETS: 198 10*3/uL (ref 150–400)
RBC: 4.43 MIL/uL (ref 4.22–5.81)
RDW: 14.6 % (ref 11.5–15.5)
WBC: 8.6 10*3/uL (ref 4.0–10.5)

## 2015-01-29 MED ORDER — VANCOMYCIN HCL IN DEXTROSE 750-5 MG/150ML-% IV SOLN: 1500.0000 mg | Freq: Two times a day (BID) | INTRAVENOUS | Status: AC

## 2015-01-29 MED ORDER — SODIUM CHLORIDE 0.9 % IJ SOLN: 10.0000 mL | INTRAMUSCULAR | Status: DC | PRN

## 2015-01-29 MED ORDER — POTASSIUM CHLORIDE 20 MEQ PO PACK
40.0000 meq | PACK | Freq: Once | ORAL | Status: AC
  Administered 2015-01-29: 40 meq via ORAL
  Filled 2015-01-29: qty 2

## 2015-01-29 MED ORDER — SODIUM CHLORIDE 0.9 % IJ SOLN
10.0000 mL | Freq: Two times a day (BID) | INTRAMUSCULAR | Status: DC
  Administered 2015-01-29: 10 mL

## 2015-01-29 NOTE — Care Management Important Message (Signed)
Important Message  Patient Details  Name: [name] MRN: [id number] Date of Birth: 1960-08-28   Medicare Important Message Given:  N/A - LOS <3 / Initial given by admissions    Cheryl FlashBlackwell, Maeola Mchaney Crowder, RN 01/29/2015, 12:51 PM

## 2015-01-29 NOTE — Care Management Note (Signed)
Case Management Note  Patient Details  Name: Keith BrilliantDerek Rice MRN: [id number] Date of Birth: [birth year]  Subjective/Objective:                    Action/Plan:   Expected Discharge Date:                  Expected Discharge Plan:  Home w Home Health Services  In-House Referral:  NA  Discharge planning Services  CM Consult  Post Acute Care Choice:  Home Health Choice offered to:  Patient  DME Arranged:    DME Agency:     HH Arranged:  RN, IV Antibiotics HH Agency:  [facility]  Status of Service:  Completed, signed off  Medicare Important Message Given:    Date Medicare IM Given:    Medicare IM give by:    Date Additional Medicare IM Given:    Additional Medicare Important Message give by:     If discussed at Long Length of Stay Meetings, dates discussed:    Additional Comments: Pt requires IV AB for 6 weeks. Pharmacy dosed home IV vanc 1500 mg BID for 6 weeks. Pt would like [facility] for IV AB as he has used them in the past. Al[facility] and will collect the pts information from the chart. HH services will start this pm for evening dose of AB. Pt will have PICC line placed prior to discharge. Pt and pts nurse aware of discharge arrangements. Arlyss QueenBlackwell, Kenwood Rosiak Lawrencevillerowder, RN 01/29/2015, 12:47 PM

## 2015-01-29 NOTE — Discharge Summary (Signed)
Physician Discharge Summary  [name] ZOX:[id number] DOB: 11-08-1960 DOA: 01/27/2015  PCP: Isabella Stalling, MD  Admit date: 01/27/2015 Discharge date: 01/29/2015   Recommendations for Outpatient Follow-up:  Patient with home healthcare with vancomycin 1500 mg IV twice a day for 6 weeks duration for presumed osteomyelitis of the pubic ram I he will then require revision of decubiti by plastic surgery with possible orthopedic surgery standby in [place] Discharge Diagnoses: osteomyelitis  Active Problems:   Paraplegia (HCC)   Decubitus ulcer   Abdominal pain   Discharge Condition: Good and improving  Filed Weights   01/28/15 0609  Weight: 144 lb 2.9 oz (65.4 kg)    History of present illness:  Patient is a known paraplegic with severe chronic right perineal decubiti which treated wound symptoms over the years off-and-on admitted with nondescript abdominal pain he status post cholecystectomy CT scan of abdomen was essentially normal as well as abdominal sonogram his abdominal pain subsided mysteriously without finding any etiology his CAT scan of abdomen and perineum revealed a fistula and a perineum with perineal decubiti and possible osteomyelitis of posterior pubic ram I's in consultation by surgery and given IV vancomycin 1500 twice a day for 6 weeks duration he'll follow-up in plastic surgery and orthopedics he'll follow-up in my office in one week's time he was given 40 mg of KCl prior to discharge for potassium of 2.8  Hospital Course:  See history of present illness  Procedures: PICC line    Consultations:  General surgery  Discharge Instructions  Discharge Instructions    Discharge instructions    Complete by:  As directed      Discharge patient    Complete by:  As directed             Medication List    STOP taking these medications        amoxicillin 500 MG capsule  Commonly known as:  AMOXIL     aspirin-acetaminophen-caffeine  250-250-65 MG tablet  Commonly known as:  EXCEDRIN MIGRAINE      TAKE these medications        methadone 10 MG tablet  Commonly known as:  DOLOPHINE  Take 20 mg by mouth 3 (three) times daily. For pain     pregabalin 75 MG capsule  Commonly known as:  LYRICA  Take 75 mg by mouth 3 (three) times daily.     Vancomycin 750 MG/150ML Soln  Commonly known as:  VANCOCIN  Inject 300 mLs (1,500 mg total) into the vein every 12 (twelve) hours.      ASK your doctor about these medications        traMADol 50 MG tablet  Commonly known as:  ULTRAM  Take 50 mg by mouth 3 (three) times daily as needed for moderate pain.       No Known Allergies     Follow-up Information    Schedule an appointment as soon as possible for a visit with Malissa Hippo, MD.   Specialty:  Gastroenterology   Contact information:   [address], [address] [place] [address] [phone]       Follow up with [facility].   Contact information:   [address] [place] [address] [phone]        The results of significant diagnostics from this hospitalization (including imaging, microbiology, ancillary and laboratory) are listed below for reference.    Significant Diagnostic Studies: US Abdomen Complete  01/28/2015  CLINICAL DATA:  Hepatitis-C.  Chronic abdominal pain. EXAM: ULTRASOUND ABDOMEN COMPLETE COMPARISON:  CT 01/28/2015 FINDINGS: Gallbladder: Prior cholecystectomy Common bile duct: Diameter: 7 mm in diameter where visualized. The distal duct is obscured by bowel gas. No visible stones Liver: No focal lesion identified. Within normal limits in parenchymal echogenicity. Mild intrahepatic ductal dilatation. IVC: No abnormality visualized. Pancreas: Visualized portion unremarkable. Spleen: Size and appearance within normal limits. Right Kidney: Length: 12.1 cm. Echogenicity within normal limits. No mass or hydronephrosis visualized. Left Kidney: Length: 12.6 cm.  Echogenicity within normal limits. No mass or hydronephrosis visualized. Abdominal aorta: No aneurysm visualized. Other findings: None. IMPRESSION: Mild intrahepatic biliary ductal dilatation. The visualized proximal common bile duct is upper limits normal in caliber at 7 mm. Findings could be related to post cholecystectomy state. If there is clinical concern and further evaluation is felt warranted, MRCP may be beneficial. Electronically Signed   By: Charlett NoseKevin  Dover M.D.   On: 01/28/2015 10:17   Ct Abdomen Pelvis W Contrast  01/28/2015  CLINICAL DATA:  Upper abdominal pain, onset a few hours ago. Chills and low back pain history of paralysis and uses a catheter to void urine. EXAM: CT ABDOMEN AND PELVIS WITH CONTRAST TECHNIQUE: Multidetector CT imaging of the abdomen and pelvis was performed using the standard protocol following bolus administration of intravenous contrast. CONTRAST:  75mL OMNIPAQUE IOHEXOL 300 MG/ML  SOLN COMPARISON:  CT pelvis 08/18/2008 FINDINGS: Atelectasis in the lung bases. Residual contrast material in the esophagus without significant dilatation suggesting reflux or dysmotility. Surgical absence of the gallbladder. Mild intra and extrahepatic bile duct dilatation. This may be physiologic post cholecystectomy. No obstructing stone or mass is identified. The pancreas, spleen, adrenal glands, kidneys, and retroperitoneal lymph nodes are unremarkable. Calcification of abdominal aorta without aneurysm. Inferior vena caval filter. Stomach is distended with food. Small bowel are decompressed. Stool-filled colon without abnormal distention. No free air or free fluid in the abdomen. Abdominal wall musculature appears intact. Pelvis: Appendix is not identified. Bladder wall is not thickened. No free or loculated pelvic fluid collections. No pelvic mass or lymphadenopathy. There is fatty atrophy of the hip muscles bilaterally. There is infiltration and skin thickening in the right posterior perineal  region consistent with a fistula. This extends to the inferior pubic ramus with evidence of bone remodeling suggesting chronic osteomyelitis. Postoperative posterior fixation of the thoracic and upper lumbar spine with an apparent compression deformity at T12. IMPRESSION: Bile duct dilatation is likely physiologic post cholecystectomy. No obstructing lesion is identified. Inferior vena caval filter is in place. No acute process demonstrated in the abdomen or pelvis. Residual contrast material in the esophagus may indicate reflux or dysmotility. 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 Electronically Signed   By: Burman NievesWilliam  Stevens M.D.   On: 01/28/2015 00:44   Dg Abd Acute W/chest  01/27/2015  CLINICAL DATA:  Upper abdominal pain. Epigastric pain. Multiple doses of Excedrin over the past week due to the toothache. EXAM: DG ABDOMEN ACUTE W/ 1V CHEST COMPARISON:  Chest 08/03/2012 FINDINGS: Postoperative posterior fixation of the thoracolumbar spine. Surgical clips in the right upper quadrant. IVC filter. Shallow inspiration. Normal heart size and pulmonary vascularity. No focal airspace disease or consolidation in the lungs. No blunting of costophrenic angles. No pneumothorax. Mediastinal contours appear intact. Scattered gas and stool in the colon. No small or large bowel distention. No free intra-abdominal air. No abnormal air-fluid levels. No radiopaque  stones. IMPRESSION: No evidence of active pulmonary disease. Nonobstructive bowel gas pattern. Electronically Signed   By: Burman Nieves M.D.   On: 01/27/2015 22:38    Microbiology: Recent Results (from the past 240 hour(s))  MRSA PCR Screening     Status: None   Collection Time: 01/28/15  6:50 AM  Result Value Ref Range Status   MRSA by PCR NEGATIVE NEGATIVE Final    Comment:        The GeneXpert MRSA Assay (FDA approved for  NASAL specimens only), is one component of a comprehensive MRSA colonization surveillance program. It is not intended to diagnose MRSA infection nor to guide or monitor treatment for MRSA infections.   C difficile quick scan w PCR reflex     Status: None   Collection Time: 01/28/15  8:12 PM  Result Value Ref Range Status   C Diff antigen NEGATIVE NEGATIVE Final   C Diff toxin NEGATIVE NEGATIVE Final   C Diff interpretation Negative for toxigenic C. difficile  Final     Labs: Basic Metabolic Panel:  Recent Labs Lab 01/27/15 2121 01/28/15 0646 01/29/15 0628  NA 136  --  135  K 3.6  --  2.8*  CL 101  --  101  CO2 26  --  27  GLUCOSE 216*  --  107*  BUN 9  --  10  CREATININE 0.64 0.71 0.68  CALCIUM 8.9  --  8.4*   Liver Function Tests:  Recent Labs Lab 01/27/15 2121 01/29/15 0628  AST 334* 61*  ALT 215* 131*  ALKPHOS 269* 212*  BILITOT 1.4* 2.5*  PROT 8.0 6.8  ALBUMIN 3.8 3.0*    Recent Labs Lab 01/27/15 2121  LIPASE 21   No results for input(s): AMMONIA in the last 168 hours. CBC:  Recent Labs Lab 01/27/15 2121 01/28/15 0646 01/29/15 0628  WBC 11.8* 18.3* 8.6  NEUTROABS 10.7*  --   --   HGB 13.9 13.9 12.6*  HCT 41.2 41.2 37.4*  MCV 84.8 84.3 84.4  PLT 275 230 198   Cardiac Enzymes: No results for input(s): CKTOTAL, CKMB, CKMBINDEX, TROPONINI in the last 168 hours. BNP: BNP (last 3 results) No results for input(s): BNP in the last 8760 hours.  ProBNP (last 3 results) No results for input(s): PROBNP in the last 8760 hours.  CBG: No results for input(s): GLUCAP in the last 168 hours.     Signed:  Suann Klier Judie Petit  [facility] Pager: [phone] 01/29/2015, 12:35 PM

## 2015-01-29 NOTE — Progress Notes (Signed)
Peripherally Inserted Central Catheter/Midline Placement  The IV Nurse has discussed with the patient and/or persons authorized to consent for the patient, the purpose of this procedure and the potential benefits and risks involved with this procedure.  The benefits include less needle sticks, lab draws from the catheter and patient may be discharged home with the catheter.  Risks include, but not limited to, infection, bleeding, blood clot (thrombus formation), and puncture of an artery; nerve damage and irregular heat beat.  Alternatives to this procedure were also discussed.  PICC/Midline Placement Documentation  PICC / Midline Single Lumen 01/29/15 PICC Right Basilic 38 cm 0 cm (Active)  Indication for Insertion or Continuance of Line Home intravenous therapies (PICC only);Prolonged intravenous therapies 01/29/2015  2:33 PM  Exposed Catheter (cm) 0 cm 01/29/2015  2:33 PM  Site Assessment Clean;Dry;Intact 01/29/2015  2:33 PM  Line Status Flushed;Blood return noted;Capped (central line) 01/29/2015  2:33 PM  Dressing Type Transparent 01/29/2015  2:33 PM  Dressing Status Clean;Dry;Intact;Antimicrobial disc in place 01/29/2015  2:33 PM  Line Care Connections checked and tightened 01/29/2015  2:33 PM  Line Adjustment (NICU/IV Team Only) No 01/29/2015  2:33 PM  Dressing Intervention New dressing 01/29/2015  2:33 PM  Dressing Change Due 02/05/15 01/29/2015  2:33 PM       Myra RudeWitherspoon, Kaydynce Pat S 01/29/2015, 2:37 PM

## 2015-01-29 NOTE — Progress Notes (Signed)
Discharge instructions given on medications,and follow up appointments,patient verbalized understanding. [facility] to follow up with patient. Accompanied by staff to an awaiting vehicle.

## 2015-01-29 NOTE — Consult Note (Signed)
Reason for Consult: Chronic perineal ulceration with fistulous track Referring Physician: Dr. Lorriane Shire  [name] is an 54 y.o. male.  HPI: Patient is a 54 year old paraplegic white male who states he has had a perineal wound present in the right buttock region for many years. He was seen at [facility] and was followed for several years. He feels that he did not gain any benefit from that. He has been on IV vancomycin at home in the past for MRSA infection. He presented to [facility] for abdominal pain which has since resolved. His leukocytosis has resolved and he would like to go home.  Past Medical History  Diagnosis Date  . 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 Depression   . Anxiety   . Hepatitis C   . MRSA infection   . Anemia     Patient states that he was recently told this  . Paralysis [facility])     Past Surgical History  Procedure Laterality Date  . Back surgery    . Gallbladder surgery  2010  . Femur fracture surgery  March 2012    retrograde intramedullary femoral nail  . Hernia repair    . Cholecystectomy    . Orif femur fracture Right 08/04/2012    Procedure: OPEN REDUCTION INTERNAL FIXATION (ORIF) DISTAL FEMUR FRACTURE;  Surgeon: Marybelle Killings, MD;  Location: [place];  Service: Orthopedics;  Laterality: Right;  Right Retrograde Supracondylar Nail Right Femur    Family History  Problem Relation Age of Onset  . Cancer      family history   . Ovarian cancer Mother   . Healthy Sister   . Hodgkin's lymphoma Sister   . Heart disease Sister   . Healthy Sister   . Healthy Son   . Healthy Daughter   . Anemia Daughter   . Thyroid disease Daughter     Social History:  reports that he quit smoking about 4 years ago. His smoking use included Cigarettes. He has a 5 pack-year smoking history. He has never used smokeless tobacco. He reports that he does not drink alcohol or use illicit drugs.  Allergies: No Known Allergies  Medications: I have reviewed the patient's  current medications.  Results for orders placed or performed during the hospital encounter of 01/27/15 (from the past 48 hour(s))  CBC with Differential     Status: Abnormal   Collection Time: 01/27/15  9:21 PM  Result Value Ref Range   WBC 11.8 (H) 4.0 - 10.5 K/uL   RBC 4.86 4.22 - 5.81 MIL/uL   Hemoglobin 13.9 13.0 - 17.0 g/dL   HCT 41.2 39.0 - 52.0 %   MCV 84.8 78.0 - 100.0 fL   MCH 28.6 26.0 - 34.0 pg   MCHC 33.7 30.0 - 36.0 g/dL   RDW 14.1 11.5 - 15.5 %   Platelets 275 150 - 400 K/uL   Neutrophils Relative % 90 %   Neutro Abs 10.7 (H) 1.7 - 7.7 K/uL   Lymphocytes Relative 7 %   Lymphs Abs 0.8 0.7 - 4.0 K/uL   Monocytes Relative 3 %   Monocytes Absolute 0.3 0.1 - 1.0 K/uL   Eosinophils Relative 0 %   Eosinophils Absolute 0.0 0.0 - 0.7 K/uL   Basophils Relative 0 %   Basophils Absolute 0.0 0.0 - 0.1 K/uL  Comprehensive metabolic panel     Status: Abnormal   Collection Time: 01/27/15  9:21 PM  Result Value Ref Range  Sodium 136 135 - 145 mmol/L   Potassium 3.6 3.5 - 5.1 mmol/L   Chloride 101 101 - 111 mmol/L   CO2 26 22 - 32 mmol/L   Glucose, Bld 216 (H) 65 - 99 mg/dL   BUN 9 6 - 20 mg/dL   Creatinine, Ser 0.64 0.61 - 1.24 mg/dL   Calcium 8.9 8.9 - 10.3 mg/dL   Total Protein 8.0 6.5 - 8.1 g/dL   Albumin 3.8 3.5 - 5.0 g/dL   AST 334 (H) 15 - 41 U/L   ALT 215 (H) 17 - 63 U/L   Alkaline Phosphatase 269 (H) 38 - 126 U/L   Total Bilirubin 1.4 (H) 0.3 - 1.2 mg/dL   GFR calc non Af Amer >60 >60 mL/min   GFR calc Af Amer >60 >60 mL/min    Comment: (NOTE) The eGFR has been calculated using the CKD EPI equation. This calculation has not been validated in all clinical situations. eGFR's persistently <60 mL/min signify possible Chronic Kidney Disease.    Anion gap 9 5 - 15  Lipase, blood     Status: None   Collection Time: 01/27/15  9:21 PM  Result Value Ref Range   Lipase 21 11 - 51 U/L  Lactic acid, plasma     Status: None   Collection Time: 01/27/15  9:21 PM  Result  Value Ref Range   Lactic Acid, Venous 1.4 0.5 - 2.0 mmol/L  CBC     Status: Abnormal   Collection Time: 01/28/15  6:46 AM  Result Value Ref Range   WBC 18.3 (H) 4.0 - 10.5 K/uL   RBC 4.89 4.22 - 5.81 MIL/uL   Hemoglobin 13.9 13.0 - 17.0 g/dL   HCT 41.2 39.0 - 52.0 %   MCV 84.3 78.0 - 100.0 fL   MCH 28.4 26.0 - 34.0 pg   MCHC 33.7 30.0 - 36.0 g/dL   RDW 14.2 11.5 - 15.5 %   Platelets 230 150 - 400 K/uL  Creatinine, serum     Status: None   Collection Time: 01/28/15  6:46 AM  Result Value Ref Range   Creatinine, Ser 0.71 0.61 - 1.24 mg/dL   GFR calc non Af Amer >60 >60 mL/min   GFR calc Af Amer >60 >60 mL/min    Comment: (NOTE) The eGFR has been calculated using the CKD EPI equation. This calculation has not been validated in all clinical situations. eGFR's persistently <60 mL/min signify possible Chronic Kidney Disease.   MRSA PCR Screening     Status: None   Collection Time: 01/28/15  6:50 AM  Result Value Ref Range   MRSA by PCR NEGATIVE NEGATIVE    Comment:        The GeneXpert MRSA Assay (FDA approved for NASAL specimens only), is one component of a comprehensive MRSA colonization surveillance program. It is not intended to diagnose MRSA infection nor to guide or monitor treatment for MRSA infections.   C difficile quick scan w PCR reflex     Status: None   Collection Time: 01/28/15  8:12 PM  Result Value Ref Range   C Diff antigen NEGATIVE NEGATIVE   C Diff toxin NEGATIVE NEGATIVE   C Diff interpretation Negative for toxigenic C. difficile   CBC     Status: Abnormal   Collection Time: 01/29/15  6:28 AM  Result Value Ref Range   WBC 8.6 4.0 - 10.5 K/uL   RBC 4.43 4.22 - 5.81 MIL/uL   Hemoglobin 12.6 (L) 13.0 -  17.0 g/dL   HCT 37.4 (L) 39.0 - 52.0 %   MCV 84.4 78.0 - 100.0 fL   MCH 28.4 26.0 - 34.0 pg   MCHC 33.7 30.0 - 36.0 g/dL   RDW 14.6 11.5 - 15.5 %   Platelets 198 150 - 400 K/uL  Comprehensive metabolic panel     Status: Abnormal   Collection Time:  01/29/15  6:28 AM  Result Value Ref Range   Sodium 135 135 - 145 mmol/L   Potassium 2.8 (L) 3.5 - 5.1 mmol/L    Comment: DELTA CHECK NOTED   Chloride 101 101 - 111 mmol/L   CO2 27 22 - 32 mmol/L   Glucose, Bld 107 (H) 65 - 99 mg/dL   BUN 10 6 - 20 mg/dL   Creatinine, Ser 0.68 0.61 - 1.24 mg/dL   Calcium 8.4 (L) 8.9 - 10.3 mg/dL   Total Protein 6.8 6.5 - 8.1 g/dL   Albumin 3.0 (L) 3.5 - 5.0 g/dL   AST 61 (H) 15 - 41 U/L   ALT 131 (H) 17 - 63 U/L   Alkaline Phosphatase 212 (H) 38 - 126 U/L   Total Bilirubin 2.5 (H) 0.3 - 1.2 mg/dL   GFR calc non Af Amer >60 >60 mL/min   GFR calc Af Amer >60 >60 mL/min    Comment: (NOTE) The eGFR has been calculated using the CKD EPI equation. This calculation has not been validated in all clinical situations. eGFR's persistently <60 mL/min signify possible Chronic Kidney Disease.    Anion gap 7 5 - 15    US Abdomen Complete  01/28/2015  CLINICAL DATA:  Hepatitis-C.  Chronic abdominal pain. EXAM: ULTRASOUND ABDOMEN COMPLETE COMPARISON:  CT 01/28/2015 FINDINGS: Gallbladder: Prior cholecystectomy Common bile duct: Diameter: 7 mm in diameter where visualized. The distal duct is obscured by bowel gas. No visible stones Liver: No focal lesion identified. Within normal limits in parenchymal echogenicity. Mild intrahepatic ductal dilatation. IVC: No abnormality visualized. Pancreas: Visualized portion unremarkable. Spleen: Size and appearance within normal limits. Right Kidney: Length: 12.1 cm. Echogenicity within normal limits. No mass or hydronephrosis visualized. Left Kidney: Length: 12.6 cm. Echogenicity within normal limits. No mass or hydronephrosis visualized. Abdominal aorta: No aneurysm visualized. Other findings: None. IMPRESSION: Mild intrahepatic biliary ductal dilatation. The visualized proximal common bile duct is upper limits normal in caliber at 7 mm. Findings could be related to post cholecystectomy state. If there is clinical concern and further  evaluation is felt warranted, MRCP may be beneficial. Electronically Signed   By: Rolm Baptise M.D.   On: 01/28/2015 10:17   Ct Abdomen Pelvis W Contrast  01/28/2015  CLINICAL DATA:  Upper abdominal pain, onset a few hours ago. Chills and low back pain history of paralysis and uses a catheter to void urine. EXAM: CT ABDOMEN AND PELVIS WITH CONTRAST TECHNIQUE: Multidetector CT imaging of the abdomen and pelvis was performed using the standard protocol following bolus administration of intravenous contrast. CONTRAST:  57m OMNIPAQUE IOHEXOL 300 MG/ML  SOLN COMPARISON:  CT pelvis 08/18/2008 FINDINGS: Atelectasis in the lung bases. Residual contrast material in the esophagus without significant dilatation suggesting reflux or dysmotility. Surgical absence of the gallbladder. Mild intra and extrahepatic bile duct dilatation. This may be physiologic post cholecystectomy. No obstructing stone or mass is identified. The pancreas, spleen, adrenal glands, kidneys, and retroperitoneal lymph nodes are unremarkable. Calcification of abdominal aorta without aneurysm. Inferior vena caval filter. Stomach is distended with food. Small bowel are decompressed. Stool-filled colon without  abnormal distention. No free air or free fluid in the abdomen. Abdominal wall musculature appears intact. Pelvis: Appendix is not identified. Bladder wall is not thickened. No free or loculated pelvic fluid collections. No pelvic mass or lymphadenopathy. There is fatty atrophy of the hip muscles bilaterally. There is infiltration and skin thickening in the right posterior perineal region consistent with a fistula. This extends to the inferior pubic ramus with evidence of bone remodeling suggesting chronic osteomyelitis. Postoperative posterior fixation of the thoracic and upper lumbar spine with an apparent compression deformity at T12. IMPRESSION: Bile duct dilatation is likely physiologic post cholecystectomy. No obstructing lesion is identified.  Inferior vena caval filter is in place. No acute process demonstrated in the abdomen or pelvis. Residual contrast material in the esophagus may indicate reflux or dysmotility. 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 Electronically Signed   By: Lucienne Capers M.D.   On: 01/28/2015 00:44   Dg Abd Acute W/chest  01/27/2015  CLINICAL DATA:  Upper abdominal pain. Epigastric pain. Multiple doses of Excedrin over the past week due to the toothache. EXAM: DG ABDOMEN ACUTE W/ 1V CHEST COMPARISON:  Chest 08/03/2012 FINDINGS: Postoperative posterior fixation of the thoracolumbar spine. Surgical clips in the right upper quadrant. IVC filter. Shallow inspiration. Normal heart size and pulmonary vascularity. No focal airspace disease or consolidation in the lungs. No blunting of costophrenic angles. No pneumothorax. Mediastinal contours appear intact. Scattered gas and stool in the colon. No small or large bowel distention. No free intra-abdominal air. No abnormal air-fluid levels. No radiopaque stones. IMPRESSION: No evidence of active pulmonary disease. Nonobstructive bowel gas pattern. Electronically Signed   By: Lucienne Capers M.D.   On: 01/27/2015 22:38    ROS: See chart Blood pressure 155/62, pulse 88, temperature 98.2 F (36.8 C), temperature source Oral, resp. rate 18, height _0  (1.753 m), weight 65.4 kg (144 lb 2.9 oz), SpO2 100 %. Physical Exam: Pleasant white male in no acute distress. Right perineum with a 2-3 cm area of scar tissue with a fistulous track present. I did not probe this given the CT findings. No purulent drainage was noted from the tract. It is nontender.  Assessment/Plan: Impression: Pressure ulceration of right perineum, chronic, with fistulous tract to the posterior pubic ramus. There is evidence of bone destruction, concerning for osteomyelitis I would  suggest a 6 week course of IV vancomycin. Surgically, this would be best treated by a plastic surgeon with possible orthopedic surgery involvement. This is outside my realm of expertise. I would suggest referral to Dr. Migdalia Dk, plastic surgeon, in [place].  Jaiana Sheffer A 01/29/2015, 8:49 AM

## 2015-01-31 ENCOUNTER — Other Ambulatory Visit (HOSPITAL_COMMUNITY)
Admission: RE | Admit: 2015-01-31 | Discharge: 2015-01-31 | Disposition: A | Discharge status: Home / Self Care | Payer: Medicare Other | Source: Skilled Nursing Facility | Attending: Family Medicine | Admitting: Family Medicine

## 2015-01-31 DIAGNOSIS — L89309 Pressure ulcer of unspecified buttock, unspecified stage: Secondary | ICD-10-CM | POA: Insufficient documentation

## 2015-01-31 LAB — VANCOMYCIN, TROUGH: Vancomycin Tr: 13 ug/mL (ref 10.0–20.0)

## 2015-02-03 ENCOUNTER — Other Ambulatory Visit (HOSPITAL_COMMUNITY)
Admission: RE | Admit: 2015-02-03 | Discharge: 2015-02-03 | Disposition: A | Discharge status: Home / Self Care | Payer: Medicare Other | Source: Other Acute Inpatient Hospital | Attending: Family Medicine | Admitting: Family Medicine

## 2015-02-03 DIAGNOSIS — Z5181 Encounter for therapeutic drug level monitoring: Secondary | ICD-10-CM | POA: Insufficient documentation

## 2015-02-03 DIAGNOSIS — M8600 Acute hematogenous osteomyelitis, unspecified site: Secondary | ICD-10-CM | POA: Diagnosis present

## 2015-02-03 LAB — CBC WITH DIFFERENTIAL/PLATELET
BASOS ABS: 0 10*3/uL (ref 0.0–0.1)
BASOS PCT: 1 %
EOS ABS: 0.1 10*3/uL (ref 0.0–0.7)
Eosinophils Relative: 2 %
HCT: 37.6 % — ABNORMAL LOW (ref 39.0–52.0)
HEMOGLOBIN: 12.5 g/dL — AB (ref 13.0–17.0)
Lymphocytes Relative: 37 %
Lymphs Abs: 1.5 10*3/uL (ref 0.7–4.0)
MCH: 28 pg (ref 26.0–34.0)
MCHC: 33.2 g/dL (ref 30.0–36.0)
MCV: 84.3 fL (ref 78.0–100.0)
MONO ABS: 0.5 10*3/uL (ref 0.1–1.0)
Monocytes Relative: 13 %
NEUTROS ABS: 1.9 10*3/uL (ref 1.7–7.7)
Neutrophils Relative %: 47 %
PLATELETS: 280 10*3/uL (ref 150–400)
RBC: 4.46 MIL/uL (ref 4.22–5.81)
RDW: 14 % (ref 11.5–15.5)
WBC: 4.1 10*3/uL (ref 4.0–10.5)

## 2015-02-03 LAB — VANCOMYCIN, TROUGH: Vancomycin Tr: 16 ug/mL (ref 10.0–20.0)

## 2015-02-03 LAB — CREATININE, SERUM
CREATININE: 0.54 mg/dL — AB (ref 0.61–1.24)
GFR calc Af Amer: >60 mL/min (ref >60)

## 2015-02-03 LAB — BUN: BUN: 9 mg/dL (ref 6–20)

## 2015-02-06 ENCOUNTER — Ambulatory Visit (INDEPENDENT_AMBULATORY_CARE_PROVIDER_SITE_OTHER): Payer: Medicare Other | Admitting: Internal Medicine

## 2015-02-06 ENCOUNTER — Encounter (INDEPENDENT_AMBULATORY_CARE_PROVIDER_SITE_OTHER): Payer: Self-pay | Admitting: Internal Medicine

## 2015-02-06 VITALS — BP 116/78 | HR 72 | Temp 97.2°F | Ht 69.0 in | Wt 144.0 lb

## 2015-02-06 DIAGNOSIS — R748 Abnormal levels of other serum enzymes: Secondary | ICD-10-CM

## 2015-02-06 DIAGNOSIS — B1920 Unspecified viral hepatitis C without hepatic coma: Secondary | ICD-10-CM

## 2015-02-06 NOTE — Patient Instructions (Signed)
CBC, CMET, US abdomen. OV 6 months.

## 2015-02-06 NOTE — Progress Notes (Addendum)
Subjective:    Patient ID: [name], male    DOB: Feb 17, 1961, 54 y.o.   MRN: [id number]  HPI Recently seen in the ED for a bone infection (osteomyelitis). Presently taking Vancomycin 1538m IV daily BID. Has PICC line in rt arm.Evaluated by Dr. JArnoldo Moralebut felt this was out of his realm of expertise.    He will be on Vancomycin for 5 more weeks.Ordered by Dr. DCindie Laroche  Presents today for f/u of his Hepatitis C. Treated with Harvoni and he cleared the virus last year (2015) He tells me he had been taking Excedrin two tabs three times a day x 1 week for a toothache. . He stopped the Excedrin about a week ago. Seen in the ED 01/28/2015 for epigastric pain. [place] basically normal.  Recent liver enzymes are elevated.  He is not doing IV drugs.  No etoh.  Appetite is good. He denies any weight loss. I am unable to get a weight on him. He is w/c bound and he is a paraplegic. Weight is from last admission.  He has a BM every 3-4 days. No melena or BRRB. No abdominal pain now. Hx of ERCP with sphincterotomy  in 2010 for CBD stones.  01/28/2015 [place]abdomen: abdominal pain:  IMPRESSION: Mild intrahepatic biliary ductal dilatation. The visualized proximal common bile duct is upper limits normal in caliber at 7 mm. Findings could be related to post cholecystectomy state. If there is clinical concern and further evaluation is felt warranted, MRCP may be beneficial.   01/27/2015 CT abdomen/pelvis with CM: epigastric pain IMPRESSION: Bile duct dilatation is likely physiologic post cholecystectomy. No obstructing lesion is identified. Inferior vena caval filter is in place. No acute process demonstrated in the abdomen or pelvis. Residual contrast material in the esophagus may indicate reflux or dysmotility. 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  CBC      Component Value Date/Time   WBC 4.1 02/03/2015 0900   RBC 4.46 02/03/2015 0900   RBC 3.60* 01/31/2009 0715   HGB 12.5* 02/03/2015 0900   HCT 37.6* 02/03/2015 0900   PLT 280 02/03/2015 0900   MCV 84.3 02/03/2015 0900   MCH 28.0 02/03/2015 0900   MCHC 33.2 02/03/2015 0900   RDW 14.0 02/03/2015 0900   LYMPHSABS 1.5 02/03/2015 0900   MONOABS 0.5 02/03/2015 0900   EOSABS 0.1 02/03/2015 0900   BASOSABS 0.0 02/03/2015 0900         Hepatic Function Latest Ref Rng 01/29/2015 01/27/2015 12/25/2013  Total Protein 6.5 - 8.1 g/dL 6.8 8.0 7.6  Albumin 3.5 - 5.0 g/dL 3.0(L) 3.8 4.1  AST 15 - 41 U/L 61(H) 334(H) 16  ALT 17 - 63 U/L 131(H) 215(H) 11  Alk Phosphatase 38 - 126 U/L 212(H) 269(H) 94  Total Bilirubin 0.3 - 1.2 mg/dL 2.5(H) 1.4(H) 0.8  Bilirubin, Direct 0.0 - 0.3 mg/dL - - 0.2        CMP Latest Ref Rng 02/03/2015 01/29/2015 01/28/2015  Glucose 65 - 99 mg/dL - 107(H) -  BUN 6 - 20 mg/dL 9 10 -  Creatinine 0.61 - 1.24 mg/dL 0.54(L) 0.68 0.71  Sodium 135 - 145 mmol/L - 135 -  Potassium 3.5 - 5.1 mmol/L - 2.8(L) -  Chloride 101 - 111 mmol/L - 101 -  CO2 22 - 32 mmol/L - 27 -  Calcium 8.9 - 10.3 mg/dL - 8.4(L) -  Total Protein 6.5 -  8.1 g/dL - 6.8 -  Total Bilirubin 0.3 - 1.2 mg/dL - 2.5(H) -  Alkaline Phos 38 - 126 U/L - 212(H) -  AST 15 - 41 U/L - 61(H) -  ALT 17 - 63 U/L - 131(H) -            Review of Systems Past Medical History  Diagnosis Date  . 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 Depression   . Anxiety   . Hepatitis C   . MRSA infection   . Anemia     Patient states that he was recently told this  . Paralysis [facility])     Past Surgical History  Procedure Laterality Date  . Back surgery    . Gallbladder surgery  2010  . Femur fracture surgery  March 2012    retrograde intramedullary femoral nail  . Hernia repair    . Cholecystectomy    . Orif femur fracture Right 08/04/2012    Procedure: OPEN REDUCTION INTERNAL FIXATION (ORIF) DISTAL FEMUR  FRACTURE;  Surgeon: Marybelle Killings, MD;  Location: [place];  Service: Orthopedics;  Laterality: Right;  Right Retrograde Supracondylar Nail Right Femur    No Known Allergies  Current Outpatient Prescriptions on File Prior to Visit  Medication Sig Dispense Refill  . methadone (DOLOPHINE) 10 MG tablet Take 60 mg by mouth 3 (three) times daily. For pain    . pregabalin (LYRICA) 75 MG capsule Take 75 mg by mouth 3 (three) times daily.     . traMADol (ULTRAM) 50 MG tablet Take 50 mg by mouth 3 (three) times daily as needed for moderate pain.     . Vancomycin (VANCOCIN) 750 MG/150ML SOLN Inject 300 mLs (1,500 mg total) into the vein every 12 (twelve) hours. 4000 mL 4   No current facility-administered medications on file prior to visit.        Objective:   Physical ExamBlood pressure 116/78, pulse 72, temperature 97.2 F (36.2 C), height '5\' 9"'  (1.753 m), weight 144 lb (65.318 kg).  Alert and oriented. Skin warm and dry. Oral mucosa is moist.   . Sclera anicteric, conjunctivae is pink. Thyroid not enlarged. No cervical lymphadenopathy. Lungs clear. Heart regular rate and rhythm.  Abdomen is soft. Bowel sounds are positive. No hepatomegaly. No abdominal masses felt. No tenderness.   Patient w/c bound.        Assessment & Plan:  Hepatitis C. Am going to get a Hep C quaint.  Elevated liver enzymes? Am going to repeat a CMET on him. Had been on Excedrin which has Tylenol in it.  Hx also of CBD duct stones back in 2010. His liver enzymes were normal with active Hepatitis C.

## 2015-02-07 ENCOUNTER — Telehealth (INDEPENDENT_AMBULATORY_CARE_PROVIDER_SITE_OTHER): Payer: Self-pay | Admitting: Internal Medicine

## 2015-02-07 NOTE — Telephone Encounter (Signed)
I advised patient if pain returns we will get a stat MRCP. I discussed with Dr. Karilyn Cotaehman.

## 2015-02-09 LAB — HEPATITIS C RNA QUANTITATIVE: HCV QUANT: NOT DETECTED [IU]/mL (ref <15)

## 2015-02-10 ENCOUNTER — Other Ambulatory Visit (HOSPITAL_COMMUNITY)
Admission: RE | Admit: 2015-02-10 | Discharge: 2015-02-10 | Disposition: A | Discharge status: Home / Self Care | Payer: Medicare Other | Source: Other Acute Inpatient Hospital | Attending: Family Medicine | Admitting: Family Medicine

## 2015-02-10 DIAGNOSIS — M8530 Osteitis condensans, unspecified site: Secondary | ICD-10-CM | POA: Diagnosis present

## 2015-02-10 DIAGNOSIS — Z5181 Encounter for therapeutic drug level monitoring: Secondary | ICD-10-CM | POA: Insufficient documentation

## 2015-02-10 LAB — CBC WITH DIFFERENTIAL/PLATELET
BASOS PCT: 0 %
Basophils Absolute: 0 10*3/uL (ref 0.0–0.1)
Eosinophils Absolute: 0.2 10*3/uL (ref 0.0–0.7)
Eosinophils Relative: 4 %
HEMATOCRIT: 36.5 % — AB (ref 39.0–52.0)
Hemoglobin: 12.2 g/dL — ABNORMAL LOW (ref 13.0–17.0)
LYMPHS PCT: 31 %
Lymphs Abs: 1.4 10*3/uL (ref 0.7–4.0)
MCH: 28.2 pg (ref 26.0–34.0)
MCHC: 33.4 g/dL (ref 30.0–36.0)
MCV: 84.3 fL (ref 78.0–100.0)
MONO ABS: 0.6 10*3/uL (ref 0.1–1.0)
MONOS PCT: 13 %
NEUTROS ABS: 2.4 10*3/uL (ref 1.7–7.7)
Neutrophils Relative %: 52 %
Platelets: 346 10*3/uL (ref 150–400)
RBC: 4.33 MIL/uL (ref 4.22–5.81)
RDW: 14 % (ref 11.5–15.5)
WBC: 4.6 10*3/uL (ref 4.0–10.5)

## 2015-02-10 LAB — CREATININE, SERUM
Creatinine, Ser: 0.54 mg/dL — ABNORMAL LOW (ref 0.61–1.24)
GFR calc Af Amer: >60 mL/min (ref >60)
GFR calc non Af Amer: >60 mL/min (ref >60)

## 2015-02-10 LAB — VANCOMYCIN, TROUGH: Vancomycin Tr: 17 ug/mL (ref 10.0–20.0)

## 2015-02-10 LAB — BUN: BUN: 13 mg/dL (ref 6–20)

## 2015-02-14 ENCOUNTER — Encounter (INDEPENDENT_AMBULATORY_CARE_PROVIDER_SITE_OTHER): Payer: Self-pay | Admitting: Optometry

## 2015-02-17 ENCOUNTER — Other Ambulatory Visit (HOSPITAL_COMMUNITY)
Admission: RE | Admit: 2015-02-17 | Discharge: 2015-02-17 | Disposition: A | Discharge status: Home / Self Care | Payer: Medicare Other | Source: Other Acute Inpatient Hospital | Attending: Family Medicine | Admitting: Family Medicine

## 2015-02-17 DIAGNOSIS — M8530 Osteitis condensans, unspecified site: Secondary | ICD-10-CM | POA: Insufficient documentation

## 2015-02-17 LAB — CBC WITH DIFFERENTIAL/PLATELET
Basophils Absolute: 0 10*3/uL (ref 0.0–0.1)
Basophils Relative: 0 %
Eosinophils Absolute: 0.1 10*3/uL (ref 0.0–0.7)
Eosinophils Relative: 4 %
HEMATOCRIT: 37.3 % — AB (ref 39.0–52.0)
HEMOGLOBIN: 12.6 g/dL — AB (ref 13.0–17.0)
LYMPHS ABS: 1.4 10*3/uL (ref 0.7–4.0)
LYMPHS PCT: 41 %
MCH: 28.1 pg (ref 26.0–34.0)
MCHC: 33.8 g/dL (ref 30.0–36.0)
MCV: 83.1 fL (ref 78.0–100.0)
Monocytes Absolute: 0.4 10*3/uL (ref 0.1–1.0)
Monocytes Relative: 13 %
NEUTROS ABS: 1.4 10*3/uL — AB (ref 1.7–7.7)
NEUTROS PCT: 42 %
Platelets: 271 10*3/uL (ref 150–400)
RBC: 4.49 MIL/uL (ref 4.22–5.81)
RDW: 13.6 % (ref 11.5–15.5)
WBC: 3.3 10*3/uL — AB (ref 4.0–10.5)

## 2015-02-17 LAB — TOBRAMYCIN LEVEL, TROUGH

## 2015-02-17 LAB — CREATININE, SERUM: Creatinine, Ser: 0.57 mg/dL — ABNORMAL LOW (ref 0.61–1.24)

## 2015-02-17 LAB — BUN: BUN: 10 mg/dL (ref 6–20)

## 2015-02-20 ENCOUNTER — Other Ambulatory Visit (HOSPITAL_COMMUNITY)
Admission: AD | Admit: 2015-02-20 | Discharge: 2015-02-20 | Disposition: A | Discharge status: Home / Self Care | Payer: Medicare Other | Source: Skilled Nursing Facility | Attending: Family Medicine | Admitting: Family Medicine

## 2015-02-20 DIAGNOSIS — M8530 Osteitis condensans, unspecified site: Secondary | ICD-10-CM | POA: Insufficient documentation

## 2015-02-20 LAB — VANCOMYCIN, TROUGH: Vancomycin Tr: 15 ug/mL (ref 10.0–20.0)

## 2015-02-24 ENCOUNTER — Other Ambulatory Visit (HOSPITAL_COMMUNITY)
Admission: RE | Admit: 2015-02-24 | Discharge: 2015-02-24 | Disposition: A | Discharge status: Home / Self Care | Payer: Medicare Other | Source: Other Acute Inpatient Hospital | Attending: Family Medicine | Admitting: Family Medicine

## 2015-02-24 DIAGNOSIS — M8530 Osteitis condensans, unspecified site: Secondary | ICD-10-CM | POA: Diagnosis present

## 2015-02-24 DIAGNOSIS — Z5181 Encounter for therapeutic drug level monitoring: Secondary | ICD-10-CM | POA: Insufficient documentation

## 2015-02-24 LAB — CBC WITH DIFFERENTIAL/PLATELET
BASOS ABS: 0 10*3/uL (ref 0.0–0.1)
BASOS PCT: 1 %
Eosinophils Absolute: 0.2 10*3/uL (ref 0.0–0.7)
Eosinophils Relative: 4 %
HEMATOCRIT: 36.2 % — AB (ref 39.0–52.0)
HEMOGLOBIN: 12.1 g/dL — AB (ref 13.0–17.0)
LYMPHS PCT: 39 %
Lymphs Abs: 1.5 10*3/uL (ref 0.7–4.0)
MCH: 27.9 pg (ref 26.0–34.0)
MCHC: 33.4 g/dL (ref 30.0–36.0)
MCV: 83.4 fL (ref 78.0–100.0)
MONO ABS: 0.6 10*3/uL (ref 0.1–1.0)
Monocytes Relative: 15 %
NEUTROS ABS: 1.6 10*3/uL — AB (ref 1.7–7.7)
NEUTROS PCT: 41 %
Platelets: 227 10*3/uL (ref 150–400)
RBC: 4.34 MIL/uL (ref 4.22–5.81)
RDW: 13.8 % (ref 11.5–15.5)
WBC: 3.9 10*3/uL — ABNORMAL LOW (ref 4.0–10.5)

## 2015-02-24 LAB — CREATININE, SERUM: Creatinine, Ser: 0.57 mg/dL — ABNORMAL LOW (ref 0.61–1.24)

## 2015-02-24 LAB — BUN: BUN: 15 mg/dL (ref 6–20)

## 2015-02-24 LAB — VANCOMYCIN, TROUGH: VANCOMYCIN TR: 15 ug/mL (ref 10.0–20.0)

## 2015-03-03 ENCOUNTER — Other Ambulatory Visit (HOSPITAL_COMMUNITY)
Admission: RE | Admit: 2015-03-03 | Discharge: 2015-03-03 | Disposition: A | Discharge status: Home / Self Care | Payer: Medicare Other | Source: Other Acute Inpatient Hospital | Attending: Family Medicine | Admitting: Family Medicine

## 2015-03-03 DIAGNOSIS — Z5181 Encounter for therapeutic drug level monitoring: Secondary | ICD-10-CM | POA: Insufficient documentation

## 2015-03-03 DIAGNOSIS — M8530 Osteitis condensans, unspecified site: Secondary | ICD-10-CM | POA: Diagnosis present

## 2015-03-03 LAB — CBC WITH DIFFERENTIAL/PLATELET
BASOS PCT: 0 %
Basophils Absolute: 0 10*3/uL (ref 0.0–0.1)
Eosinophils Absolute: 0.2 10*3/uL (ref 0.0–0.7)
Eosinophils Relative: 4 %
HEMATOCRIT: 36.5 % — AB (ref 39.0–52.0)
Hemoglobin: 12.5 g/dL — ABNORMAL LOW (ref 13.0–17.0)
LYMPHS ABS: 1.3 10*3/uL (ref 0.7–4.0)
Lymphocytes Relative: 30 %
MCH: 28.2 pg (ref 26.0–34.0)
MCHC: 34.2 g/dL (ref 30.0–36.0)
MCV: 82.2 fL (ref 78.0–100.0)
MONO ABS: 0.5 10*3/uL (ref 0.1–1.0)
MONOS PCT: 11 %
NEUTROS ABS: 2.4 10*3/uL (ref 1.7–7.7)
Neutrophils Relative %: 55 %
Platelets: 235 10*3/uL (ref 150–400)
RBC: 4.44 MIL/uL (ref 4.22–5.81)
RDW: 13.5 % (ref 11.5–15.5)
WBC: 4.3 10*3/uL (ref 4.0–10.5)

## 2015-03-03 LAB — CREATININE, SERUM
Creatinine, Ser: 0.65 mg/dL (ref 0.61–1.24)
GFR calc Af Amer: >60 mL/min (ref >60)

## 2015-03-03 LAB — BUN: BUN: 18 mg/dL (ref 6–20)

## 2015-03-03 LAB — VANCOMYCIN, TROUGH: Vancomycin Tr: 16 ug/mL (ref 10.0–20.0)

## 2015-03-18 ENCOUNTER — Emergency Department (HOSPITAL_COMMUNITY)
Admission: EM | Admit: 2015-03-18 | Discharge: 2015-03-18 | Disposition: A | Discharge status: Home / Self Care | Payer: Medicare Other | Attending: Emergency Medicine | Admitting: Emergency Medicine

## 2015-03-18 ENCOUNTER — Emergency Department (HOSPITAL_COMMUNITY): Payer: Medicare Other

## 2015-03-18 ENCOUNTER — Encounter (HOSPITAL_COMMUNITY): Payer: Self-pay | Admitting: Emergency Medicine

## 2015-03-18 DIAGNOSIS — Z79891 Long term (current) use of opiate analgesic: Secondary | ICD-10-CM | POA: Insufficient documentation

## 2015-03-18 DIAGNOSIS — Z79899 Other long term (current) drug therapy: Secondary | ICD-10-CM | POA: Diagnosis not present

## 2015-03-18 DIAGNOSIS — N390 Urinary tract infection, site not specified: Secondary | ICD-10-CM | POA: Insufficient documentation

## 2015-03-18 DIAGNOSIS — Z8614 Personal history of Methicillin resistant Staphylococcus aureus infection: Secondary | ICD-10-CM | POA: Insufficient documentation

## 2015-03-18 DIAGNOSIS — Z87891 Personal history of nicotine dependence: Secondary | ICD-10-CM | POA: Insufficient documentation

## 2015-03-18 DIAGNOSIS — Z8619 Personal history of other infectious and parasitic diseases: Secondary | ICD-10-CM | POA: Insufficient documentation

## 2015-03-18 DIAGNOSIS — Z8669 Personal history of other diseases of the nervous system and sense organs: Secondary | ICD-10-CM | POA: Diagnosis not present

## 2015-03-18 DIAGNOSIS — Z862 Personal history of diseases of the blood and blood-forming organs and certain disorders involving the immune mechanism: Secondary | ICD-10-CM | POA: Insufficient documentation

## 2015-03-18 DIAGNOSIS — M62838 Other muscle spasm: Secondary | ICD-10-CM | POA: Diagnosis not present

## 2015-03-18 DIAGNOSIS — F419 Anxiety disorder, unspecified: Secondary | ICD-10-CM | POA: Insufficient documentation

## 2015-03-18 DIAGNOSIS — F329 Major depressive disorder, single episode, unspecified: Secondary | ICD-10-CM | POA: Insufficient documentation

## 2015-03-18 DIAGNOSIS — R000 Tachycardia, unspecified: Secondary | ICD-10-CM | POA: Insufficient documentation

## 2015-03-18 DIAGNOSIS — R509 Fever, unspecified: Secondary | ICD-10-CM | POA: Diagnosis present

## 2015-03-18 HISTORY — DX: Osteomyelitis, unspecified: M86.9

## 2015-03-18 LAB — URINALYSIS, ROUTINE W REFLEX MICROSCOPIC
GLUCOSE, UA: NEGATIVE mg/dL
Nitrite: POSITIVE — AB
PROTEIN: 100 mg/dL — AB
pH: 5.5 (ref 5.0–8.0)

## 2015-03-18 LAB — CBC WITH DIFFERENTIAL/PLATELET
Basophils Absolute: 0 10*3/uL (ref 0.0–0.1)
Basophils Relative: 0 %
EOS ABS: 0 10*3/uL (ref 0.0–0.7)
EOS PCT: 0 %
HCT: 35.7 % — ABNORMAL LOW (ref 39.0–52.0)
Hemoglobin: 12 g/dL — ABNORMAL LOW (ref 13.0–17.0)
LYMPHS ABS: 0.6 10*3/uL — AB (ref 0.7–4.0)
LYMPHS PCT: 5 %
MCH: 27.6 pg (ref 26.0–34.0)
MCHC: 33.6 g/dL (ref 30.0–36.0)
MCV: 82.1 fL (ref 78.0–100.0)
MONO ABS: 0.9 10*3/uL (ref 0.1–1.0)
MONOS PCT: 7 %
Neutro Abs: 11 10*3/uL — ABNORMAL HIGH (ref 1.7–7.7)
Neutrophils Relative %: 88 %
PLATELETS: 202 10*3/uL (ref 150–400)
RBC: 4.35 MIL/uL (ref 4.22–5.81)
RDW: 13.7 % (ref 11.5–15.5)
WBC: 12.5 10*3/uL — AB (ref 4.0–10.5)

## 2015-03-18 LAB — URINE MICROSCOPIC-ADD ON

## 2015-03-18 LAB — COMPREHENSIVE METABOLIC PANEL
ALT: 11 U/L — AB (ref 17–63)
ANION GAP: 9 (ref 5–15)
AST: 17 U/L (ref 15–41)
Albumin: 3.6 g/dL (ref 3.5–5.0)
Alkaline Phosphatase: 89 U/L (ref 38–126)
BUN: 11 mg/dL (ref 6–20)
CHLORIDE: 100 mmol/L — AB (ref 101–111)
CO2: 25 mmol/L (ref 22–32)
CREATININE: 0.56 mg/dL — AB (ref 0.61–1.24)
Calcium: 8.8 mg/dL — ABNORMAL LOW (ref 8.9–10.3)
Glucose, Bld: 122 mg/dL — ABNORMAL HIGH (ref 65–99)
Potassium: 3.4 mmol/L — ABNORMAL LOW (ref 3.5–5.1)
SODIUM: 134 mmol/L — AB (ref 135–145)
Total Bilirubin: 0.6 mg/dL (ref 0.3–1.2)
Total Protein: 7.7 g/dL (ref 6.5–8.1)

## 2015-03-18 LAB — I-STAT CG4 LACTIC ACID, ED: LACTIC ACID, VENOUS: 1.21 mmol/L (ref 0.5–2.0)

## 2015-03-18 MED ORDER — SODIUM CHLORIDE 0.9 % IV BOLUS (SEPSIS)
1000.0000 mL | Freq: Once | INTRAVENOUS | Status: AC
  Administered 2015-03-18: 1000 mL via INTRAVENOUS

## 2015-03-18 MED ORDER — CEFTRIAXONE SODIUM 1 G IJ SOLR
1.0000 g | Freq: Once | INTRAMUSCULAR | Status: AC
  Administered 2015-03-18: 1 g via INTRAVENOUS
  Filled 2015-03-18: qty 10

## 2015-03-18 MED ORDER — FENTANYL CITRATE (PF) 100 MCG/2ML IJ SOLN
50.0000 ug | Freq: Once | INTRAMUSCULAR | Status: AC
  Administered 2015-03-18: 50 ug via INTRAVENOUS
  Filled 2015-03-18: qty 2

## 2015-03-18 MED ORDER — CEPHALEXIN 500 MG PO CAPS: 500.0000 mg | ORAL_CAPSULE | Freq: Four times a day (QID) | ORAL | Status: DC

## 2015-03-18 NOTE — Discharge Instructions (Signed)

## 2015-03-18 NOTE — ED Provider Notes (Signed)
CSN: [id number]     Arrival date & time 03/18/15  1157 History  By signing my name below, I, Jarvis Morgan, attest that this documentation has been prepared under the direction and in the presence of Eber Hong, MD. Electronically Signed: Jarvis Morgan, ED Scribe. 03/18/2015. 2:24 PM.    Chief Complaint  Patient presents with  . Fever    The history is provided by the patient. No language interpreter was used.    HPI Comments: [name] is a 55 y.o. 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 He reports associated HA, mild nausea, malodorous and dark colored urine.. Pt endorses he is having pain radiating from his right buttock into his right thigh with intermittent muscle spasms. Pt notes that 6 weeks ago he was diagnosed with Vancomycin for his osteomyelitis. Pt states the pic line came out around 1-2 weeks ago with no significant improvement in symptoms. Pt notes he is staying well hydrated. He denies any cough, congestion, diarrhea, abdominal pain or other associated symptoms.  Past Medical History  Diagnosis Date  . Paraparesis (HCC)     parapalegic   . Depression   . Anxiety   . Hepatitis C   . MRSA infection   . Anemia     Patient states that he was recently told this  . Paralysis (HCC)   . Osteomyelitis [facility])    Past Surgical History  Procedure Laterality Date  . Back surgery    . Gallbladder surgery  2010  . Femur fracture surgery  March 2012    retrograde intramedullary femoral nail  . Hernia repair    . Cholecystectomy    . Orif femur fracture Right 08/04/2012    Procedure: OPEN REDUCTION INTERNAL FIXATION (ORIF) DISTAL FEMUR FRACTURE;  Surgeon: Eldred Manges, MD;  Location: [facility] OR;  Service: Orthopedics;  Laterality: Right;  Right Retrograde Supracondylar Nail Right Femur   Family History  Problem Relation Age of Onset  . Cancer      family  history   . Ovarian cancer Mother   . Healthy Sister   . Hodgkin's lymphoma Sister   . Heart disease Sister   . Healthy Sister   . Healthy Son   . Healthy Daughter   . Anemia Daughter   . Thyroid disease Daughter    Social History  Substance Use Topics  . Smoking status: Former Smoker -- 1.00 packs/day for 5 years    Types: Cigarettes    Quit date: 08/10/2010  . Smokeless tobacco: Never Used  . Alcohol Use: No     Comment: Patient quit drinking 1 year ago    Review of Systems  All other systems reviewed and are negative.     Allergies  Review of patient's allergies indicates no known allergies.  Home Medications   Prior to Admission medications   Medication Sig Start Date End Date Taking? Authorizing Provider  methadone (DOLOPHINE) 10 MG tablet Take 60 mg by mouth 3 (three) times daily. For pain   Yes Historical Provider, MD  pregabalin (LYRICA) 75 MG capsule Take 75 mg by mouth 3 (three) times daily.    Yes Historical Provider, MD  traMADol (ULTRAM) 50 MG tablet Take 50 mg by mouth 3 (three) times daily as needed for moderate pain.  01/09/15  Yes Historical Provider, MD  cephALEXin (KEFLEX) 500 MG capsule Take 1 capsule (500 mg total) by mouth 4 (four)  times daily. 03/18/15   Eber HongBrian Anitra Doxtater, MD   Triage Vitals: BP 142/84 mmHg  Pulse 100  Temp(Src) 98.4 F (36.9 C) (Oral)  Resp 20  Ht 5\' 9"  (1.753 m)  Wt 150 lb (68.04 kg)  BMI 22.14 kg/m2  SpO2 98%  Physical Exam  Constitutional: He is oriented to person, place, and time. He appears well-developed and well-nourished.  HENT:  Head: Normocephalic and atraumatic.  Eyes: Conjunctivae and EOM are normal. Pupils are equal, round, and reactive to light. Right eye exhibits no discharge. Left eye exhibits no discharge. No scleral icterus.  Neck: Normal range of motion. Neck supple.  Cardiovascular: Normal rate, regular rhythm and normal heart sounds.  Exam reveals no gallop.   No murmur heard. Mild tachycardia   Pulmonary/Chest: Effort normal and breath sounds normal.  Abdominal: Soft. Bowel sounds are normal.  Soft NT abd  Genitourinary:  Single hemorrhoid visible in the anus - no rectal exam performed  Musculoskeletal: He exhibits no edema.  Muscle spasms in the RLE with movement - thigh.  Neurological: He is alert and oriented to person, place, and time.  Right lower extremity is flaccid, left lower extremties able to SLR Strength is 4-/5  Skin: Skin is warm and dry.  Decubitus ulcer in the R glut / healing well - no erythema, no drainage, no indusration and no foul smell - well appearing.  Psychiatric: He has a normal mood and affect. His behavior is normal.  Nursing note and vitals reviewed.   ED Course  Procedures (including critical care time)  DIAGNOSTIC STUDIES: Oxygen Saturation is 98% on RA, normal by my interpretation.    COORDINATION OF CARE: 12:29 PM- Will order IV fluids, CXR and diagnostic lab workup. Pt advised of plan for treatment and pt agrees.  Labs Review Labs Reviewed  CBC WITH DIFFERENTIAL/PLATELET - Abnormal; Notable for the following:    WBC 12.5 (*)    Hemoglobin 12.0 (*)    HCT 35.7 (*)    Neutro Abs 11.0 (*)    Lymphs Abs 0.6 (*)    All other components within normal limits  COMPREHENSIVE METABOLIC PANEL - Abnormal; Notable for the following:    Sodium 134 (*)    Potassium 3.4 (*)    Chloride 100 (*)    Glucose, Bld 122 (*)    Creatinine, Ser 0.56 (*)    Calcium 8.8 (*)    ALT 11 (*)    All other components within normal limits  URINALYSIS, ROUTINE W REFLEX MICROSCOPIC (NOT AT [facility]) - Abnormal; Notable for the following:    Specific Gravity, Urine >1.030 (*)    Hgb urine dipstick SMALL (*)    Bilirubin Urine MODERATE (*)    Ketones, ur TRACE (*)    Protein, ur 100 (*)    Nitrite POSITIVE (*)    Leukocytes, UA SMALL (*)    All other components within normal limits  URINE MICROSCOPIC-ADD ON - Abnormal; Notable for the following:    Squamous  Epithelial / LPF 0-5 (*)    Bacteria, UA MANY (*)    All other components within normal limits  CULTURE, BLOOD (ROUTINE X 2)  CULTURE, BLOOD (ROUTINE X 2)  URINE CULTURE  I-STAT CG4 LACTIC ACID, ED    Imaging Review Dg Chest Port 1 View  03/18/2015  CLINICAL DATA:  Fever, ex-smoker. EXAM: PORTABLE CHEST 1 VIEW COMPARISON:  01/29/2015 FINDINGS: Heart and mediastinal contours are within normal limits. No focal opacities or effusions. No acute bony abnormality.  Spinal hardware noted, unchanged. IMPRESSION: No active disease. Electronically Signed   By: Charlett Nose M.D.   On: 03/18/2015 12:51   I have personally reviewed and evaluated these images and lab results as part of my medical decision-making.    MDM   Final diagnoses:  UTI (lower urinary tract infection)   Well appearing, no overt signs of sepsis - no tachycardia, no hypotension, no fever, no hypoxia.  Decub appears clean and well healed  Has not had PICC in over a week, doubt bacteremia or DVT - no signs limb swelling  UA / labs / xray  UTI present - meds ordered - other labs reassuring, pt informed and is in agreement with plan - he is very well appaering on d/c.  I personally performed the services described in this documentation, which was scribed in my presence. The recorded information has been reviewed and is accurate.       Eber Hong, MD 03/18/15 [phone]

## 2015-03-18 NOTE — ED Notes (Signed)
MD at bedside. 

## 2015-03-18 NOTE — ED Notes (Signed)
Pt states that he has been running a fever since yesterday and generally not feeling well.  States that he just finished 6 weeks of home Vancomycin for osteomyelitis and is concerned his sepsis may be returning.

## 2015-03-22 LAB — URINE CULTURE

## 2015-03-23 LAB — CULTURE, BLOOD (ROUTINE X 2)
Culture: NO GROWTH
Culture: NO GROWTH

## 2015-03-24 ENCOUNTER — Telehealth: Payer: Self-pay | Admitting: *Deleted

## 2015-03-24 NOTE — ED Notes (Signed)
(+)  urine culture, treated with Cephalexin, no further treatment needed, Corey Ball, Pharm 

## 2015-04-18 ENCOUNTER — Encounter (HOSPITAL_COMMUNITY): Payer: Self-pay | Admitting: Emergency Medicine

## 2015-04-18 ENCOUNTER — Emergency Department (HOSPITAL_COMMUNITY)
Admission: EM | Admit: 2015-04-18 | Discharge: 2015-04-18 | Discharge status: Against Medical Advice | Payer: Medicare Other | Attending: Emergency Medicine | Admitting: Emergency Medicine

## 2015-04-18 DIAGNOSIS — Z8669 Personal history of other diseases of the nervous system and sense organs: Secondary | ICD-10-CM | POA: Diagnosis not present

## 2015-04-18 DIAGNOSIS — Z79899 Other long term (current) drug therapy: Secondary | ICD-10-CM | POA: Diagnosis not present

## 2015-04-18 DIAGNOSIS — Z87891 Personal history of nicotine dependence: Secondary | ICD-10-CM | POA: Insufficient documentation

## 2015-04-18 DIAGNOSIS — R509 Fever, unspecified: Secondary | ICD-10-CM | POA: Diagnosis present

## 2015-04-18 DIAGNOSIS — Z139 Encounter for screening, unspecified: Secondary | ICD-10-CM | POA: Insufficient documentation

## 2015-04-18 DIAGNOSIS — Z8739 Personal history of other diseases of the musculoskeletal system and connective tissue: Secondary | ICD-10-CM | POA: Insufficient documentation

## 2015-04-18 DIAGNOSIS — N390 Urinary tract infection, site not specified: Secondary | ICD-10-CM | POA: Diagnosis not present

## 2015-04-18 DIAGNOSIS — Z8619 Personal history of other infectious and parasitic diseases: Secondary | ICD-10-CM | POA: Diagnosis not present

## 2015-04-18 DIAGNOSIS — Z862 Personal history of diseases of the blood and blood-forming organs and certain disorders involving the immune mechanism: Secondary | ICD-10-CM | POA: Diagnosis not present

## 2015-04-18 DIAGNOSIS — Z8614 Personal history of Methicillin resistant Staphylococcus aureus infection: Secondary | ICD-10-CM | POA: Insufficient documentation

## 2015-04-18 LAB — CBC WITH DIFFERENTIAL/PLATELET
BASOS ABS: 0 10*3/uL (ref 0.0–0.1)
BASOS PCT: 0 %
EOS ABS: 0 10*3/uL (ref 0.0–0.7)
EOS PCT: 0 %
HCT: 36.5 % — ABNORMAL LOW (ref 39.0–52.0)
Hemoglobin: 12.2 g/dL — ABNORMAL LOW (ref 13.0–17.0)
Lymphocytes Relative: 18 %
Lymphs Abs: 0.8 10*3/uL (ref 0.7–4.0)
MCH: 27.2 pg (ref 26.0–34.0)
MCHC: 33.4 g/dL (ref 30.0–36.0)
MCV: 81.5 fL (ref 78.0–100.0)
MONO ABS: 0.5 10*3/uL (ref 0.1–1.0)
Monocytes Relative: 10 %
Neutro Abs: 3.4 10*3/uL (ref 1.7–7.7)
Neutrophils Relative %: 72 %
PLATELETS: 206 10*3/uL (ref 150–400)
RBC: 4.48 MIL/uL (ref 4.22–5.81)
RDW: 13.8 % (ref 11.5–15.5)
WBC: 4.8 10*3/uL (ref 4.0–10.5)

## 2015-04-18 LAB — URINALYSIS, ROUTINE W REFLEX MICROSCOPIC
Bilirubin Urine: NEGATIVE
GLUCOSE, UA: NEGATIVE mg/dL
Hgb urine dipstick: NEGATIVE
KETONES UR: NEGATIVE mg/dL
Nitrite: NEGATIVE
PROTEIN: NEGATIVE mg/dL
Specific Gravity, Urine: 1.02 (ref 1.005–1.030)
pH: 5.5 (ref 5.0–8.0)

## 2015-04-18 LAB — URINE MICROSCOPIC-ADD ON: RBC / HPF: NONE SEEN RBC/hpf (ref 0–5)

## 2015-04-18 LAB — BASIC METABOLIC PANEL
ANION GAP: 9 (ref 5–15)
BUN: 10 mg/dL (ref 6–20)
CALCIUM: 8.8 mg/dL — AB (ref 8.9–10.3)
CO2: 25 mmol/L (ref 22–32)
Chloride: 100 mmol/L — ABNORMAL LOW (ref 101–111)
Creatinine, Ser: 0.57 mg/dL — ABNORMAL LOW (ref 0.61–1.24)
GLUCOSE: 86 mg/dL (ref 65–99)
Potassium: 3.8 mmol/L (ref 3.5–5.1)
SODIUM: 134 mmol/L — AB (ref 135–145)

## 2015-04-18 MED ORDER — CEPHALEXIN 500 MG PO CAPS: 500.0000 mg | ORAL_CAPSULE | Freq: Four times a day (QID) | ORAL | Status: DC

## 2015-04-18 NOTE — Discharge Instructions (Signed)
°Emergency Department Resource Guide °1) Find a Doctor and Pay Out of Pocket °Although you won't have to find out who is covered by your insurance plan, it is a good idea to ask around and get recommendations. You will then need to call the office and see if the doctor you have chosen will accept you as a new patient and what types of options they offer for patients who are self-pay. Some doctors offer discounts or will set up payment plans for their patients who do not have insurance, but you will need to ask so you aren't surprised when you get to your appointment. ° °2) Contact Your Local Health Department °Not all health departments have doctors that can see patients for sick visits, but many do, so it is worth a call to see if yours does. If you don't know where your local health department is, you can check in your phone book. The CDC also has a tool to help you locate your state's health department, and many state websites also have listings of all of their local health departments. ° °3) Find a Walk-in Clinic °If your illness is not likely to be very severe or complicated, you may want to try a walk in clinic. These are popping up all over the country in pharmacies, drugstores, and shopping centers. They're usually staffed by nurse practitioners or physician assistants that have been trained to treat common illnesses and complaints. They're usually fairly quick and inexpensive. However, if you have serious medical issues or chronic medical problems, these are probably not your best option. ° °No Primary Care Doctor: °- Call [facility] at  832-8000 - they can help you locate a primary care doctor that  accepts your insurance, provides certain services, etc. °- Physician Referral Service- 1-800-533-3463 ° °Chronic Pain Problems: °Organization         Address  Phone   Notes  °[facility]  (336) 297-2271 Patients need to be referred by their primary care doctor.  ° °Medication  Assistance: °Organization         Address  Phone   Notes  °[place] [facility] [address] °[place], [place] [address] (336) 641-8030 --Must be a resident of [place] °-- Must have NO insurance coverage whatsoever (no Medicaid/ Medicare, etc.) °-- The pt. MUST have a primary care doctor that directs their care regularly and follows them in the community °  °[facility]  (866) 331-1348   °[employer]  (888) 892-1162   ° °Agencies that provide inexpensive medical care: °Organization         Address  Phone   Notes  °[facility]  (336) 832-8035   °[facility]    (336) 832-7272   °[facility] [address] °[place],  [address] (336) 832-4777   °[facility] [address], °[place] (336) 271-4999   °[facility]    (336) 373-0678   °[facility]    (336) 272-1050   °[facility] ° [address], [place] Phone:  (336) 832-4444, Fax:  (336) 832-4440 Hours of Operation:  9 am - 6 pm, M-F.  Also accepts Medicaid/Medicare and self-pay.  °[facility] ° [address], [place] Phone: (336) 832-3150, Fax: (336) [phone]. Hours of Operation:  8:30 am - 5:30 pm, M-F.  Also accepts Medicaid and self-pay.  °[facility] [place] [address], [place] Phone: (336) 878-6027   °[facility] [address], [place], [place] (336)723-1848, Ext. 123 Mondays & Thursdays: 7-9 AM.  First 15 patients are seen on a first come, first serve basis. °  ° °Medicaid-accepting [place] Providers: ° °Organization         Address  Phone   Notes  °[facility] [address], [place] (336) 641-2100 Also accepts self-pay patients.  °[facility] [address], [place] ° (336) 856-9996   °[facility] [address], [place]  (336) 288-8857   °[facility] [address] [place] [address], [place] (336) 299-7000   °Veita Bland [address], [place]  ° (336) 373-1557 Only accepts [place] Access Medicaid patients after they have their name applied to their card.  ° °Self-Pay (no insurance) in [place]: ° °Organization         Address  Phone   Notes  °Sickle Cell Patients, [facility] [address], [place] (336) 832-1970   °[facility] [address], [place] (336) 832-4400   °[facility] [place] ° [address],  (336) 992-4800   °[facility]/Dr. Osei-Bonsu ° [address] [place] [address], [place] or [address], [place] (336) 841-8500 Phone number for both [place] and [place] locations is the same.  °[facility] [address], [place] (336) 299-0000   °[facility] [address] [place] [address], [place] or [address] (336) 852-7530 °(336) 878-2260   °[facility] [address], [place] (336) 350-1642, phone; (336) 294-5005, fax Sees patients 1st and 3rd Saturday of every month.  Must not qualify for public or private insurance (i.e. Medicaid, Medicare, [place] Health Choice, Veterans' Benefits) • Household income should be no more than 200% of the poverty level •The clinic cannot treat you if you are pregnant or think you are pregnant • Sexually transmitted diseases are not treated at the clinic.  ° ° °Dental Care: °Organization         Address  Phone  Notes  °[place] [facility] [address], [place] (336) 641-6152 Accepts children up to age 21 who are enrolled in Medicaid or [place] Health Choice; pregnant women with a Medicaid card; and children who have applied for Medicaid or [place] Health Choice, but were declined, whose parents can pay a reduced fee at time of service.  °[place]  [facility] [place]  [address], [place] (336) 641-7733 Accepts children up to age 21 who are enrolled in Medicaid or [place] Health Choice; pregnant women with a Medicaid card; and children who have applied for Medicaid or  Health Choice, but were declined, whose parents can pay a reduced fee at time of service.  °[facility] ° [address], [place] (336) 641-4533 Patients are seen by appointment only. Walk-ins are not accepted. [facility] will see patients 18 years of age and older. °Monday - Tuesday (8am-5pm) °Most Wednesdays (8:30-5pm) °$30 per visit, cash only  °[facility] ° [address], [place] (336) 641-4533 Patients are seen by appointment only. Walk-ins are not accepted. [facility] will see patients 18 years of age and older. °One   Wednesday Evening (Monthly: Volunteer Based).  $30 per visit, cash only  °[facility]  (919) 537-3737 for adults; Children under age 4, call Graduate Pediatric Dentistry at (919) [phone]. Children aged 4-14, please call (919) [phone] to request a pediatric application. ° Dental services are provided in all areas of dental care including fillings, crowns and bridges, complete and partial dentures, implants, gum treatment, root canals, and extractions. Preventive care is also provided. Treatment is provided to both adults and children. °Patients are selected via a lottery and there is often a waiting list. °  °[facility] [address], °[place] ° (336) 763-8833 [email or web address] °  °[facility] [address], [place], [place] (336)723-1848, Ext. 123 Second and Fourth Thursday of each month, opens at 6:30 AM; Clinic ends at 9 AM.  Patients are seen on a first-come first-served basis, and a limited number are seen during each clinic.  ° °[facility] ° [address], [place], [place] (336) 723-7904    Eligibility Requirements °You must have lived in [place], [place], or [place] counties for at least the last three months. °  You cannot be eligible for state or federal sponsored healthcare insurance, including Veterans Administration, Medicaid, or Medicare. °  You generally cannot be eligible for healthcare insurance through your employer.  °  How to apply: °Eligibility screenings are held every Tuesday and Wednesday afternoon from 1:00 pm until 4:00 pm. You do not need an appointment for the interview!  °[facility] [address], [place], [place] 336-631-2330   °[facility] [facility]  336-342-8273   °[place]  336-703-3100   °[facility]  336-570-6415   ° °Behavioral Health Resources in the Community: °Intensive Outpatient Programs °Organization         Address  Phone  Notes  °[facility] [address], [place], [place] 336-878-6098   °[facility] [address], [place], [place] 336-832-9800   °[facility] [address], [place], [place] ° 336-882-2125   °[facility] [address],  °[place], [place] 1-800-853-5163 or 336-641-4981   °Substance Abuse Resources °Organization         Address  Phone  Notes  °[facility]  336-882-2125   °[facility]  336-784-9470   °[facility]  336-285-9073   °[facility]  336-845-3988   °Residential & Outpatient Substance Abuse Program  1-800-659-3381   °Psychological Services °Organization         Address  Phone  Notes  °[facility]  336- 832-9600   °[facility]  336- 378-7881   °[facility] [address], [place] 1-800-853-5163 or 336-641-4981   ° °Mobile Crisis Teams °Organization         Address  Phone  Notes  °[facility], Mobile Crisis Care Unit  1-877-626-1772   °Assertive °[facility] ° [address]  [place], [place] 336-834-9664   °Sharon DeEsch [address] °[place] 336-554-5454   ° °Self-Help/Support Groups °Organization         Address  Phone             Notes  °[facility] - variety of support groups  336- 373-1402 Call for more information  °Narcotics Anonymous (NA), [facility] [address], °[place]   2 meetings at this location  ° °  Residential Treatment Programs °Organization         Address  Phone  Notes  °[facility] [address],    °[place]  1-866-801-8205   °[facility] ° [address], [place], [place] 704-293-8524   °[facility] [address], [place] 336-845-3988 Admissions: 8am-3pm M-F  °[facility] [address]    °[place], [place] 336-841-1104   °[facility] [address], [place], [place] 336-379-7146   °[facility] [address]  °[place], [place] 336-285-9073   °[facility] - Intensive Outpatient [address], [place], [place] 336-852-3033   °[facility] [address]  °[place], [place] 1-877-615-2722 or 336-784-9470   °[facility] [address] [place], [place] 336-227-7417 Accepts Medicaid  °[facility] [address]  °[place] 1-800-659-3381 Substance Abuse/Addiction Treatment  ° °[place] Behavioral Health Resources °Organization         Address  Phone  Notes  °[facility]  (888) 581-9988   °Julie Brannon, PhD [address] [place], [place]   (336) 349-5553 or (336) 951-0000   °[facility]   [address] °[place], [place] (336) 349-4454   °[facility] [address], [place], [place] (336) 342-8316 Insurance/Medicaid/sponsorship through [facility]  °[facility] [address]                                    [place], [place] (336) 342-8316 Therapy/tele-psych/case    °[facility] [address]  ° [place], [place] (336) 349-2233    °Dr. Arfeen  (336) 349-4544   °[facility] [place]  [employer] [place] [facility] 1) [address], [place] °2) [address], [place] °3)  [address], [place] (336) 349-3220 °(336) 342-7768 ° °(336) 342-8140   °[place] Child Abuse [facility] (336) 342-1394 or (336) 342-3537 (After Hours)    ° ° °Take the prescription as directed.  Call your regular medical doctor on Monday to schedule a follow up appointment within the next 3 days.  Return to the Emergency Department immediately sooner if worsening.  ° °

## 2015-04-18 NOTE — ED Notes (Signed)
Pt signed out ama due to states "I haven't ate anything all day and I have to go"

## 2015-04-18 NOTE — ED Notes (Signed)
Patient states "I was on my way to [employer] and started getting dizzy and I've been running a fever. I hope I'm not septic from this bed sore on my bottom or the UTI that I had recently." Patient alert and oriented, very talkative at triage.

## 2015-04-18 NOTE — ED Provider Notes (Signed)
CSN: [id number]     Arrival date & time 04/18/15  1648 History   First MD Initiated Contact with Patient 04/18/15 1935     Chief Complaint  Patient presents with  . Dizziness  . Fever      HPI Pt was seen at 1945. Per pt, c/o gradual onset and resolution of one episode of "feeling like I had a fever" that began PTA. Pt states he checked his temp at home and it was "99." Pt became worried that "the urine infection that I had didn't go away after the antibiotic I took last month." Pt has hx paraplegia and performs self cath. Denies abd pain, no back/flank pain, no objective fevers, no CP/SOB, no cough, no foul smelling urine.    Past Medical History  Diagnosis Date  . Paraparesis (HCC)     parapalegic   . Depression   . Anxiety   . Hepatitis C   . MRSA infection   . Anemia     Patient states that he was recently told this  . Paralysis (HCC)   . Osteomyelitis [facility])    Past Surgical History  Procedure Laterality Date  . Back surgery    . Gallbladder surgery  2010  . Femur fracture surgery  March 2012    retrograde intramedullary femoral nail  . Hernia repair    . Cholecystectomy    . Orif femur fracture Right 08/04/2012    Procedure: OPEN REDUCTION INTERNAL FIXATION (ORIF) DISTAL FEMUR FRACTURE;  Surgeon: Eldred Manges, MD;  Location: [facility] OR;  Service: Orthopedics;  Laterality: Right;  Right Retrograde Supracondylar Nail Right Femur   Family History  Problem Relation Age of Onset  . Cancer      family history   . Ovarian cancer Mother   . Healthy Sister   . Hodgkin's lymphoma Sister   . Heart disease Sister   . Healthy Sister   . Healthy Son   . Healthy Daughter   . Anemia Daughter   . Thyroid disease Daughter    Social History  Substance Use Topics  . Smoking status: Former Smoker -- 1.00 packs/day for 5 years    Types: Cigarettes    Quit date: 08/10/2010  . Smokeless tobacco: Never Used  . Alcohol Use: No     Comment: Patient quit drinking 1 year ago    Review  of Systems ROS: Statement: All systems negative except as marked or noted in the HPI; Constitutional: Negative for objective fever. +"I thought I had a fever." ; ; Eyes: Negative for eye pain, redness and discharge. ; ; ENMT: Negative for ear pain, hoarseness, nasal congestion, sinus pressure and sore throat. ; ; Cardiovascular: Negative for chest pain, palpitations, diaphoresis, dyspnea and peripheral edema. ; ; Respiratory: Negative for cough, wheezing and stridor. ; ; Gastrointestinal: Negative for nausea, vomiting, diarrhea, abdominal pain, blood in stool, hematemesis, jaundice and rectal bleeding. . ; ; Genitourinary: Negative for dysuria, flank pain and hematuria. ; ; Musculoskeletal: Negative for back pain and neck pain. Negative for swelling and trauma.; ; Skin: Negative for pruritus, rash, abrasions, blisters, bruising and skin lesion.; ; Neuro: Negative for headache, lightheadedness and neck stiffness. Negative for weakness, altered level of consciousness , altered mental status, extremity weakness, paresthesias, involuntary movement, seizure and syncope.      Allergies  Review of patient's allergies indicates no known allergies.  Home Medications   Prior to Admission medications   Medication Sig Start Date End Date Taking? Authorizing Provider  methadone (DOLOPHINE) 10 MG tablet Take 20 mg by mouth 3 (three) times daily.   Yes Historical Provider, MD  pregabalin (LYRICA) 75 MG capsule Take 75 mg by mouth 3 (three) times daily.    Yes Historical Provider, MD  traMADol (ULTRAM) 50 MG tablet Take 50 mg by mouth 3 (three) times daily as needed for moderate pain.  01/09/15  Yes Historical Provider, MD  cephALEXin (KEFLEX) 500 MG capsule Take 1 capsule (500 mg total) by mouth 4 (four) times daily. Patient not taking: Reported on 04/18/2015 03/18/15   Eber Hong, MD   BP 138/89 mmHg  Pulse 89  Temp(Src) 98.1 F (36.7 C) (Oral)  Resp 18  Ht 5\' 9"  (1.753 m)  Wt 140 lb (63.504 kg)  BMI 20.67  kg/m2  SpO2 100% Physical Exam  1950: Physical examination:  Nursing notes reviewed; Vital signs and O2 SAT reviewed;  Constitutional: Well developed, Well nourished, Well hydrated, In no acute distress; Head:  Normocephalic, atraumatic; Eyes: EOMI, PERRL, No scleral icterus; ENMT: Mouth and pharynx normal, Mucous membranes moist; Neck: Supple, Full range of motion, No lymphadenopathy; Cardiovascular: Regular rate and rhythm, No gallop; Respiratory: Breath sounds clear & equal bilaterally, No wheezes.  Speaking full sentences with ease, Normal respiratory effort/excursion; Chest: Nontender, Movement normal; Abdomen: Soft, Nontender, Nondistended, Normal bowel sounds; Genitourinary: No CVA tenderness. +right buttock healing wound with serous drainage, no purulence, no odor, no bleeding, no surrounding erythema, no ecchymosis, no soft tissue crepitus..; Extremities: Pulses normal, No tenderness, No edema, No calf edema or asymmetry.; Neuro: AA&Ox3, Major CN grossly intact.  Speech clear. +paraplegia per hx, otherwise no gross focal motor deficits in extremities.; Skin: Color normal, Warm, Dry.; Psych:  Anxious, rapid, pressured speech.    ED Course  Procedures (including critical care time) Labs Review  Imaging Review  I have personally reviewed and evaluated these images and lab results as part of my medical decision-making.   EKG Interpretation None      MDM  MDM Reviewed: previous chart, nursing note and vitals Reviewed previous: labs Interpretation: labs     Results for orders placed or performed during the hospital encounter of 04/18/15  CBC with Differential  Result Value Ref Range   WBC 4.8 4.0 - 10.5 K/uL   RBC 4.48 4.22 - 5.81 MIL/uL   Hemoglobin 12.2 (L) 13.0 - 17.0 g/dL   HCT 19.1 (L) 47.8 - 29.5 %   MCV 81.5 78.0 - 100.0 fL   MCH 27.2 26.0 - 34.0 pg   MCHC 33.4 30.0 - 36.0 g/dL   RDW 62.1 30.8 - 65.7 %   Platelets 206 150 - 400 K/uL   Neutrophils Relative % 72 %    Neutro Abs 3.4 1.7 - 7.7 K/uL   Lymphocytes Relative 18 %   Lymphs Abs 0.8 0.7 - 4.0 K/uL   Monocytes Relative 10 %   Monocytes Absolute 0.5 0.1 - 1.0 K/uL   Eosinophils Relative 0 %   Eosinophils Absolute 0.0 0.0 - 0.7 K/uL   Basophils Relative 0 %   Basophils Absolute 0.0 0.0 - 0.1 K/uL  Basic metabolic panel  Result Value Ref Range   Sodium 134 (L) 135 - 145 mmol/L   Potassium 3.8 3.5 - 5.1 mmol/L   Chloride 100 (L) 101 - 111 mmol/L   CO2 25 22 - 32 mmol/L   Glucose, Bld 86 65 - 99 mg/dL   BUN 10 6 - 20 mg/dL   Creatinine, Ser 8.46 (L) 0.61 - 1.24  mg/dL   Calcium 8.8 (L) 8.9 - 10.3 mg/dL   GFR calc non Af Amer >60 >60 mL/min   GFR calc Af Amer >60 >60 mL/min   Anion gap 9 5 - 15    2130:  Pt states he "can't wait" for his urine results and wants to leave now.  Pt informed I did not have his Udip results, and cannot inform him he does have a UTI. Pt refuses to stay for results.  I encouraged pt to stay, continues to refuse.  Pt makes his own medical decisions.  Risks of AMA explained to pt, including, but not limited to:  UTI, sepsis, stroke, heart attack, cardiac arrythmia ("irregular heart rate/beat"), "passing out," temporary and/or permanent disability, death.  Pt verb understanding and continue to refuse to stay for testing results, understanding the consequences of his decision.  I encouraged pt to follow up with his PMD tomorrow and return to the ED immediately if symptoms return, worsen, or for any other concerns.  Pt verb understanding, agreeable.  2240:  +UTI, UC is pending, rx keflex. Charge RN will call pt with results and rx.   Samuel Jester, DO 04/21/15 2146

## 2015-04-20 LAB — URINE CULTURE: CULTURE: NO GROWTH

## 2015-05-05 ENCOUNTER — Encounter (HOSPITAL_BASED_OUTPATIENT_CLINIC_OR_DEPARTMENT_OTHER): Payer: Medicare Other | Attending: Internal Medicine

## 2015-05-05 DIAGNOSIS — L89314 Pressure ulcer of right buttock, stage 4: Secondary | ICD-10-CM | POA: Insufficient documentation

## 2015-05-05 DIAGNOSIS — G8220 Paraplegia, unspecified: Secondary | ICD-10-CM | POA: Diagnosis not present

## 2015-05-19 ENCOUNTER — Encounter (HOSPITAL_BASED_OUTPATIENT_CLINIC_OR_DEPARTMENT_OTHER): Payer: Medicare Other | Attending: Internal Medicine

## 2015-06-20 ENCOUNTER — Encounter (HOSPITAL_COMMUNITY): Payer: Self-pay | Admitting: Emergency Medicine

## 2015-06-20 ENCOUNTER — Emergency Department (HOSPITAL_COMMUNITY): Payer: Medicare Other

## 2015-06-20 ENCOUNTER — Emergency Department (HOSPITAL_COMMUNITY)
Admission: EM | Admit: 2015-06-20 | Discharge: 2015-06-20 | Disposition: A | Discharge status: Home / Self Care | Payer: Medicare Other | Attending: Emergency Medicine | Admitting: Emergency Medicine

## 2015-06-20 DIAGNOSIS — R51 Headache: Secondary | ICD-10-CM | POA: Diagnosis not present

## 2015-06-20 DIAGNOSIS — Z79899 Other long term (current) drug therapy: Secondary | ICD-10-CM | POA: Insufficient documentation

## 2015-06-20 DIAGNOSIS — Z79891 Long term (current) use of opiate analgesic: Secondary | ICD-10-CM | POA: Diagnosis not present

## 2015-06-20 DIAGNOSIS — Z87891 Personal history of nicotine dependence: Secondary | ICD-10-CM | POA: Diagnosis not present

## 2015-06-20 DIAGNOSIS — F329 Major depressive disorder, single episode, unspecified: Secondary | ICD-10-CM | POA: Diagnosis not present

## 2015-06-20 DIAGNOSIS — R519 Headache, unspecified: Secondary | ICD-10-CM

## 2015-06-20 MED ORDER — METOCLOPRAMIDE HCL 5 MG/ML IJ SOLN
10.0000 mg | Freq: Once | INTRAMUSCULAR | Status: AC
  Administered 2015-06-20: 10 mg via INTRAMUSCULAR
  Filled 2015-06-20: qty 2

## 2015-06-20 MED ORDER — DIPHENHYDRAMINE HCL 50 MG/ML IJ SOLN
25.0000 mg | Freq: Once | INTRAMUSCULAR | Status: AC
  Administered 2015-06-20: 25 mg via INTRAMUSCULAR
  Filled 2015-06-20: qty 1

## 2015-06-20 MED ORDER — KETOROLAC TROMETHAMINE 60 MG/2ML IM SOLN
60.0000 mg | Freq: Once | INTRAMUSCULAR | Status: AC
  Administered 2015-06-20: 60 mg via INTRAMUSCULAR
  Filled 2015-06-20: qty 2

## 2015-06-20 NOTE — Discharge Instructions (Signed)

## 2015-06-20 NOTE — ED Provider Notes (Signed)
CSN: [id number]     Arrival date & time 06/20/15  1039 History  By signing my name below, I, Freida Busmaniana Omoyeni, attest that this documentation has been prepared under the direction and in the presence of Glynn OctaveStephen Giannie Soliday, MD . Electronically Signed: Freida Busmaniana Omoyeni, Scribe. 06/20/2015. 11:09 AM.    Chief Complaint  Patient presents with  . Headache    The history is provided by the patient. No language interpreter was used.     HPI Comments:  [name] is a 55 y.o. male who presents to the Emergency Department complaining of a constant HA x 1 month. He reports pain to the top and back of his head and describes his HA as pressure. He has taken Excedrin without relief. He denies h/o HA prior to this last month. He reports associated nausea and occasional tinnitus. He denies photophobia, phonophobia, vision change, vomiting and fever.  Pt is a paraplegic x 12 years; denies acute weakness.   PCP: Janna Archondiego  Past Medical History  Diagnosis Date  . Paraparesis (HCC)     parapalegic   . Depression   . Anxiety   . Hepatitis C   . MRSA infection   . Anemia     Patient states that he was recently told this  . Paralysis (HCC)   . Osteomyelitis [facility](HCC)    Past Surgical History  Procedure Laterality Date  . Back surgery    . Gallbladder surgery  2010  . Femur fracture surgery  March 2012    retrograde intramedullary femoral nail  . Hernia repair    . Cholecystectomy    . Orif femur fracture Right 08/04/2012    Procedure: OPEN REDUCTION INTERNAL FIXATION (ORIF) DISTAL FEMUR FRACTURE;  Surgeon: Eldred MangesMark C Yates, MD;  Location: [facility] OR;  Service: Orthopedics;  Laterality: Right;  Right Retrograde Supracondylar Nail Right Femur   Family History  Problem Relation Age of Onset  . Cancer      family history   . Ovarian cancer Mother   . Healthy Sister   . Hodgkin's lymphoma Sister   . Heart disease Sister   . Healthy Sister   . Healthy Son   . Healthy Daughter   . Anemia Daughter   . Thyroid  disease Daughter    Social History  Substance Use Topics  . Smoking status: Former Smoker -- 1.00 packs/day for 5 years    Types: Cigarettes    Quit date: 08/10/2010  . Smokeless tobacco: Never Used  . Alcohol Use: No     Comment: Patient quit drinking 1 year ago    Review of Systems  10 systems reviewed and all are negative for acute change except as noted in the HPI.   Allergies  Review of patient's allergies indicates no known allergies.  Home Medications   Prior to Admission medications   Medication Sig Start Date End Date Taking? Authorizing Provider  methadone (DOLOPHINE) 10 MG tablet Take 20 mg by mouth 3 (three) times daily.   Yes Historical Provider, MD  pregabalin (LYRICA) 75 MG capsule Take 75 mg by mouth 3 (three) times daily.    Yes Historical Provider, MD  cephALEXin (KEFLEX) 500 MG capsule Take 1 capsule (500 mg total) by mouth 4 (four) times daily. Patient not taking: Reported on 06/20/2015 04/18/15   Samuel JesterKathleen McManus, DO   BP 109/74 mmHg  Pulse 88  Temp(Src) 98.2 F (36.8 C) (Oral)  Resp 16  Ht 5\' 9"  (1.753 m)  Wt 150 lb (68.04 kg)  BMI 22.14 kg/m2  SpO2 100% Physical Exam  Constitutional: He is oriented to person, place, and time. He appears well-developed and well-nourished. No distress.  HENT:  Head: Normocephalic and atraumatic.  Mouth/Throat: Oropharynx is clear and moist. No oropharyngeal exudate.  Eyes: Conjunctivae and EOM are normal. Pupils are equal, round, and reactive to light.  Neck: Normal range of motion. Neck supple.  No meningismus.  Cardiovascular: Normal rate, regular rhythm, normal heart sounds and intact distal pulses.   No murmur heard. Pulmonary/Chest: Effort normal and breath sounds normal. No respiratory distress.  Abdominal: Soft. There is no tenderness. There is no rebound and no guarding.  Musculoskeletal: Normal range of motion. He exhibits no edema or tenderness.  Neurological: He is alert and oriented to person, place,  and time. No cranial nerve deficit. He exhibits normal muscle tone. Coordination normal.  LE paralysis at baseline Equal grip strength No nystagmus  Skin: Skin is warm.  Scattered macular erythematous rash across occipital scalp and neck; no cellulitis or abscess  Right ischial pressure ulcer appear clean no bone visible   Psychiatric: He has a normal mood and affect. His behavior is normal.  Nursing note and vitals reviewed.   ED Course  Procedures  DIAGNOSTIC STUDIES:  Oxygen Saturation is 100% on RA, normal by my interpretation.    COORDINATION OF CARE:  11:00 AM Discussed treatment plan with pt at bedside and pt agreed to plan.  Labs Review Labs Reviewed - No data to display  Imaging Review Ct Head Wo Contrast  06/20/2015  CLINICAL DATA:  Headache and intermittent dizziness with nausea for 1 month. EXAM: CT HEAD WITHOUT CONTRAST TECHNIQUE: Contiguous axial images were obtained from the base of the skull through the vertex without intravenous contrast. COMPARISON:  02/15/2012 FINDINGS: Skull and Sinuses:Negative for fracture or destructive process. Atelectatic left maxillary sinus, partly visualized. There is likely an internal mucous retention cyst. No acute sinusitis or generalized mucosal thickening. Visualized orbits: Negative. Brain: No evidence of acute infarction, hemorrhage, hydrocephalus, or mass lesion/mass effect. IMPRESSION: Negative.  No explanation for headache. Electronically Signed   By: Marnee Spring M.D.   On: 06/20/2015 12:20   I have personally reviewed and evaluated these images and lab results as part of my medical decision-making.   EKG Interpretation None      MDM   Final diagnoses:  Headache, unspecified headache type   Paraplegic with ongoing diffuse headache for the past 1 month associated with some nausea. Denies fall. Denies any change in his chronic leg weakness. No fever. No vomiting. No history of migraines. Taking Excedrin without  relief.  Nontoxic. No meningismus. Giving animated history without difficulty. Equal grip strengths. Unchanged lower externally weakness.  CT obtained given the patient reports no previous history of this type of headache. Low suspicion for subarachnoid hemorrhage, meningitis, or temporal arteritis. No vision change.  CT head negative. Patient feeling improved and requesting discharge. Will refer to PCP and neurology. Return precautions discussed.  I personally performed the services described in this documentation, which was scribed in my presence. The recorded information has been reviewed and is accurate.    Glynn Octave, MD 06/20/15 1321

## 2015-06-20 NOTE — ED Notes (Signed)
Pt reports HA and intermittent dizziness and nausea x 1 month. Pt hx of head injury, but denies new fall/injury. Pt also report light sensitivity. No hx of migraines. Pt AOx4. Pt is a paraplegic.

## 2015-08-07 ENCOUNTER — Encounter (INDEPENDENT_AMBULATORY_CARE_PROVIDER_SITE_OTHER): Payer: Self-pay | Admitting: Internal Medicine

## 2015-08-07 ENCOUNTER — Ambulatory Visit (INDEPENDENT_AMBULATORY_CARE_PROVIDER_SITE_OTHER): Payer: Self-pay | Admitting: Internal Medicine

## 2016-01-25 ENCOUNTER — Encounter (HOSPITAL_COMMUNITY): Payer: Self-pay | Admitting: Emergency Medicine

## 2016-01-25 ENCOUNTER — Emergency Department (HOSPITAL_COMMUNITY)
Admission: EM | Admit: 2016-01-25 | Discharge: 2016-01-25 | Disposition: A | Discharge status: Home / Self Care | Payer: Medicare Other | Attending: Emergency Medicine | Admitting: Emergency Medicine

## 2016-01-25 DIAGNOSIS — R110 Nausea: Secondary | ICD-10-CM | POA: Diagnosis not present

## 2016-01-25 DIAGNOSIS — L89324 Pressure ulcer of left buttock, stage 4: Secondary | ICD-10-CM | POA: Insufficient documentation

## 2016-01-25 DIAGNOSIS — F419 Anxiety disorder, unspecified: Secondary | ICD-10-CM | POA: Insufficient documentation

## 2016-01-25 DIAGNOSIS — L89224 Pressure ulcer of left hip, stage 4: Secondary | ICD-10-CM | POA: Insufficient documentation

## 2016-01-25 DIAGNOSIS — Z79899 Other long term (current) drug therapy: Secondary | ICD-10-CM | POA: Insufficient documentation

## 2016-01-25 DIAGNOSIS — R42 Dizziness and giddiness: Secondary | ICD-10-CM | POA: Diagnosis present

## 2016-01-25 DIAGNOSIS — L8944 Pressure ulcer of contiguous site of back, buttock and hip, stage 4: Secondary | ICD-10-CM

## 2016-01-25 DIAGNOSIS — Z87891 Personal history of nicotine dependence: Secondary | ICD-10-CM | POA: Diagnosis not present

## 2016-01-25 LAB — COMPREHENSIVE METABOLIC PANEL
ALBUMIN: 3.6 g/dL (ref 3.5–5.0)
ALT: 13 U/L — AB (ref 17–63)
AST: 20 U/L (ref 15–41)
Alkaline Phosphatase: 94 U/L (ref 38–126)
Anion gap: 8 (ref 5–15)
BUN: 11 mg/dL (ref 6–20)
CHLORIDE: 104 mmol/L (ref 101–111)
CO2: 28 mmol/L (ref 22–32)
CREATININE: 0.61 mg/dL (ref 0.61–1.24)
Calcium: 9.2 mg/dL (ref 8.9–10.3)
GFR calc Af Amer: >60 mL/min (ref >60)
GFR calc non Af Amer: >60 mL/min (ref >60)
Glucose, Bld: 134 mg/dL — ABNORMAL HIGH (ref 65–99)
POTASSIUM: 3.6 mmol/L (ref 3.5–5.1)
SODIUM: 140 mmol/L (ref 135–145)
Total Bilirubin: 0.5 mg/dL (ref 0.3–1.2)
Total Protein: 7.8 g/dL (ref 6.5–8.1)

## 2016-01-25 LAB — URINALYSIS, ROUTINE W REFLEX MICROSCOPIC
Bilirubin Urine: NEGATIVE
Glucose, UA: NEGATIVE mg/dL
Hgb urine dipstick: NEGATIVE
Ketones, ur: NEGATIVE mg/dL
Nitrite: NEGATIVE
Protein, ur: NEGATIVE mg/dL
Specific Gravity, Urine: <1.005 — ABNORMAL LOW (ref 1.005–1.030)
pH: 6 (ref 5.0–8.0)

## 2016-01-25 LAB — URINE MICROSCOPIC-ADD ON

## 2016-01-25 LAB — CBC WITH DIFFERENTIAL/PLATELET
Basophils Absolute: 0 K/uL (ref 0.0–0.1)
Basophils Relative: 0 %
Eosinophils Absolute: 0.1 K/uL (ref 0.0–0.7)
Eosinophils Relative: 3 %
HCT: 39.6 % (ref 39.0–52.0)
Hemoglobin: 13.2 g/dL (ref 13.0–17.0)
Lymphocytes Relative: 29 %
Lymphs Abs: 1.2 K/uL (ref 0.7–4.0)
MCH: 27.6 pg (ref 26.0–34.0)
MCHC: 33.3 g/dL (ref 30.0–36.0)
MCV: 82.8 fL (ref 78.0–100.0)
Monocytes Absolute: 0.3 K/uL (ref 0.1–1.0)
Monocytes Relative: 9 %
Neutro Abs: 2.4 K/uL (ref 1.7–7.7)
Neutrophils Relative %: 59 %
Platelets: 254 K/uL (ref 150–400)
RBC: 4.78 MIL/uL (ref 4.22–5.81)
RDW: 13.8 % (ref 11.5–15.5)
WBC: 4 K/uL (ref 4.0–10.5)

## 2016-01-25 LAB — LACTIC ACID, PLASMA: Lactic Acid, Venous: 1.8 mmol/L (ref 0.5–1.9)

## 2016-01-25 MED ORDER — SODIUM CHLORIDE 0.9 % IV BOLUS (SEPSIS)
1000.0000 mL | Freq: Once | INTRAVENOUS | Status: AC
  Administered 2016-01-25: 1000 mL via INTRAVENOUS

## 2016-01-25 NOTE — ED Provider Notes (Signed)
[facility]-EMERGENCY DEPT Provider Note   CSN: [id number] Arrival date & time: 01/25/16  1223   By signing my name below, I, Valentino SaxonBianca Contreras, attest that this documentation has been prepared under the direction and in the presence of Gerhard Munchobert Shanekia Latella, MD. Electronically Signed: Valentino SaxonBianca Contreras, ED Scribe. 01/25/16. 1:13 PM.  History   Chief Complaint Chief Complaint  Patient presents with  . Fever  . Dizziness    The history is provided by the patient. No language interpreter was used.  Dizziness  Associated symptoms: nausea    HPI Comments: [name] is a 55 y.o. male who presents to the Emergency Department complaining of moderate, constant, fever with accompanied dizziness onset yesterday. Pt also reports associated chills that began last night and notes looking pale. Pt reports he ran a fever last night because of his chills, but does not have one today. He does note having constipation. Pt reports no modifying factors noted. He denies abdominal pain, vomiting and diarrhea. No additional complaints at this time.   Past Medical History:  Diagnosis Date  . Anemia    Patient states that he was recently told this  . Anxiety   . Depression   . Hepatitis C   . MRSA infection   . Osteomyelitis (HCC)   . Paralysis (HCC)   . Paraparesis [facility](HCC)    parapalegic     Patient Active Problem List   Diagnosis Date Noted  . Abdominal pain 01/28/2015  . Hepatitis C 05/17/2013  . Closed supracondylar fracture of right femur (HCC) 09/25/2012    Class: Acute  . Right radial head fracture 07/05/2012  . Chronic hepatitis C (HCC) 05/10/2011  . Decubitus ulcer 05/10/2011  . Femur fracture, left (HCC) 06/30/2010  . Paraplegia (HCC) 06/30/2010  . CALCU BD WITHOUT MENTION CHOLECYST/OBSTRUCTION 05/22/2008  . CLOSED FRACTURE OF SHAFT OF TIBIA 03/22/2007    Past Surgical History:  Procedure Laterality Date  . BACK SURGERY    . CHOLECYSTECTOMY    . FEMUR FRACTURE SURGERY  March 2012   retrograde intramedullary femoral nail  . GALLBLADDER SURGERY  2010  . HERNIA REPAIR    . ORIF FEMUR FRACTURE Right 08/04/2012   Procedure: OPEN REDUCTION INTERNAL FIXATION (ORIF) DISTAL FEMUR FRACTURE;  Surgeon: Eldred MangesMark C Yates, MD;  Location: [facility] OR;  Service: Orthopedics;  Laterality: Right;  Right Retrograde Supracondylar Nail Right Femur       Home Medications    Prior to Admission medications   Medication Sig Start Date End Date Taking? Authorizing Provider  cephALEXin (KEFLEX) 500 MG capsule Take 1 capsule (500 mg total) by mouth 4 (four) times daily. Patient not taking: Reported on 06/20/2015 04/18/15   Samuel JesterKathleen McManus, DO  methadone (DOLOPHINE) 10 MG tablet Take 20 mg by mouth 3 (three) times daily.    Historical Provider, MD  pregabalin (LYRICA) 75 MG capsule Take 75 mg by mouth 3 (three) times daily.     Historical Provider, MD    Family History Family History  Problem Relation Age of Onset  . Ovarian cancer Mother   . Healthy Sister   . Hodgkin's lymphoma Sister   . Heart disease Sister   . Healthy Sister   . Healthy Son   . Healthy Daughter   . Anemia Daughter   . Thyroid disease Daughter   . Cancer      family history     Social History Social History  Substance Use Topics  . Smoking status: Former Smoker    Packs/day: 1.00  Years: 5.00    Types: Cigarettes    Quit date: 08/10/2010  . Smokeless tobacco: Never Used  . Alcohol use No     Comment: Patient quit drinking 1 year ago     Allergies   Patient has no known allergies.   Review of Systems Review of Systems  Constitutional: Positive for fever.  HENT: Negative.   Respiratory: Negative.   Cardiovascular: Negative.   Gastrointestinal: Positive for nausea.  Genitourinary:       Self catheterizes no recent changes, but patient states that he is concerned about possible urinary tract infection  Musculoskeletal:       Per history of present illness, otherwise unremarkable  Allergic/Immunologic:  Negative for immunocompromised state.  Neurological: Positive for dizziness.  Psychiatric/Behavioral: The patient is nervous/anxious.      Physical Exam Updated Vital Signs BP 134/90 (BP Location: Left Arm)   Pulse 88   Temp 97.5 F (36.4 C) (Oral)   Resp 16   Ht 5\' 9"  (1.753 m)   Wt 140 lb (63.5 kg)   SpO2 100%   BMI 20.67 kg/m   Physical Exam  Constitutional: He is oriented to person, place, and time. He has a sickly appearance. No distress.  Sickly appearing male awake, alert, and acting appropriately  HENT:  Head: Normocephalic and atraumatic.  Eyes: Conjunctivae and EOM are normal.  Cardiovascular: Normal rate and regular rhythm.   Pulmonary/Chest: Effort normal. No stridor. No respiratory distress.  Abdominal: He exhibits no distension. There is no tenderness.  Musculoskeletal: He exhibits no edema.  Neurological: He is alert and oriented to person, place, and time. He displays atrophy. He exhibits abnormal muscle tone.  Skin: Skin is warm and dry.     Psychiatric: His mood appears anxious. Cognition and memory are not impaired.  Nursing note and vitals reviewed.    ED Treatments / Results   DIAGNOSTIC STUDIES: Oxygen Saturation is 100% on RA, normal by my interpretation.    COORDINATION OF CARE: 1:11 PM Discussed treatment plan with pt at bedside which includes labs and pt agreed to plan.   Labs (all labs ordered are listed, but only abnormal results are displayed) Labs Reviewed  CBC WITH DIFFERENTIAL/PLATELET  COMPREHENSIVE METABOLIC PANEL  LACTIC ACID, PLASMA  LACTIC ACID, PLASMA    EKG  EKG Interpretation  Date/Time:  Sunday January 25 2016 12:56:53 EST Ventricular Rate:  79 PR Interval:    QRS Duration: 101 QT Interval:  408 QTC Calculation: 468 R Axis:   92 Text Interpretation:  Sinus rhythm Borderline right axis deviation Artifact Borderline ECG Confirmed by Gerhard MunchLOCKWOOD, Namiko Pritts  MD [phone]([id number]) on 01/25/2016 1:55:34 PM        Procedures Procedures (including critical care time)  Medications Ordered in ED Medications  sodium chloride 0.9 % bolus 1,000 mL (1,000 mLs Intravenous New Bag/Given 01/25/16 1353)     Initial Impression / Assessment and Plan / ED Course  I have reviewed the triage vital signs and the nursing notes.  Pertinent labs & imaging results that were available during my care of the patient were reviewed by me and considered in my medical decision making (see chart for details).  Clinical Course     Patient in no distress on repeat exam. He is awake and alert, sitting upright We discussed all findings at length, and the importance of primary care follow-up.  This issue with baseline decreased mobility, chronic decubitus ulcer presents with multiple concerns, but on exam is awake and alert, hemodynamically stable.  Labs do not suggest acute new infection, physical exam also is reassuring. Patient discharged in stable condition with outpatient follow-up   I personally performed the services described in this documentation, which was scribed in my presence. The recorded information has been reviewed and is accurate.        Gerhard Munch, MD 01/25/16 [id number]

## 2016-01-25 NOTE — ED Triage Notes (Signed)
Pt reports he started running a fever yesterday. Pt states he has a sacral wound that has caused Sepsis in the past. Pt also has hx of UTIs. Pt states he "just isn't feeling well.

## 2016-01-25 NOTE — ED Triage Notes (Signed)
Pt states he is also feeling dizzy.

## 2016-01-25 NOTE — Discharge Instructions (Signed)
As discussed, your evaluation today has been largely reassuring.  But, it is important that you monitor your condition carefully, and do not hesitate to return to the ED if you develop new, or concerning changes in your condition. ? ?Otherwise, please follow-up with your physician for appropriate ongoing care. ? ?

## 2016-03-17 ENCOUNTER — Encounter (HOSPITAL_BASED_OUTPATIENT_CLINIC_OR_DEPARTMENT_OTHER): Payer: Medicare Other | Attending: Surgery

## 2016-03-23 ENCOUNTER — Emergency Department (HOSPITAL_COMMUNITY)
Admission: EM | Admit: 2016-03-23 | Discharge: 2016-03-24 | Disposition: A | Discharge status: Home / Self Care | Payer: Medicare Other | Attending: Emergency Medicine | Admitting: Emergency Medicine

## 2016-03-23 DIAGNOSIS — R1084 Generalized abdominal pain: Secondary | ICD-10-CM | POA: Diagnosis present

## 2016-03-23 DIAGNOSIS — K529 Noninfective gastroenteritis and colitis, unspecified: Secondary | ICD-10-CM

## 2016-03-23 DIAGNOSIS — Z87891 Personal history of nicotine dependence: Secondary | ICD-10-CM | POA: Insufficient documentation

## 2016-03-23 DIAGNOSIS — K5900 Constipation, unspecified: Secondary | ICD-10-CM | POA: Diagnosis not present

## 2016-03-24 ENCOUNTER — Emergency Department (HOSPITAL_COMMUNITY): Payer: Medicare Other

## 2016-03-24 ENCOUNTER — Encounter (HOSPITAL_COMMUNITY): Payer: Self-pay

## 2016-03-24 LAB — CBC WITH DIFFERENTIAL/PLATELET
Basophils Absolute: 0 10*3/uL (ref 0.0–0.1)
Basophils Relative: 0 %
Eosinophils Absolute: 0.1 10*3/uL (ref 0.0–0.7)
Eosinophils Relative: 1 %
HEMATOCRIT: 43.3 % (ref 39.0–52.0)
HEMOGLOBIN: 14.4 g/dL (ref 13.0–17.0)
LYMPHS ABS: 1.8 10*3/uL (ref 0.7–4.0)
Lymphocytes Relative: 16 %
MCH: 27.1 pg (ref 26.0–34.0)
MCHC: 33.3 g/dL (ref 30.0–36.0)
MCV: 81.4 fL (ref 78.0–100.0)
MONOS PCT: 8 %
Monocytes Absolute: 1 10*3/uL (ref 0.1–1.0)
NEUTROS ABS: 8.9 10*3/uL — AB (ref 1.7–7.7)
NEUTROS PCT: 75 %
Platelets: 291 10*3/uL (ref 150–400)
RBC: 5.32 MIL/uL (ref 4.22–5.81)
RDW: 13.3 % (ref 11.5–15.5)
WBC: 11.8 10*3/uL — AB (ref 4.0–10.5)

## 2016-03-24 LAB — URINALYSIS, ROUTINE W REFLEX MICROSCOPIC
Bilirubin Urine: NEGATIVE
Glucose, UA: NEGATIVE mg/dL
Ketones, ur: NEGATIVE mg/dL
Leukocytes, UA: NEGATIVE
NITRITE: NEGATIVE
PH: 5 (ref 5.0–8.0)
Protein, ur: NEGATIVE mg/dL
SPECIFIC GRAVITY, URINE: 1.012 (ref 1.005–1.030)
WBC UA: NONE SEEN WBC/hpf (ref 0–5)

## 2016-03-24 LAB — LIPASE, BLOOD: Lipase: 13 U/L (ref 11–51)

## 2016-03-24 LAB — COMPREHENSIVE METABOLIC PANEL
ALBUMIN: 4 g/dL (ref 3.5–5.0)
ALK PHOS: 103 U/L (ref 38–126)
ALT: 13 U/L — AB (ref 17–63)
ANION GAP: 9 (ref 5–15)
AST: 21 U/L (ref 15–41)
BILIRUBIN TOTAL: 0.4 mg/dL (ref 0.3–1.2)
BUN: 12 mg/dL (ref 6–20)
CALCIUM: 9.6 mg/dL (ref 8.9–10.3)
CO2: 28 mmol/L (ref 22–32)
CREATININE: 0.66 mg/dL (ref 0.61–1.24)
Chloride: 100 mmol/L — ABNORMAL LOW (ref 101–111)
GFR calc non Af Amer: >60 mL/min (ref >60)
GLUCOSE: 117 mg/dL — AB (ref 65–99)
Potassium: 4.3 mmol/L (ref 3.5–5.1)
SODIUM: 137 mmol/L (ref 135–145)
TOTAL PROTEIN: 8.9 g/dL — AB (ref 6.5–8.1)

## 2016-03-24 MED ORDER — POLYETHYLENE GLYCOL 3350 17 G PO PACK: 17.0000 g | PACK | Freq: Three times a day (TID) | ORAL | 0 refills | Status: DC | PRN

## 2016-03-24 MED ORDER — ONDANSETRON HCL 4 MG/2ML IJ SOLN
4.0000 mg | Freq: Once | INTRAMUSCULAR | Status: AC
  Administered 2016-03-24: 4 mg via INTRAVENOUS
  Filled 2016-03-24: qty 2

## 2016-03-24 MED ORDER — OMEPRAZOLE 20 MG PO CPDR: 20.0000 mg | DELAYED_RELEASE_CAPSULE | Freq: Every day | ORAL | 0 refills | Status: DC

## 2016-03-24 MED ORDER — MORPHINE SULFATE (PF) 4 MG/ML IV SOLN
4.0000 mg | Freq: Once | INTRAVENOUS | Status: AC
  Administered 2016-03-24: 4 mg via INTRAVENOUS
  Filled 2016-03-24: qty 1

## 2016-03-24 MED ORDER — ONDANSETRON 4 MG PO TBDP: 4.0000 mg | ORAL_TABLET | Freq: Three times a day (TID) | ORAL | 0 refills | Status: DC | PRN

## 2016-03-24 MED ORDER — IOPAMIDOL (ISOVUE-300) INJECTION 61%
100.0000 mL | Freq: Once | INTRAVENOUS | Status: AC | PRN
  Administered 2016-03-24: 100 mL via INTRAVENOUS

## 2016-03-24 NOTE — Discharge Instructions (Signed)
You were seen today for constipation and abdominal pain. This is likely a combination of constipation and into her right is which is inflammation of the bowel. This may be what is causing you to vomit. You should take MiraLAX 3 times daily until your stools are loose. Zofran and omeprazole will be provided for nausea and pain. Follow-up with her primary physician if symptoms do not improve. GI follow-up was also provided.

## 2016-03-24 NOTE — ED Notes (Signed)
Pt being transported to CT

## 2016-03-24 NOTE — ED Triage Notes (Signed)
Pt reports he has not had BM in over one 1 week and is now having abd pain and nausea.

## 2016-03-24 NOTE — ED Notes (Signed)
Pt screaming out very loudly, even with pt door closed his verbal yelling can be heard throughout nurses station and other pt rooms. This nurse walks into room and reassures pt he is going to be alright, pt screams, "I'm dying, I need my methadone,"  Informed pt that we did not have methadone in the ED. Repositioned pt and requested politely that he please lower his voice as it was disturbing to other patients.  Dr Wilkie AyeHorton made aware of this.

## 2016-03-24 NOTE — ED Notes (Signed)
Pt given a cup of water per request.  

## 2016-03-24 NOTE — ED Provider Notes (Signed)
[facility]-EMERG[facility]Y DEPT Provider Note   CSN: [id number] Arrival date & time: 03/23/16  2349  By signing my name below, I, Bobbie Stack, attest that this documentation has been prepared under the direction and in the presence of Shon Baton, MD. Electronically Signed: Bobbie Stack, Scribe. 03/24/16. 12:20 AM. History   Chief Complaint Chief Complaint  Patient presents with  . Constipation     HPI HPI Comments: [name] is a 56 y.o. male who presents to the Emergency Department complaining of constipation for the past 2 weeks. He also reports associated lower abdominal pain and nausea. Currently he rates pain at 10 out of 10. It is diffuse and nonradiating. He states that he has tried to take stool softeners and magnesium but they have provided no significant relief. He also states that he has been vomiting tonight. He reports having a similar problem in November. He states that he typically does not have any bowel problems. He denies SOB and fever. It is noted that the patient lives alone.  Patient is on chronic methadone. History of paraplegia. He self caths daily. Denies any fevers. Past Medical History:  Diagnosis Date  . Anemia    Patient states that he was recently told this  . Anxiety   . Depression   . Hepatitis C   . MRSA infection   . Osteomyelitis (HCC)   . Paralysis (HCC)   . 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 Abdominal pain 01/28/2015  . Hepatitis C 05/17/2013  . Closed supracondylar fracture of right femur (HCC) 09/25/2012    Class: Acute  . Right radial head fracture 07/05/2012  . Chronic hepatitis C (HCC) 05/10/2011  . Decubitus ulcer 05/10/2011  . Femur fracture, left (HCC) 06/30/2010  . Paraplegia (HCC) 06/30/2010  . CALCU BD WITHOUT MENTION CHOLECYST/OBSTRUCTION 05/22/2008  . CLOSED FRACTURE OF SHAFT OF TIBIA 03/22/2007    Past Surgical History:  Procedure Laterality Date  . BACK  SURGERY    . CHOLECYSTECTOMY    . FEMUR FRACTURE SURGERY  March 2012   retrograde intramedullary femoral nail  . GALLBLADDER SURGERY  2010  . HERNIA REPAIR    . ORIF FEMUR FRACTURE Right 08/04/2012   Procedure: OPEN REDUCTION INTERNAL FIXATION (ORIF) DISTAL FEMUR FRACTURE;  Surgeon: Eldred Manges, MD;  Location: [facility] OR;  Service: Orthopedics;  Laterality: Right;  Right Retrograde Supracondylar Nail Right Femur       Home Medications    Prior to Admission medications   Medication Sig Start Date End Date Taking? Authorizing Provider  methadone (DOLOPHINE) 10 MG tablet Take 20 mg by mouth 3 (three) times daily.    Historical Provider, MD  omeprazole (PRILOSEC) 20 MG capsule Take 1 capsule (20 mg total) by mouth daily. 03/24/16   Shon Baton, MD  ondansetron (ZOFRAN ODT) 4 MG disintegrating tablet Take 1 tablet (4 mg total) by mouth every 8 (eight) hours as needed for nausea or vomiting. 03/24/16   Shon Baton, MD  polyethylene glycol (MIRALAX) packet Take 17 g by mouth 3 (three) times daily as needed (Until stools are loose). 03/24/16   Shon Baton, MD  pregabalin (LYRICA) 75 MG capsule Take 75 mg by mouth 3 (three) times daily.     Historical Provider, MD    Family History Family History  Problem Relation Age of Onset  . Ovarian cancer Mother   . Healthy Sister   .  Hodgkin's lymphoma Sister   . Heart disease Sister   . Healthy Sister   . Healthy Son   . Healthy Daughter   . Anemia Daughter   . Thyroid disease Daughter   . Cancer      family history     Social History Social History  Substance Use Topics  . Smoking status: Former Smoker    Packs/day: 1.00    Years: 5.00    Types: Cigarettes    Quit date: 08/10/2010  . Smokeless tobacco: Never Used  . Alcohol use No     Comment: Patient quit drinking 1 year ago     Allergies   Patient has no known allergies.   Review of Systems Review of Systems  Constitutional: Positive for fever.  Respiratory:  Negative for shortness of breath.   Cardiovascular: Negative for chest pain.  Gastrointestinal: Positive for abdominal pain, nausea and vomiting.  Genitourinary: Negative for dysuria.  All other systems reviewed and are negative.    Physical Exam Updated Vital Signs BP 142/93   Pulse 83   Temp 98 F (36.7 C) (Oral)   Resp 17   Ht 5\' 9"  (1.753 m)   Wt 140 lb (63.5 kg)   SpO2 98%   BMI 20.67 kg/m   Physical Exam  Constitutional: He is oriented to person, place, and time. No distress.  Chronically ill-appearing, disheveled, no acute distress  HENT:  Head: Normocephalic and atraumatic.  Cardiovascular: Normal rate, regular rhythm and normal heart sounds.   Pulmonary/Chest: Effort normal and breath sounds normal. No respiratory distress. He has no wheezes.  Abdominal: Soft. Bowel sounds are normal. He exhibits no distension. There is no tenderness. There is no rebound.  Abdomen soft, normal bowel sounds, right upper quadrant scar noted  Genitourinary:  Genitourinary Comments: Decreased rectal tone, no fecal impaction noted, no gross blood  Musculoskeletal: He exhibits no edema.  Atrophy bilateral lower extremities  Neurological: He is alert and oriented to person, place, and time.  Paraplegia  Skin: Skin is warm and dry.  Psychiatric: He has a normal mood and affect.  Nursing note and vitals reviewed.    ED Treatments / Results  DIAGNOSTIC STUDIES: Oxygen Saturation is 97% on RA, normal by my interpretation.    COORDINATION OF CARE: 12:04 AM Discussed treatment plan with pt at bedside and pt agreed to plan.  Labs (all labs ordered are listed, but only abnormal results are displayed) Labs Reviewed  CBC WITH DIFFERENTIAL/PLATELET - Abnormal; Notable for the following:       Result Value   WBC 11.8 (*)    Neutro Abs 8.9 (*)    All other components within normal limits  COMPREHENSIVE METABOLIC PANEL - Abnormal; Notable for the following:    Chloride 100 (*)     Glucose, Bld 117 (*)    Total Protein 8.9 (*)    ALT 13 (*)    All other components within normal limits  URINALYSIS, ROUTINE W REFLEX MICROSCOPIC - Abnormal; Notable for the following:    Hgb urine dipstick SMALL (*)    Bacteria, UA RARE (*)    All other components within normal limits  LIPASE, BLOOD    EKG  EKG Interpretation None       Radiology Ct Abdomen Pelvis W Contrast  Result Date: 03/24/2016 CLINICAL DATA:  Abdominal pain. Nausea. Patient reports constipation for 2 weeks. Vomiting tonight. EXAM: CT ABDOMEN AND PELVIS WITH CONTRAST TECHNIQUE: Multidetector CT imaging of the abdomen and pelvis was  performed using the standard protocol following bolus administration of intravenous contrast. CONTRAST:  ISOVUE-300 IOPAMIDOL (ISOVUE-300) INJECTION 61% COMPARISON:  CT 01/28/2015.  Radiographs earlier this day. FINDINGS: Lower chest: No focal airspace disease. Normal heart size. No pleural fluid. Tiny blebs peripherally. Hepatobiliary: Stable extrahepatic biliary dilatation postcholecystectomy, common bile duct measures 11 mm mid distally. There is diminished central intrahepatic biliary ductal dilatation. No focal hepatic lesion. Pancreas: No ductal dilatation or inflammation. Spleen: Normal in size without focal abnormality. Adrenals/Urinary Tract: Normal adrenal glands. Symmetric renal enhancement and excretion. No hydronephrosis or perinephric edema. Partially duplicated left renal collecting system, left kidney is anteriorly rotated. The urinary bladder is completely decompressed and not well evaluated. Stomach/Bowel: No gastric wall thickening. Proximal small bowel loops are normal. Ileal loops in the right mid abdomen are fluid-filled, with mild fecalization of small bowel contents. Mild adjacent wall enhancement and mesenteric edema. Terminal ileum is normal. Moderate diffuse stool burden. No abnormal rectal distention. Normal appendix. Vascular/Lymphatic: Atherosclerosis of the  abdominal aorta and its branches. No aneurysm. Infrarenal IVC filter in place. No adenopathy. Reproductive: Prostate is unremarkable. Other: Mesenteric edema associated with ileal bowel loops. No intra- abdominal ascites. No free air. Musculoskeletal: Posterior fusion hardware of the thoracolumbar spine, partially included. Chronic thinning of the right inferior pubic ramus with tract extending to the skin, unchanged from prior exam and chronic. No evidence of acute bony destructive change or focal fluid collection. Fatty atrophy of gluteal musculature, in keeping with paraplegic state. IMPRESSION: 1. Enteritis/ileus pattern of ileal bowel loops in the right abdomen, enteritis is favored. No obstruction or perforation. 2. Postcholecystectomy with diminished biliary dilatation compared to prior exam. 3. Sequela of prior decubitus ulcer and osteomyelitis involving the right ischial tuberosity, unchanged in appearance. No active inflammation at this time. Electronically Signed   By: Rubye Oaks M.D.   On: 03/24/2016 02:55   Dg Abdomen Acute W/chest  Result Date: 03/24/2016 CLINICAL DATA:  Constipation and vomiting EXAM: DG ABDOMEN ACUTE W/ 1V CHEST COMPARISON:  03/18/2015 FINDINGS: AP upright chest demonstrates no acute infiltrate or effusion. Stable cardiomediastinal silhouette. Surgical hardware in the spine. Supine and upright views of the abdomen demonstrate no free air. Surgical clips in the right upper quadrant. IVC filter overlies L2 and L3. Nonobstructed bowel gas pattern with moderate stool. Partially visualized left femoral rod. IMPRESSION: 1. No acute abnormalities in the chest 2. Nonobstructed bowel gas pattern with moderate stool Electronically Signed   By: Jasmine Pang M.D.   On: 03/24/2016 01:06    Procedures Procedures (including critical care time)  Medications Ordered in ED Medications  morphine 4 MG/ML injection 4 mg (4 mg Intravenous Given 03/24/16 0203)  ondansetron (ZOFRAN)  injection 4 mg (4 mg Intravenous Given 03/24/16 0203)  iopamidol (ISOVUE-300) 61 % injection 100 mL (100 mLs Intravenous Contrast Given 03/24/16 0227)     Initial Impression / Assessment and Plan / ED Course  I have reviewed the triage vital signs and the nursing notes.  Pertinent labs & imaging results that were available during my care of the patient were reviewed by me and considered in my medical decision making (see chart for details).  Clinical Course     Patient presents with abdominal pain and constipation. Develop vomiting this evening. He is nontoxic. His abdominal exam is fairly benign without objective tenderness. Abdomen is soft. Lab work obtained as well as acute abdominal series. Mild leukocytosis but otherwise no significant abnormalities. He has a moderate stool burden on his abdominal  series. He does not appear impacted. On recheck, patient reports persistent pain. He was given pain medication. Will obtain an abdominal CT as his paraplegia and sometimes mask symptoms.  CT favors enteritis. Ileus is also on the differential. Patient has had no vomiting here. He is able to tolerate fluids. Favor aggressive bowel regimen at home. Patient is in agreement. MiraLAX 3 times a day recommended as well as Zofran and omeprazole for symptom control. If worsening, he needs to be reevaluated. GI follow-up also given.  After history, exam, and medical workup I feel the patient has been appropriately medically screened and is safe for discharge home. Pertinent diagnoses were discussed with the patient. Patient was given return precautions.   Final Clinical Impressions(s) / ED Diagnoses   Final diagnoses:  Enteritis  Constipation, unspecified constipation type    New Prescriptions Discharge Medication List as of 03/24/2016  3:19 AM    START taking these medications   Details  omeprazole (PRILOSEC) 20 MG capsule Take 1 capsule (20 mg total) by mouth daily., Starting Wed 03/24/2016, Print     ondansetron (ZOFRAN ODT) 4 MG disintegrating tablet Take 1 tablet (4 mg total) by mouth every 8 (eight) hours as needed for nausea or vomiting., Starting Wed 03/24/2016, Print    polyethylene glycol (MIRALAX) packet Take 17 g by mouth 3 (three) times daily as needed (Until stools are loose)., Starting Wed 03/24/2016, Print       I personally performed the services described in this documentation, which was scribed in my presence. The recorded information has been reviewed and is accurate.    Shon Batonourtney F Tamella Tuccillo, MD 03/24/16 [phone]

## 2016-03-24 NOTE — ED Notes (Addendum)
Pt has dilated pupils, shaky, goose like flesh, yawning, vomiting with nausea and tachycardic - pt appears to be in opiate withdrawal. Dr Wilkie AyeHorton aware- new orders for morphine received.

## 2016-06-01 ENCOUNTER — Ambulatory Visit (INDEPENDENT_AMBULATORY_CARE_PROVIDER_SITE_OTHER): Payer: Medicare Other | Admitting: Internal Medicine

## 2016-06-01 ENCOUNTER — Telehealth (INDEPENDENT_AMBULATORY_CARE_PROVIDER_SITE_OTHER): Payer: Self-pay | Admitting: *Deleted

## 2016-06-01 ENCOUNTER — Encounter (INDEPENDENT_AMBULATORY_CARE_PROVIDER_SITE_OTHER): Payer: Self-pay | Admitting: Internal Medicine

## 2016-06-01 ENCOUNTER — Encounter (INDEPENDENT_AMBULATORY_CARE_PROVIDER_SITE_OTHER): Payer: Self-pay

## 2016-06-01 ENCOUNTER — Encounter (INDEPENDENT_AMBULATORY_CARE_PROVIDER_SITE_OTHER): Payer: Self-pay | Admitting: *Deleted

## 2016-06-01 ENCOUNTER — Other Ambulatory Visit (INDEPENDENT_AMBULATORY_CARE_PROVIDER_SITE_OTHER): Payer: Self-pay | Admitting: Internal Medicine

## 2016-06-01 VITALS — BP 112/84 | HR 80 | Temp 97.6°F | Ht 69.0 in | Wt 140.0 lb

## 2016-06-01 DIAGNOSIS — K625 Hemorrhage of anus and rectum: Secondary | ICD-10-CM

## 2016-06-01 NOTE — Patient Instructions (Signed)
Colonoscopy.  The risks and benefits such as perforation, bleeding, and infection were reviewed with the patient and is agreeable. 

## 2016-06-01 NOTE — Progress Notes (Signed)
Subjective:    Patient ID: [name], male    DOB: Dec 22, 1960, 56 y.o.   MRN: [id number]  HPI Here today with c/o rectal bleeding. Also states there has been a change in his stools.   Seen in the ED in November for fever, abdominal pain, nausea and vomiting, constipation. He was started on Miralax. Stool was negative for blood. There was no fecal impaction.  On chronic methadone Hx of paraplegia States he is taking Relistor for his constipation. Started about a month ago. He says with this medication, his stools are very loose.  Before this, he took, dulcolax for his constipation. Up until a month ago, he had no problem with constipation. He says he see blood when he wipes after a BM. His BMs are brown in color.  Appetite is good. No weight loss.  Successfully treated for Hepatitis C in 2015. Genotype 1B.  02/06/2015  Hep C quaint  Undetected.  He says he has a pressure ulcer. He has never had a colonoscopy in the past.   1/17/2-018 CT abdomen/pelvis with CM: nausea, constipation:   IMPRESSION: 1. Enteritis/ileus pattern of ileal bowel loops in the right abdomen, enteritis is favored. No obstruction or perforation. 2. Postcholecystectomy with diminished biliary dilatation compared to prior exam. 3. Sequela of prior decubitus ulcer and osteomyelitis involving the right ischial tuberosity, unchanged in appearance. No active inflammation at this time.  CBC    Component Value Date/Time   WBC 11.8 (H) 03/24/2016 0037   RBC 5.32 03/24/2016 0037   HGB 14.4 03/24/2016 0037   HCT 43.3 03/24/2016 0037   PLT 291 03/24/2016 0037   MCV 81.4 03/24/2016 0037   MCH 27.1 03/24/2016 0037   MCHC 33.3 03/24/2016 0037   RDW 13.3 03/24/2016 0037   LYMPHSABS 1.8 03/24/2016 0037   MONOABS 1.0 03/24/2016 0037   EOSABS 0.1 03/24/2016 0037   BASOSABS 0.0 03/24/2016 0037     Review of Systems Past Medical History:  Diagnosis Date  . Anemia    Patient states that he was recently told  this  . Anxiety   . Depression   . Hepatitis C   . MRSA infection   . Osteomyelitis (HCC)   . Paralysis (HCC)   . Paraparesis (HCC)    parapalegic     Past Surgical History:  Procedure Laterality Date  . BACK SURGERY    . CHOLECYSTECTOMY    . FEMUR FRACTURE SURGERY  March 2012   retrograde intramedullary femoral nail  . GALLBLADDER SURGERY  2010  . HERNIA REPAIR    . ORIF FEMUR FRACTURE Right 08/04/2012   Procedure: OPEN REDUCTION INTERNAL FIXATION (ORIF) DISTAL FEMUR FRACTURE;  Surgeon: Eldred Manges, MD;  Location: [facility] OR;  Service: Orthopedics;  Laterality: Right;  Right Retrograde Supracondylar Nail Right Femur    No Known Allergies  Current Outpatient Prescriptions on File Prior to Visit  Medication Sig Dispense Refill  . methadone (DOLOPHINE) 10 MG tablet Take 20 mg by mouth 3 (three) times daily.    [name] omeprazole (PRILOSEC) 20 MG capsule Take 1 capsule (20 mg total) by mouth daily. (Patient taking differently: Take 20 mg by mouth as needed. ) 30 capsule 0  . pregabalin (LYRICA) 75 MG capsule Take 75 mg by mouth 3 (three) times daily.      No current facility-administered medications on file prior to visit.        Objective:   Physical Exam Blood pressure 112/84, pulse 80, temperature 97.6 F (36.4  C), height 5\' 9"  (1.753 m), weight 140 lb (63.5 kg). Alert and oriented. Skin warm and dry. Oral mucosa is moist.   . Sclera anicteric, conjunctivae is pink. Thyroid not enlarged. No cervical lymphadenopathy. Lungs clear. Heart regular rate and rhythm.  Abdomen is soft. Bowel sounds are positive. No hepatomegaly. No abdominal masses felt. No tenderness.   .   Stool brown and guaiac negative. Pressure ulcer to left buttock.       Assessment & Plan:  Narcotic induced Constipation: Continue the Relistor. Stool brown and guaiac negative. Hemoglobin is normal. Hx of rectal bleeding. Needs a colonoscopy to rule out colon cancer, polyp, hemorrhoid.  The risks and benefits such as  perforation, bleeding, and infection were reviewed with the patient and is agreeable.

## 2016-06-01 NOTE — Telephone Encounter (Signed)
Patient needs trilyte 

## 2016-06-02 MED ORDER — PEG 3350-KCL-NA BICARB-NACL 420 G PO SOLR: 4000.0000 mL | Freq: Once | ORAL | 0 refills | Status: AC

## 2016-06-11 NOTE — Patient Instructions (Signed)
[name]  06/11/2016     @   Your procedure is scheduled on  06/18/2016   Report to [facility] at  920  A.M.  Call this number if you have problems the morning of surgery:  [phone]   Remember:  Do not eat food or drink liquids after midnight.  Take these medicines the morning of surgery with A SIP OF WATER  Methadone, prilosec, lyrica.   Do not wear jewelry, make-up or nail polish.  Do not wear lotions, powders, or perfumes, or deoderant.  Do not shave 48 hours prior to surgery.  Men may shave face and neck.  Do not bring valuables to the hospital.  [facility] is not responsible for any belongings or valuables.  Contacts, dentures or bridgework may not be worn into surgery.  Leave your suitcase in the car.  After surgery it may be brought to your room.  For patients admitted to the hospital, discharge time will be determined by your treatment team.  Patients discharged the day of surgery will not be allowed to drive home.   Name and phone number of your driver:   family Special instructions:  Follow the diet and prep instructions given to you by Dr Patty Sermons office.  Please read over the following fact sheets that you were given. Anesthesia Post-op Instructions and Care and Recovery After Surgery       Colonoscopy, Adult A colonoscopy is an exam to look at the entire large intestine. During the exam, a lubricated, bendable tube is inserted into the anus and then passed into the rectum, colon, and other parts of the large intestine. A colonoscopy is often done as a part of normal colorectal screening or in response to certain symptoms, such as anemia, persistent diarrhea, abdominal pain, and blood in the stool. The exam can help screen for and diagnose medical problems, including:  Tumors.  Polyps.  Inflammation.  Areas of bleeding. Tell a health care provider about:  Any allergies you have.  All medicines you are taking,  including vitamins, herbs, eye drops, creams, and over-the-counter medicines.  Any problems you or family members have had with anesthetic medicines.  Any blood disorders you have.  Any surgeries you have had.  Any medical conditions you have.  Any problems you have had passing stool. What are the risks? Generally, this is a safe procedure. However, problems may occur, including:  Bleeding.  A tear in the intestine.  A reaction to medicines given during the exam.  Infection (rare). What happens before the procedure? Eating and drinking restrictions  Follow instructions from your health care provider about eating and drinking, which may include:  A few days before the procedure - follow a low-fiber diet. Avoid nuts, seeds, dried fruit, raw fruits, and vegetables.  1-3 days before the procedure - follow a clear liquid diet. Drink only clear liquids, such as clear broth or bouillon, black coffee or tea, clear juice, clear soft drinks or sports drinks, gelatin dessert, and popsicles. Avoid any liquids that contain red or purple dye.  On the day of the procedure - do not eat or drink anything during the 2 hours before the procedure, or within the time period that your health care provider recommends. Bowel prep  If you were prescribed an oral bowel prep to clean out your colon:  Take it as told by your health care provider. Starting the day before your procedure, you will  need to drink a large amount of medicated liquid. The liquid will cause you to have multiple loose stools until your stool is almost clear or light green.  If your skin or anus gets irritated from diarrhea, you may use these to relieve the irritation:  Medicated wipes, such as adult wet wipes with aloe and vitamin E.  A skin soothing-product like petroleum jelly.  If you vomit while drinking the bowel prep, take a break for up to 60 minutes and then begin the bowel prep again. If vomiting continues and you cannot  take the bowel prep without vomiting, call your health care provider. General instructions   Ask your health care provider about changing or stopping your regular medicines. This is especially important if you are taking diabetes medicines or blood thinners.  Plan to have someone take you home from the hospital or clinic. What happens during the procedure?  An IV tube may be inserted into one of your veins.  You will be given medicine to help you relax (sedative).  To reduce your risk of infection:  Your health care team will wash or sanitize their hands.  Your anal area will be washed with soap.  You will be asked to lie on your side with your knees bent.  Your health care provider will lubricate a long, thin, flexible tube. The tube will have a camera and a light on the end.  The tube will be inserted into your anus.  The tube will be gently eased through your rectum and colon.  Air will be delivered into your colon to keep it open. You may feel some pressure or cramping.  The camera will be used to take images during the procedure.  A small tissue sample may be removed from your body to be examined under a microscope (biopsy). If any potential problems are found, the tissue will be sent to a lab for testing.  If small polyps are found, your health care provider may remove them and have them checked for cancer cells.  The tube that was inserted into your anus will be slowly removed. The procedure may vary among health care providers and hospitals. What happens after the procedure?  Your blood pressure, heart rate, breathing rate, and blood oxygen level will be monitored until the medicines you were given have worn off.  Do not drive for 24 hours after the exam.  You may have a small amount of blood in your stool.  You may pass gas and have mild abdominal cramping or bloating due to the air that was used to inflate your colon during the exam.  It is up to you to get the  results of your procedure. Ask your health care provider, or the department performing the procedure, when your results will be ready. This information is not intended to replace advice given to you by your health care provider. Make sure you discuss any questions you have with your health care provider. Document Released: 02/20/2000 Document Revised: 12/24/2015 Document Reviewed: 05/06/2015 Elsevier Interactive Patient Education  2017 [employer]  Colonoscopy, Adult, Care After This sheet gives you information about how to care for yourself after your procedure. Your health care provider may also give you more specific instructions. If you have problems or questions, contact your health care provider. What can I expect after the procedure? After the procedure, it is common to have:  A small amount of blood in your stool for 24 hours after the procedure.  Some gas.  Mild abdominal cramping or bloating. Follow these instructions at home: General instructions    For the first 24 hours after the procedure:  Do not drive or use machinery.  Do not sign important documents.  Do not drink alcohol.  Do your regular daily activities at a slower pace than normal.  Eat soft, easy-to-digest foods.  Rest often.  Take over-the-counter or prescription medicines only as told by your health care provider.  It is up to you to get the results of your procedure. Ask your health care provider, or the department performing the procedure, when your results will be ready. Relieving cramping and bloating   Try walking around when you have cramps or feel bloated.  Apply heat to your abdomen as told by your health care provider. Use a heat source that your health care provider recommends, such as a moist heat pack or a heating pad.  Place a towel between your skin and the heat source.  Leave the heat on for 20-30 minutes.  Remove the heat if your skin turns bright red. This is especially  important if you are unable to feel pain, heat, or cold. You may have a greater risk of getting burned. Eating and drinking   Drink enough fluid to keep your urine clear or pale yellow.  Resume your normal diet as instructed by your health care provider. Avoid heavy or fried foods that are hard to digest.  Avoid drinking alcohol for as long as instructed by your health care provider. Contact a health care provider if:  You have blood in your stool 2-3 days after the procedure. Get help right away if:  You have more than a small spotting of blood in your stool.  You pass large blood clots in your stool.  Your abdomen is swollen.  You have nausea or vomiting.  You have a fever.  You have increasing abdominal pain that is not relieved with medicine. This information is not intended to replace advice given to you by your health care provider. Make sure you discuss any questions you have with your health care provider. Document Released: 10/07/2003 Document Revised: 11/17/2015 Document Reviewed: 05/06/2015 Elsevier Interactive Patient Education  2017 [place] Anesthesia is a term that refers to techniques, procedures, and medicines that help a person stay safe and comfortable during a medical procedure. Monitored anesthesia care, or sedation, is one type of anesthesia. Your anesthesia specialist may recommend sedation if you will be having a procedure that does not require you to be unconscious, such as:  Cataract surgery.  A dental procedure.  A biopsy.  A colonoscopy. During the procedure, you may receive a medicine to help you relax (sedative). There are three levels of sedation:  Mild sedation. At this level, you may feel awake and relaxed. You will be able to follow directions.  Moderate sedation. At this level, you will be sleepy. You may not remember the procedure.  Deep sedation. At this level, you will be asleep. You will not remember  the procedure. The more medicine you are given, the deeper your level of sedation will be. Depending on how you respond to the procedure, the anesthesia specialist may change your level of sedation or the type of anesthesia to fit your needs. An anesthesia specialist will monitor you closely during the procedure. Let your health care provider know about:  Any allergies you have.  All medicines you are taking, including vitamins, herbs, eye drops, creams, and over-the-counter medicines.  Any use of steroids (by mouth or as a cream).  Any problems you or family members have had with sedatives and anesthetic medicines.  Any blood disorders you have.  Any surgeries you have had.  Any medical conditions you have, such as sleep apnea.  Whether you are pregnant or may be pregnant.  Any use of cigarettes, alcohol, or street drugs. What are the risks? Generally, this is a safe procedure. However, problems may occur, including:  Getting too much medicine (oversedation).  Nausea.  Allergic reaction to medicines.  Trouble breathing. If this happens, a breathing tube may be used to help with breathing. It will be removed when you are awake and breathing on your own.  Heart trouble.  Lung trouble. Before the procedure Staying hydrated  Follow instructions from your health care provider about hydration, which may include:  Up to 2 hours before the procedure - you may continue to drink clear liquids, such as water, clear fruit juice, black coffee, and plain tea. Eating and drinking restrictions  Follow instructions from your health care provider about eating and drinking, which may include:  8 hours before the procedure - stop eating heavy meals or foods such as meat, fried foods, or fatty foods.  6 hours before the procedure - stop eating light meals or foods, such as toast or cereal.  6 hours before the procedure - stop drinking milk or drinks that contain milk.  2 hours before the  procedure - stop drinking clear liquids. Medicines  Ask your health care provider about:  Changing or stopping your regular medicines. This is especially important if you are taking diabetes medicines or blood thinners.  Taking medicines such as aspirin and ibuprofen. These medicines can thin your blood. Do not take these medicines before your procedure if your health care provider instructs you not to. Tests and exams  You will have a physical exam.  You may have blood tests done to show:  How well your kidneys and liver are working.  How well your blood can clot.  General instructions  Plan to have someone take you home from the hospital or clinic.  If you will be going home right after the procedure, plan to have someone with you for 24 hours. What happens during the procedure?  Your blood pressure, heart rate, breathing, level of pain and overall condition will be monitored.  An IV tube will be inserted into one of your veins.  Your anesthesia specialist will give you medicines as needed to keep you comfortable during the procedure. This may mean changing the level of sedation.  The procedure will be performed. After the procedure  Your blood pressure, heart rate, breathing rate, and blood oxygen level will be monitored until the medicines you were given have worn off.  Do not drive for 24 hours if you received a sedative.  You may:  Feel sleepy, clumsy, or nauseous.  Feel forgetful about what happened after the procedure.  Have a sore throat if you had a breathing tube during the procedure.  Vomit. This information is not intended to replace advice given to you by your health care provider. Make sure you discuss any questions you have with your health care provider. Document Released: 11/18/2004 Document Revised: 08/01/2015 Document Reviewed: 06/15/2015 Elsevier Interactive Patient Education  2017 [place], Care After These  instructions provide you with information about caring for yourself after your procedure. Your health care provider may also give you more specific instructions.  Your treatment has been planned according to current medical practices, but problems sometimes occur. Call your health care provider if you have any problems or questions after your procedure. What can I expect after the procedure? After your procedure, it is common to:  Feel sleepy for several hours.  Feel clumsy and have poor balance for several hours.  Feel forgetful about what happened after the procedure.  Have poor judgment for several hours.  Feel nauseous or vomit.  Have a sore throat if you had a breathing tube during the procedure. Follow these instructions at home: For at least 24 hours after the procedure:    Do not:  Participate in activities in which you could fall or become injured.  Drive.  Use heavy machinery.  Drink alcohol.  Take sleeping pills or medicines that cause drowsiness.  Make important decisions or sign legal documents.  Take care of children on your own.  Rest. Eating and drinking   Follow the diet that is recommended by your health care provider.  If you vomit, drink water, juice, or soup when you can drink without vomiting.  Make sure you have little or no nausea before eating solid foods. General instructions   Have a responsible adult stay with you until you are awake and alert.  Take over-the-counter and prescription medicines only as told by your health care provider.  If you smoke, do not smoke without supervision.  Keep all follow-up visits as told by your health care provider. This is important. Contact a health care provider if:  You keep feeling nauseous or you keep vomiting.  You feel light-headed.  You develop a rash.  You have a fever. Get help right away if:  You have trouble breathing. This information is not intended to replace advice given to you  by your health care provider. Make sure you discuss any questions you have with your health care provider. Document Released: 06/15/2015 Document Revised: 10/15/2015 Document Reviewed: 06/15/2015 Elsevier Interactive Patient Education  2017 [employer].

## 2016-06-14 ENCOUNTER — Encounter (HOSPITAL_COMMUNITY)
Admission: RE | Admit: 2016-06-14 | Discharge: 2016-06-14 | Disposition: A | Discharge status: Home / Self Care | Payer: Medicare Other | Source: Ambulatory Visit | Attending: Internal Medicine | Admitting: Internal Medicine

## 2016-06-14 ENCOUNTER — Encounter (HOSPITAL_COMMUNITY): Payer: Self-pay

## 2016-06-14 DIAGNOSIS — Z01818 Encounter for other preprocedural examination: Secondary | ICD-10-CM | POA: Diagnosis not present

## 2016-06-14 DIAGNOSIS — K625 Hemorrhage of anus and rectum: Secondary | ICD-10-CM | POA: Diagnosis not present

## 2016-06-14 HISTORY — DX: Gastro-esophageal reflux disease without esophagitis: K21.9

## 2016-06-14 HISTORY — DX: Pressure ulcer of right buttock, unspecified stage: L89.319

## 2016-06-14 LAB — CBC WITH DIFFERENTIAL/PLATELET
BASOS ABS: 0 10*3/uL (ref 0.0–0.1)
BASOS PCT: 0 %
EOS ABS: 0.1 10*3/uL (ref 0.0–0.7)
EOS PCT: 2 %
HCT: 36.4 % — ABNORMAL LOW (ref 39.0–52.0)
Hemoglobin: 12.1 g/dL — ABNORMAL LOW (ref 13.0–17.0)
LYMPHS PCT: 30 %
Lymphs Abs: 1.3 10*3/uL (ref 0.7–4.0)
MCH: 26.9 pg (ref 26.0–34.0)
MCHC: 33.2 g/dL (ref 30.0–36.0)
MCV: 81.1 fL (ref 78.0–100.0)
Monocytes Absolute: 0.4 10*3/uL (ref 0.1–1.0)
Monocytes Relative: 10 %
Neutro Abs: 2.4 10*3/uL (ref 1.7–7.7)
Neutrophils Relative %: 58 %
PLATELETS: 274 10*3/uL (ref 150–400)
RBC: 4.49 MIL/uL (ref 4.22–5.81)
RDW: 14.3 % (ref 11.5–15.5)
WBC: 4.1 10*3/uL (ref 4.0–10.5)

## 2016-06-14 LAB — BASIC METABOLIC PANEL
ANION GAP: 9 (ref 5–15)
BUN: 8 mg/dL (ref 6–20)
CALCIUM: 8.8 mg/dL — AB (ref 8.9–10.3)
CO2: 28 mmol/L (ref 22–32)
Chloride: 100 mmol/L — ABNORMAL LOW (ref 101–111)
Creatinine, Ser: 0.6 mg/dL — ABNORMAL LOW (ref 0.61–1.24)
Glucose, Bld: 153 mg/dL — ABNORMAL HIGH (ref 65–99)
Potassium: 3.8 mmol/L (ref 3.5–5.1)
SODIUM: 137 mmol/L (ref 135–145)

## 2016-06-16 ENCOUNTER — Telehealth (INDEPENDENT_AMBULATORY_CARE_PROVIDER_SITE_OTHER): Payer: Self-pay | Admitting: *Deleted

## 2016-06-16 NOTE — Telephone Encounter (Signed)
[name] for pre-op had called [name] about the issue that [name] did not have anyone that could be with him and possibly there could be an issue with prepping.  I called [name] and he said he did not have anyone that could help him out with a ride to and from and be there with him afterwards.  [name] and myself started working on trying to find him some help. [facility]/Aging for disabilities.  Holiday representative, caregiver(no longer in business) Child psychotherapist at [employer] is no one to help patients with this issue.  Unfortunately there is no help for disabled in this county to help him out.  I called [name] and we discussed and she suggested that we admitted for observation afterwards and he could then take himself home.  I feel we have exhausted all options elsewhere at this time and this may the only solution.  Please advise so I can call patient back.   [phone]

## 2016-06-16 NOTE — Telephone Encounter (Signed)
Per Dr.Rehman- Call patient in the morning. If he still does not have someone to be with him and drive him. The procedure will be canceled.

## 2016-06-16 NOTE — Pre-Procedure Instructions (Signed)
Patient in for PAT. He is a paraplegic. He states he has no friends and no family. He plans on driving his hoover round home by himself after his procedure. Spoke with him at great length about policy of inability to let him go home by himself or drive his hoover round himself  As well as  the safety issues this can cause after MAC anesthesia. States "there is no one who will help me. They look at me and walk away. I have no one." Spoke with Dr Laural Golden, he also states we cannot do patient's procedure if there will be no one with him. He asked me to call Lelon Frohlich at his office and have her try and work on this issue for patient. I called her and explained situation to her so she can work on this issue.

## 2016-06-17 NOTE — Telephone Encounter (Signed)
Spoke with patient.  Need to cancel for now.  He thinks he can get his son that is coming back in to this area can help next few weeks.  He will call us.  Ann please cancel at [facility]

## 2016-06-17 NOTE — Telephone Encounter (Signed)
TCS canceled 

## 2016-06-17 NOTE — Telephone Encounter (Signed)
Messages have been left for his call back this morning.  If someone should get this call Dewayne Hatch or I need him please

## 2016-06-18 ENCOUNTER — Encounter (HOSPITAL_COMMUNITY): Admission: RE | Payer: Self-pay | Source: Ambulatory Visit

## 2016-06-18 ENCOUNTER — Ambulatory Visit (HOSPITAL_COMMUNITY): Admission: RE | Admit: 2016-06-18 | Payer: Medicare Other | Source: Ambulatory Visit | Admitting: Internal Medicine

## 2016-06-18 SURGERY — COLONOSCOPY WITH PROPOFOL
Anesthesia: Monitor Anesthesia Care

## 2016-08-10 ENCOUNTER — Emergency Department (HOSPITAL_COMMUNITY)
Admission: EM | Admit: 2016-08-10 | Discharge: 2016-08-10 | Disposition: A | Discharge status: Home / Self Care | Payer: Medicare Other | Source: Home / Self Care

## 2016-08-10 ENCOUNTER — Observation Stay (HOSPITAL_COMMUNITY)
Admission: EM | Admit: 2016-08-10 | Discharge: 2016-08-12 | Disposition: A | Discharge status: Home / Self Care | Payer: Medicare Other | Attending: Family Medicine | Admitting: Family Medicine

## 2016-08-10 ENCOUNTER — Emergency Department (HOSPITAL_COMMUNITY): Payer: Medicare Other

## 2016-08-10 ENCOUNTER — Encounter (HOSPITAL_COMMUNITY): Payer: Self-pay

## 2016-08-10 ENCOUNTER — Encounter (HOSPITAL_COMMUNITY): Payer: Self-pay | Admitting: Emergency Medicine

## 2016-08-10 DIAGNOSIS — Z9049 Acquired absence of other specified parts of digestive tract: Secondary | ICD-10-CM | POA: Insufficient documentation

## 2016-08-10 DIAGNOSIS — F419 Anxiety disorder, unspecified: Secondary | ICD-10-CM | POA: Diagnosis not present

## 2016-08-10 DIAGNOSIS — T403X5A Adverse effect of methadone, initial encounter: Secondary | ICD-10-CM | POA: Diagnosis not present

## 2016-08-10 DIAGNOSIS — Z87891 Personal history of nicotine dependence: Secondary | ICD-10-CM | POA: Diagnosis not present

## 2016-08-10 DIAGNOSIS — R740 Nonspecific elevation of levels of transaminase and lactic acid dehydrogenase [LDH]: Secondary | ICD-10-CM

## 2016-08-10 DIAGNOSIS — R109 Unspecified abdominal pain: Secondary | ICD-10-CM | POA: Insufficient documentation

## 2016-08-10 DIAGNOSIS — R748 Abnormal levels of other serum enzymes: Secondary | ICD-10-CM | POA: Diagnosis not present

## 2016-08-10 DIAGNOSIS — Z8614 Personal history of Methicillin resistant Staphylococcus aureus infection: Secondary | ICD-10-CM | POA: Insufficient documentation

## 2016-08-10 DIAGNOSIS — S52121A Displaced fracture of head of right radius, initial encounter for closed fracture: Secondary | ICD-10-CM

## 2016-08-10 DIAGNOSIS — K5903 Drug induced constipation: Secondary | ICD-10-CM | POA: Insufficient documentation

## 2016-08-10 DIAGNOSIS — G8220 Paraplegia, unspecified: Secondary | ICD-10-CM | POA: Diagnosis not present

## 2016-08-10 DIAGNOSIS — F329 Major depressive disorder, single episode, unspecified: Secondary | ICD-10-CM | POA: Diagnosis not present

## 2016-08-10 DIAGNOSIS — B182 Chronic viral hepatitis C: Secondary | ICD-10-CM | POA: Diagnosis not present

## 2016-08-10 DIAGNOSIS — R1013 Epigastric pain: Principal | ICD-10-CM | POA: Insufficient documentation

## 2016-08-10 DIAGNOSIS — R945 Abnormal results of liver function studies: Secondary | ICD-10-CM

## 2016-08-10 DIAGNOSIS — K838 Other specified diseases of biliary tract: Secondary | ICD-10-CM | POA: Insufficient documentation

## 2016-08-10 DIAGNOSIS — R143 Flatulence: Secondary | ICD-10-CM | POA: Diagnosis not present

## 2016-08-10 DIAGNOSIS — M160 Bilateral primary osteoarthritis of hip: Secondary | ICD-10-CM | POA: Diagnosis not present

## 2016-08-10 DIAGNOSIS — Z5321 Procedure and treatment not carried out due to patient leaving prior to being seen by health care provider: Secondary | ICD-10-CM

## 2016-08-10 DIAGNOSIS — R1010 Upper abdominal pain, unspecified: Secondary | ICD-10-CM

## 2016-08-10 DIAGNOSIS — Z79899 Other long term (current) drug therapy: Secondary | ICD-10-CM | POA: Diagnosis not present

## 2016-08-10 DIAGNOSIS — R7401 Elevation of levels of liver transaminase levels: Secondary | ICD-10-CM

## 2016-08-10 DIAGNOSIS — R7989 Other specified abnormal findings of blood chemistry: Secondary | ICD-10-CM

## 2016-08-10 DIAGNOSIS — R1011 Right upper quadrant pain: Secondary | ICD-10-CM

## 2016-08-10 DIAGNOSIS — I700 Atherosclerosis of aorta: Secondary | ICD-10-CM | POA: Insufficient documentation

## 2016-08-10 DIAGNOSIS — K219 Gastro-esophageal reflux disease without esophagitis: Secondary | ICD-10-CM | POA: Diagnosis not present

## 2016-08-10 DIAGNOSIS — K8042 Calculus of bile duct with acute cholecystitis without obstruction: Secondary | ICD-10-CM

## 2016-08-10 LAB — CBC WITH DIFFERENTIAL/PLATELET
BASOS ABS: 0 10*3/uL (ref 0.0–0.1)
BASOS PCT: 0 %
EOS ABS: 0 10*3/uL (ref 0.0–0.7)
Eosinophils Relative: 0 %
HEMATOCRIT: 39.2 % (ref 39.0–52.0)
HEMOGLOBIN: 13 g/dL (ref 13.0–17.0)
Lymphocytes Relative: 4 %
Lymphs Abs: 0.4 10*3/uL — ABNORMAL LOW (ref 0.7–4.0)
MCH: 26.6 pg (ref 26.0–34.0)
MCHC: 33.2 g/dL (ref 30.0–36.0)
MCV: 80.3 fL (ref 78.0–100.0)
Monocytes Absolute: 0.5 10*3/uL (ref 0.1–1.0)
Monocytes Relative: 5 %
NEUTROS ABS: 9 10*3/uL — AB (ref 1.7–7.7)
NEUTROS PCT: 91 %
Platelets: 285 10*3/uL (ref 150–400)
RBC: 4.88 MIL/uL (ref 4.22–5.81)
RDW: 14 % (ref 11.5–15.5)
WBC: 9.9 10*3/uL (ref 4.0–10.5)

## 2016-08-10 LAB — COMPREHENSIVE METABOLIC PANEL
ALBUMIN: 3.8 g/dL (ref 3.5–5.0)
ALK PHOS: 244 U/L — AB (ref 38–126)
ALT: 181 U/L — AB (ref 17–63)
AST: 407 U/L — AB (ref 15–41)
Anion gap: 9 (ref 5–15)
BILIRUBIN TOTAL: 1.5 mg/dL — AB (ref 0.3–1.2)
BUN: 10 mg/dL (ref 6–20)
CO2: 26 mmol/L (ref 22–32)
Calcium: 8.9 mg/dL (ref 8.9–10.3)
Chloride: 102 mmol/L (ref 101–111)
Creatinine, Ser: 0.55 mg/dL — ABNORMAL LOW (ref 0.61–1.24)
GFR calc Af Amer: >60 mL/min (ref >60)
GFR calc non Af Amer: >60 mL/min (ref >60)
Glucose, Bld: 189 mg/dL — ABNORMAL HIGH (ref 65–99)
Potassium: 3.8 mmol/L (ref 3.5–5.1)
SODIUM: 137 mmol/L (ref 135–145)
TOTAL PROTEIN: 8.4 g/dL — AB (ref 6.5–8.1)

## 2016-08-10 LAB — URINALYSIS, ROUTINE W REFLEX MICROSCOPIC
BILIRUBIN URINE: NEGATIVE
Glucose, UA: 50 mg/dL — AB
HGB URINE DIPSTICK: NEGATIVE
Ketones, ur: NEGATIVE mg/dL
Leukocytes, UA: NEGATIVE
Nitrite: NEGATIVE
PH: 6 (ref 5.0–8.0)
Protein, ur: NEGATIVE mg/dL
SPECIFIC GRAVITY, URINE: 1.014 (ref 1.005–1.030)

## 2016-08-10 LAB — LIPASE, BLOOD: Lipase: 17 U/L (ref 11–51)

## 2016-08-10 LAB — ACETAMINOPHEN LEVEL: Acetaminophen (Tylenol), Serum: <10 ug/mL — ABNORMAL LOW (ref 10–30)

## 2016-08-10 MED ORDER — SODIUM CHLORIDE 0.9 % IV BOLUS (SEPSIS)
1000.0000 mL | Freq: Once | INTRAVENOUS | Status: AC
  Administered 2016-08-10: 1000 mL via INTRAVENOUS

## 2016-08-10 MED ORDER — MORPHINE SULFATE (PF) 4 MG/ML IV SOLN
4.0000 mg | Freq: Once | INTRAVENOUS | Status: AC
  Administered 2016-08-10: 4 mg via INTRAVENOUS
  Filled 2016-08-10: qty 1

## 2016-08-10 MED ORDER — HYDROMORPHONE HCL 1 MG/ML IJ SOLN
1.0000 mg | Freq: Once | INTRAMUSCULAR | Status: AC
Start: 2016-08-11 — End: 2016-08-11
  Administered 2016-08-11: 1 mg via INTRAVENOUS
  Filled 2016-08-10: qty 1

## 2016-08-10 MED ORDER — ONDANSETRON HCL 4 MG/2ML IJ SOLN
4.0000 mg | Freq: Once | INTRAMUSCULAR | Status: AC
  Administered 2016-08-10: 4 mg via INTRAVENOUS
  Filled 2016-08-10: qty 2

## 2016-08-10 MED ORDER — SODIUM CHLORIDE 0.9 % IV BOLUS (SEPSIS)
1000.0000 mL | Freq: Once | INTRAVENOUS | Status: AC
  Administered 2016-08-11: 1000 mL via INTRAVENOUS

## 2016-08-10 MED ORDER — IOPAMIDOL (ISOVUE-300) INJECTION 61%
100.0000 mL | Freq: Once | INTRAVENOUS | Status: AC | PRN
  Administered 2016-08-10: 100 mL via INTRAVENOUS

## 2016-08-10 NOTE — ED Notes (Signed)
Patient transported to CT 

## 2016-08-10 NOTE — ED Provider Notes (Signed)
[facility]-EMERGENCY DEPT Provider Note   CSN: [id number] Arrival date & time: 08/10/16  1959     History   Chief Complaint Chief Complaint  Patient presents with  . Abdominal Pain    HPI [name] is a 56 y.o. male.  HPI Patient presents with several days of upper abdominal pain. States he's had difficulty having bowel movements for the past month. Had a small bowel movement today. At that time nausea but no vomiting. No fever or chills. Patient states he has to take Relistor to have bowel movements. Was seen earlier in the emergency department left due to weight. He then called EMS to be transported in. Past Medical History:  Diagnosis Date  . Anemia    Patient states that he was recently told this  . Anxiety   . Depression   . GERD (gastroesophageal reflux disease)   . Hepatitis C   . MRSA infection   . Osteomyelitis (HCC)   . Paralysis (HCC)   . Paraparesis (HCC)    parapalegic   . 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    Patient Active Problem List   Diagnosis Date Noted  . Choledocholithiasis with acute cholecystitis 08/11/2016  . Rectal bleeding 06/01/2016  . Abdominal pain 01/28/2015  . Hepatitis C 05/17/2013  . Closed supracondylar fracture of right femur (HCC) 09/25/2012    Class: Acute  . Right radial head fracture 07/05/2012  . Chronic hepatitis C (HCC) 05/10/2011  . Decubitus ulcer 05/10/2011  . Femur fracture, left (HCC) 06/30/2010  . Paraplegia (HCC) 06/30/2010  . CALCU BD WITHOUT MENTION CHOLECYST/OBSTRUCTION 05/22/2008  . CLOSED FRACTURE OF SHAFT OF TIBIA 03/22/2007    Past Surgical History:  Procedure Laterality Date  . BACK SURGERY    . CHOLECYSTECTOMY    . FEMUR FRACTURE SURGERY  March 2012   retrograde intramedullary femoral nail  . GALLBLADDER SURGERY  2010  . HERNIA REPAIR    . ORIF FEMUR FRACTURE Right 08/04/2012   Procedure: OPEN REDUCTION INTERNAL FIXATION (ORIF) DISTAL FEMUR FRACTURE;   Surgeon: Eldred Manges, MD;  Location: [facility] OR;  Service: Orthopedics;  Laterality: Right;  Right Retrograde Supracondylar Nail Right Femur       Home Medications    Prior to Admission medications   Medication Sig Start Date End Date Taking? Authorizing Provider  Calcium Carbonate Antacid (ANTACID E-X PO) Take 4 tablets by mouth once as needed (for indigestion/abdominal pain).   Yes [provider]  methadone (DOLOPHINE) 10 MG tablet Take 20 mg by mouth 3 (three) times daily.   Yes [provider]  Methylnaltrexone Bromide (RELISTOR) 150 MG TABS Take 150 mg by mouth daily.   Yes [provider]  omeprazole (PRILOSEC) 20 MG capsule Take 1 capsule (20 mg total) by mouth daily. Patient taking differently: Take 20 mg by mouth as needed (indigestion).  03/24/16  Yes Horton, Mayer Masker, MD  pregabalin (LYRICA) 75 MG capsule Take 75 mg by mouth 3 (three) times daily.    Yes [provider]    Family History Family History  Problem Relation Age of Onset  . Ovarian cancer Mother   . Healthy Sister   . Hodgkin's lymphoma Sister   . Heart disease Sister   . Healthy Sister   . Healthy Son   . Healthy Daughter   . Anemia Daughter   . Thyroid disease Daughter   . Cancer Unknown  family history     Social History Social History  Substance Use Topics  . Smoking status: Former Smoker    Packs/day: 1.00    Years: 5.00    Types: Cigarettes    Quit date: 08/10/2010  . Smokeless tobacco: Never Used  . Alcohol use No     Comment: Patient quit drinking 1 year ago     Allergies   Patient has no known allergies.   Review of Systems Review of Systems  Constitutional: Negative for chills and fever.  Respiratory: Negative for shortness of breath.   Gastrointestinal: Positive for abdominal distention, abdominal pain, constipation and nausea. Negative for blood in stool, diarrhea and vomiting.  Genitourinary: Negative for dysuria, flank pain and  frequency.  Musculoskeletal: Negative for back pain, joint swelling, myalgias, neck pain and neck stiffness.  Skin: Negative for rash and wound.  Neurological: Positive for weakness (paraplegia). Negative for dizziness, light-headedness, numbness and headaches.  Psychiatric/Behavioral: Positive for agitation.  All other systems reviewed and are negative.    Physical Exam Updated Vital Signs BP 131/88 (BP Location: Left Arm)   Pulse 65   Temp 97.8 F (36.6 C) (Oral)   Resp 20   Ht 5\' 9"  (1.753 m)   Wt 62 kg (136 lb 9.6 oz)   SpO2 99%   BMI 20.17 kg/m   Physical Exam  Constitutional: He is oriented to person, place, and time. He appears well-developed and well-nourished.  Writhing around in bed  HENT:  Head: Normocephalic and atraumatic.  Mouth/Throat: Oropharynx is clear and moist.  Eyes: EOM are normal. Pupils are equal, round, and reactive to light.  Neck: Normal range of motion. Neck supple.  Cardiovascular: Normal rate and regular rhythm.  Exam reveals no gallop and no friction rub.   No murmur heard. Pulmonary/Chest: Effort normal and breath sounds normal. No respiratory distress. He has no wheezes. He has no rales. He exhibits no tenderness.  Abdominal: Soft. He exhibits distension. There is tenderness. There is no rebound and no guarding.  Diminished bowel sounds throughout. Diffusely tender to palpation but appears most tender in the epigastric region. No rebound or guarding.  Musculoskeletal: Normal range of motion. He exhibits no edema or tenderness.  Bilateral lower extremity atrophy.  Neurological: He is alert and oriented to person, place, and time.  Bilateral lower extremity weakness.  Skin: Skin is warm and dry. Capillary refill takes less than 2 seconds. No rash noted. No erythema.  Psychiatric:  Agitation  Nursing note and vitals reviewed.    ED Treatments / Results  Labs (all labs ordered are listed, but only abnormal results are displayed) Labs  Reviewed  CBC WITH DIFFERENTIAL/PLATELET - Abnormal; Notable for the following:       Result Value   Neutro Abs 9.0 (*)    Lymphs Abs 0.4 (*)    All other components within normal limits  COMPREHENSIVE METABOLIC PANEL - Abnormal; Notable for the following:    Glucose, Bld 189 (*)    Creatinine, Ser 0.55 (*)    Total Protein 8.4 (*)    AST 407 (*)    ALT 181 (*)    Alkaline Phosphatase 244 (*)    Total Bilirubin 1.5 (*)    All other components within normal limits  ACETAMINOPHEN LEVEL - Abnormal; Notable for the following:    Acetaminophen (Tylenol), Serum <10 (*)    All other components within normal limits  URINALYSIS, ROUTINE W REFLEX MICROSCOPIC - Abnormal; Notable for the following:  Color, Urine AMBER (*)    Glucose, UA 50 (*)    All other components within normal limits  HEPATITIS PANEL, ACUTE - Abnormal; Notable for the following:    HCV Ab >11.0 (*)    All other components within normal limits  CBC - Abnormal; Notable for the following:    Hemoglobin 11.9 (*)    HCT 36.3 (*)    All other components within normal limits  COMPREHENSIVE METABOLIC PANEL - Abnormal; Notable for the following:    Glucose, Bld 115 (*)    Creatinine, Ser 0.45 (*)    Albumin 3.3 (*)    AST 411 (*)    ALT 254 (*)    Alkaline Phosphatase 256 (*)    Total Bilirubin 2.5 (*)    All other components within normal limits  HEPATIC FUNCTION PANEL - Abnormal; Notable for the following:    Total Protein 6.4 (*)    Albumin 2.8 (*)    AST 101 (*)    ALT 148 (*)    Alkaline Phosphatase 211 (*)    Total Bilirubin 2.3 (*)    Bilirubin, Direct 1.4 (*)    All other components within normal limits  HEPATIC FUNCTION PANEL - Abnormal; Notable for the following:    Albumin 3.1 (*)    AST 171 (*)    ALT 199 (*)    Alkaline Phosphatase 251 (*)    Total Bilirubin 3.6 (*)    Bilirubin, Direct 2.4 (*)    Indirect Bilirubin 1.2 (*)    All other components within normal limits  CBC - Abnormal; Notable for  the following:    WBC 3.8 (*)    RBC 3.94 (*)    Hemoglobin 10.3 (*)    HCT 32.2 (*)    All other components within normal limits  MRSA PCR SCREENING  LIPASE, BLOOD  PROTIME-INR  APTT  RAPID URINE DRUG SCREEN, HOSP PERFORMED  ETHANOL  HIV ANTIBODY (ROUTINE TESTING)  AMYLASE    EKG  EKG Interpretation None       Radiology US Abdomen Complete  Result Date: 08/11/2016 CLINICAL DATA:  Elevated transaminases. Chronic abdominal pain. Cholecystectomy . EXAM: ABDOMEN ULTRASOUND COMPLETE COMPARISON:  CT 08/10/2016 . FINDINGS: Gallbladder: Cholecystectomy. Common bile duct: Diameter: 10.8 mm. Liver: No focal lesion identified. Within normal limits in parenchymal echogenicity mild intrahepatic biliary ductal dilatation IVC: No abnormality visualized. Pancreas: Visualized portion unremarkable. Spleen: Size and appearance within normal limits. Right Kidney: Length: 10.7 cm. Echogenicity within normal limits. No mass or hydronephrosis visualized. Left Kidney: Length: 11.1 cm. Echogenicity within normal limits. No mass or hydronephrosis visualized. Abdominal aorta: No aneurysm visualized. Other findings: None. IMPRESSION: 1. Cholecystectomy. 2. Mild intrahepatic and extrahepatic biliary ductal distention. No obstructing lesion identified. Similar findings noted on today's CT. Correlation with liver function tests suggested. MRCP may prove useful for further evaluation . Electronically Signed   By: Maisie Fus  Register   On: 08/11/2016 10:48   Ct Abdomen Pelvis W Contrast  Result Date: 08/10/2016 CLINICAL DATA:  No bowel movements in the month, abdominal pain EXAM: CT ABDOMEN AND PELVIS WITH CONTRAST TECHNIQUE: Multidetector CT imaging of the abdomen and pelvis was performed using the standard protocol following bolus administration of intravenous contrast. CONTRAST:  ISOVUE-300 IOPAMIDOL (ISOVUE-300) INJECTION 61% COMPARISON:  08/10/2016, CT 03/24/2016, 01/28/2015 FINDINGS: Lower chest: Lung bases  demonstrate no acute consolidation or pleural effusion. The heart is nonenlarged. Hepatobiliary: Moderate intra and extrahepatic biliary dilatation post cholecystectomy. Slight increased from January 2018 but  similar appearance in 2016. No focal hepatic abnormality. Pancreas: Atrophic.  No inflammatory changes or ductal dilatation. Spleen: Normal in size without focal abnormality. Adrenals/Urinary Tract: Adrenal glands are unremarkable. Kidneys are normal, without renal calculi, focal lesion, or hydronephrosis. Bladder is unremarkable. Stomach/Bowel: The stomach is nonenlarged. No dilated small bowel. No colon wall thickening. Scattered stool in the colon. Vascular/Lymphatic: Aortic atherosclerosis. IVC filter in the infrarenal IVC. No significantly enlarged lymph nodes. Reproductive: Prostate is unremarkable. Other: No free air or free fluid. Musculoskeletal: Proximal left femoral rod. Partially visualized bright decubitus ulcer with thinning of the right inferior pubic ramus, similar compared to prior study. IMPRESSION: 1. Negative for bowel obstruction or bowel wall thickening. There is scattered stool in the colon. 2. Slight increased intra hepatic biliary dilatation since the previous CT but similar appearance on 2016 CT, findings likely related to post cholecystectomy change although could be correlated with laboratory values. 3. Partially visualized right gluteal skin defects with thinning of underlying right ischial bone and inferior pubic ramus, no changed and likely related to sequela of prior decubitus ulcer and osteomyelitis. Electronically Signed   By: Jasmine PangKim  Fujinaga M.D.   On: 08/10/2016 23:34   Dg Abd Acute W/chest  Result Date: 08/10/2016 CLINICAL DATA:  Epigastric pain EXAM: DG ABDOMEN ACUTE W/ 1V CHEST COMPARISON:  03/24/2016, FINDINGS: Single-view chest demonstrates posterior spina rod with disrupted first cerclage wires. This is unchanged. No acute infiltrate or effusion. Normal  cardiomediastinal silhouette. Supine and upright views of the abdomen demonstrate no free air beneath the diaphragm. There are surgical clips in the right upper quadrant. Non obstructed gas pattern with decreased bowel gas throughout. Aortic atherosclerosis. Advanced arthritis of the hips. IVC filter at L2-L3 level. IMPRESSION: 1. No radiographic evidence for acute cardiopulmonary abnormality. 2. Overall nonobstructed gas pattern with diffusely decreased bowel gas. Electronically Signed   By: Jasmine PangKim  Fujinaga M.D.   On: 08/10/2016 21:54   Dg C-arm 1-60 Min-no Report  Result Date: 08/11/2016 Fluoroscopy was utilized by the requesting physician.  No radiographic interpretation.    Procedures Procedures (including critical care time)  Medications Ordered in ED Medications  methadone (DOLOPHINE) tablet 20 mg (20 mg Oral Given 08/12/16 0842)  pregabalin (LYRICA) capsule 75 mg (75 mg Oral Given 08/12/16 0842)  0.9 %  sodium chloride infusion ( Intravenous New Bag/Given 08/12/16 0212)  ondansetron (ZOFRAN) tablet 4 mg ( Oral MAR Unhold 08/11/16 1614)    Or  ondansetron (ZOFRAN) injection 4 mg ( Intravenous MAR Unhold 08/11/16 1614)  HYDROmorphone (DILAUDID) injection 2 mg ( Intravenous MAR Unhold 08/11/16 1614)  pantoprazole (PROTONIX) injection 40 mg (40 mg Intravenous Given 08/12/16 0232)  cefTRIAXone (ROCEPHIN) 2 g in dextrose 5 % 50 mL IVPB (0 g Intravenous Stopped 08/12/16 0912)  midazolam (VERSED) injection 1-2 mg (2 mg Intravenous Given 08/11/16 1332)  iopamidol (ISOVUE-300) 61 % injection (not administered)  glucagon (human recombinant) (GLUCAGEN) 1 MG injection (not administered)  sodium chloride 0.9 % bolus 1,000 mL (0 mLs Intravenous Stopped 08/11/16 0307)  morphine 4 MG/ML injection 4 mg (4 mg Intravenous Given 08/10/16 2226)  ondansetron (ZOFRAN) injection 4 mg (4 mg Intravenous Given 08/10/16 2226)  iopamidol (ISOVUE-300) 61 % injection 100 mL (100 mLs Intravenous Contrast Given 08/10/16 2314)  HYDROmorphone  (DILAUDID) injection 1 mg (1 mg Intravenous Given 08/11/16 0003)  sodium chloride 0.9 % bolus 1,000 mL (0 mLs Intravenous Stopped 08/11/16 0307)  HYDROmorphone (DILAUDID) injection 1 mg (1 mg Intravenous Given 08/11/16 0130)  ondansetron (ZOFRAN) injection 4  mg (4 mg Intravenous Given 08/11/16 1333)  glycopyrrolate (ROBINUL) injection 0.2 mg (0.2 mg Intravenous Given 08/11/16 1332)  lidocaine (XYLOCAINE) 2 % viscous mouth solution 5 mL (3 mLs Mouth/Throat Given 08/11/16 1334)     Initial Impression / Assessment and Plan / ED Course  I have reviewed the triage vital signs and the nursing notes.  Pertinent labs & imaging results that were available during my care of the patient were reviewed by me and considered in my medical decision making (see chart for details).      New elevation in his LFTs. Discussed with Dr. Darrick Penna. Outpatient follow-up if pain continues. Patient with persistent pain despite medication. Hospitalist to admit.  Final Clinical Impressions(s) / ED Diagnoses   Final diagnoses:  Pain of upper abdomen  Abnormal LFTs    New Prescriptions Current Discharge Medication List       Loren Racer, MD 08/12/16 1355

## 2016-08-10 NOTE — ED Notes (Signed)
Pt became very upset when I told him he would have to go back out in the waiting room. Pt was in motorized chair and immediately began backing up. Cussing at this nurse stating he would rather go home and die and rolled himself through door  And out of hospital

## 2016-08-10 NOTE — ED Triage Notes (Signed)
Pt reports that he had not had a BM in one month and had a BM today. Pt states BM was normal. Pt has been experiencing abdominal pain for several days but pain in abd has increased.since 130pm today

## 2016-08-10 NOTE — ED Triage Notes (Signed)
Pt c/o abd pain and states he has not had a normal bowel movement.

## 2016-08-11 ENCOUNTER — Observation Stay (HOSPITAL_COMMUNITY): Payer: Medicare Other

## 2016-08-11 ENCOUNTER — Observation Stay (HOSPITAL_COMMUNITY): Payer: Medicare Other | Admitting: Anesthesiology

## 2016-08-11 ENCOUNTER — Encounter (HOSPITAL_COMMUNITY)
Admission: EM | Disposition: A | Discharge status: Home / Self Care | Payer: Self-pay | Source: Home / Self Care | Attending: Emergency Medicine

## 2016-08-11 ENCOUNTER — Encounter (HOSPITAL_COMMUNITY): Payer: Self-pay | Admitting: Emergency Medicine

## 2016-08-11 DIAGNOSIS — R1010 Upper abdominal pain, unspecified: Secondary | ICD-10-CM

## 2016-08-11 DIAGNOSIS — K839 Disease of biliary tract, unspecified: Secondary | ICD-10-CM | POA: Diagnosis not present

## 2016-08-11 DIAGNOSIS — R945 Abnormal results of liver function studies: Secondary | ICD-10-CM | POA: Diagnosis not present

## 2016-08-11 DIAGNOSIS — R1013 Epigastric pain: Secondary | ICD-10-CM | POA: Diagnosis not present

## 2016-08-11 DIAGNOSIS — G8220 Paraplegia, unspecified: Secondary | ICD-10-CM | POA: Diagnosis not present

## 2016-08-11 DIAGNOSIS — R748 Abnormal levels of other serum enzymes: Secondary | ICD-10-CM

## 2016-08-11 DIAGNOSIS — B182 Chronic viral hepatitis C: Secondary | ICD-10-CM | POA: Diagnosis not present

## 2016-08-11 DIAGNOSIS — R109 Unspecified abdominal pain: Secondary | ICD-10-CM

## 2016-08-11 DIAGNOSIS — K8042 Calculus of bile duct with acute cholecystitis without obstruction: Secondary | ICD-10-CM

## 2016-08-11 DIAGNOSIS — K219 Gastro-esophageal reflux disease without esophagitis: Secondary | ICD-10-CM | POA: Diagnosis not present

## 2016-08-11 HISTORY — PX: ERCP: SHX5425

## 2016-08-11 HISTORY — PX: BALLOON DILATION: SHX5330

## 2016-08-11 LAB — PROTIME-INR
INR: 1.11
PROTHROMBIN TIME: 14.3 s (ref 11.4–15.2)

## 2016-08-11 LAB — CBC
HEMATOCRIT: 36.3 % — AB (ref 39.0–52.0)
HEMOGLOBIN: 11.9 g/dL — AB (ref 13.0–17.0)
MCH: 26.2 pg (ref 26.0–34.0)
MCHC: 32.8 g/dL (ref 30.0–36.0)
MCV: 80 fL (ref 78.0–100.0)
Platelets: 272 10*3/uL (ref 150–400)
RBC: 4.54 MIL/uL (ref 4.22–5.81)
RDW: 14.1 % (ref 11.5–15.5)
WBC: 10.3 10*3/uL (ref 4.0–10.5)

## 2016-08-11 LAB — HEPATIC FUNCTION PANEL
ALBUMIN: 3.1 g/dL — AB (ref 3.5–5.0)
ALT: 199 U/L — ABNORMAL HIGH (ref 17–63)
AST: 171 U/L — ABNORMAL HIGH (ref 15–41)
Alkaline Phosphatase: 251 U/L — ABNORMAL HIGH (ref 38–126)
BILIRUBIN INDIRECT: 1.2 mg/dL — AB (ref 0.3–0.9)
BILIRUBIN TOTAL: 3.6 mg/dL — AB (ref 0.3–1.2)
Bilirubin, Direct: 2.4 mg/dL — ABNORMAL HIGH (ref 0.1–0.5)
Total Protein: 7.1 g/dL (ref 6.5–8.1)

## 2016-08-11 LAB — COMPREHENSIVE METABOLIC PANEL
ALBUMIN: 3.3 g/dL — AB (ref 3.5–5.0)
ALK PHOS: 256 U/L — AB (ref 38–126)
ALT: 254 U/L — AB (ref 17–63)
ANION GAP: 7 (ref 5–15)
AST: 411 U/L — ABNORMAL HIGH (ref 15–41)
BILIRUBIN TOTAL: 2.5 mg/dL — AB (ref 0.3–1.2)
BUN: 6 mg/dL (ref 6–20)
CALCIUM: 8.9 mg/dL (ref 8.9–10.3)
CO2: 29 mmol/L (ref 22–32)
CREATININE: 0.45 mg/dL — AB (ref 0.61–1.24)
Chloride: 103 mmol/L (ref 101–111)
GFR calc Af Amer: >60 mL/min (ref >60)
GFR calc non Af Amer: >60 mL/min (ref >60)
GLUCOSE: 115 mg/dL — AB (ref 65–99)
Potassium: 3.9 mmol/L (ref 3.5–5.1)
Sodium: 139 mmol/L (ref 135–145)
Total Protein: 7.3 g/dL (ref 6.5–8.1)

## 2016-08-11 LAB — APTT: aPTT: 32 seconds (ref 24–36)

## 2016-08-11 LAB — RAPID URINE DRUG SCREEN, HOSP PERFORMED
Amphetamines: NOT DETECTED
Barbiturates: NOT DETECTED
Benzodiazepines: NOT DETECTED
Cocaine: NOT DETECTED
Opiates: NOT DETECTED
Tetrahydrocannabinol: NOT DETECTED

## 2016-08-11 LAB — ETHANOL

## 2016-08-11 LAB — MRSA PCR SCREENING: MRSA by PCR: NEGATIVE

## 2016-08-11 SURGERY — ERCP, WITH INTERVENTION IF INDICATED
Anesthesia: General | Site: Esophagus

## 2016-08-11 MED ORDER — LACTATED RINGERS IV SOLN
INTRAVENOUS | Status: DC | PRN
  Administered 2016-08-11: 14:00:00 via INTRAVENOUS

## 2016-08-11 MED ORDER — ONDANSETRON HCL 4 MG/2ML IJ SOLN
INTRAMUSCULAR | Status: AC
  Filled 2016-08-11: qty 2

## 2016-08-11 MED ORDER — GLYCOPYRROLATE 0.2 MG/ML IJ SOLN
0.2000 mg | Freq: Once | INTRAMUSCULAR | Status: AC
  Administered 2016-08-11: 0.2 mg via INTRAVENOUS

## 2016-08-11 MED ORDER — PROPOFOL 10 MG/ML IV BOLUS
INTRAVENOUS | Status: DC | PRN
  Administered 2016-08-11: 160 mg via INTRAVENOUS

## 2016-08-11 MED ORDER — IOPAMIDOL (ISOVUE-300) INJECTION 61%
INTRAVENOUS | Status: AC
  Filled 2016-08-11: qty 50

## 2016-08-11 MED ORDER — DEXTROSE 5 % IV SOLN
2.0000 g | INTRAVENOUS | Status: DC
  Administered 2016-08-11 – 2016-08-12 (×2): 2 g via INTRAVENOUS
  Filled 2016-08-11 (×3): qty 2

## 2016-08-11 MED ORDER — ONDANSETRON HCL 4 MG/2ML IJ SOLN
4.0000 mg | Freq: Once | INTRAMUSCULAR | Status: AC
  Administered 2016-08-11: 4 mg via INTRAVENOUS

## 2016-08-11 MED ORDER — LIDOCAINE VISCOUS 2 % MT SOLN
5.0000 mL | Freq: Once | OROMUCOSAL | Status: AC
  Administered 2016-08-11: 3 mL via OROMUCOSAL

## 2016-08-11 MED ORDER — METHYLNALTREXONE BROMIDE 150 MG PO TABS: 150.0000 mg | ORAL_TABLET | Freq: Every day | ORAL | Status: DC

## 2016-08-11 MED ORDER — GLYCOPYRROLATE 0.2 MG/ML IJ SOLN
INTRAMUSCULAR | Status: AC
  Filled 2016-08-11: qty 1

## 2016-08-11 MED ORDER — METHADONE HCL 10 MG PO TABS
20.0000 mg | ORAL_TABLET | Freq: Three times a day (TID) | ORAL | Status: DC
  Administered 2016-08-12: 20 mg via ORAL
  Filled 2016-08-11: qty 2

## 2016-08-11 MED ORDER — SUCCINYLCHOLINE CHLORIDE 20 MG/ML IJ SOLN
INTRAMUSCULAR | Status: AC
  Filled 2016-08-11: qty 1

## 2016-08-11 MED ORDER — ROCURONIUM BROMIDE 50 MG/5ML IV SOLN
INTRAVENOUS | Status: AC
  Filled 2016-08-11: qty 1

## 2016-08-11 MED ORDER — PREGABALIN 75 MG PO CAPS
75.0000 mg | ORAL_CAPSULE | Freq: Three times a day (TID) | ORAL | Status: DC
  Administered 2016-08-11 – 2016-08-12 (×3): 75 mg via ORAL
  Filled 2016-08-11 (×3): qty 1

## 2016-08-11 MED ORDER — FENTANYL CITRATE (PF) 100 MCG/2ML IJ SOLN
INTRAMUSCULAR | Status: AC
  Filled 2016-08-11: qty 2

## 2016-08-11 MED ORDER — SODIUM CHLORIDE 0.9 % IV SOLN
INTRAVENOUS | Status: DC
  Administered 2016-08-11 – 2016-08-12 (×3): via INTRAVENOUS

## 2016-08-11 MED ORDER — LIDOCAINE HCL (PF) 1 % IJ SOLN
INTRAMUSCULAR | Status: AC
  Filled 2016-08-11: qty 5

## 2016-08-11 MED ORDER — ONDANSETRON HCL 4 MG/2ML IJ SOLN: 4.0000 mg | Freq: Four times a day (QID) | INTRAMUSCULAR | Status: DC | PRN

## 2016-08-11 MED ORDER — FENTANYL CITRATE (PF) 100 MCG/2ML IJ SOLN
INTRAMUSCULAR | Status: DC | PRN
  Administered 2016-08-11: 100 ug via INTRAVENOUS

## 2016-08-11 MED ORDER — SODIUM CHLORIDE 0.9 % IV SOLN
INTRAVENOUS | Status: DC | PRN
  Administered 2016-08-11: 30 mL

## 2016-08-11 MED ORDER — MIDAZOLAM HCL 2 MG/2ML IJ SOLN
INTRAMUSCULAR | Status: DC | PRN
  Administered 2016-08-11: 2 mg via INTRAVENOUS

## 2016-08-11 MED ORDER — HYDROMORPHONE HCL 1 MG/ML IJ SOLN: 2.0000 mg | INTRAMUSCULAR | Status: DC | PRN

## 2016-08-11 MED ORDER — ONDANSETRON HCL 4 MG PO TABS: 4.0000 mg | ORAL_TABLET | Freq: Four times a day (QID) | ORAL | Status: DC | PRN

## 2016-08-11 MED ORDER — MIDAZOLAM HCL 2 MG/2ML IJ SOLN
1.0000 mg | INTRAMUSCULAR | Status: AC
  Administered 2016-08-11: 2 mg via INTRAVENOUS

## 2016-08-11 MED ORDER — PANTOPRAZOLE SODIUM 40 MG IV SOLR
40.0000 mg | INTRAVENOUS | Status: DC
  Administered 2016-08-11 – 2016-08-12 (×2): 40 mg via INTRAVENOUS
  Filled 2016-08-11 (×2): qty 40

## 2016-08-11 MED ORDER — FENTANYL CITRATE (PF) 100 MCG/2ML IJ SOLN
25.0000 ug | INTRAMUSCULAR | Status: DC | PRN
Start: 2016-08-11 — End: 2016-08-11
  Administered 2016-08-11 (×2): 50 ug via INTRAVENOUS

## 2016-08-11 MED ORDER — LACTATED RINGERS IV SOLN
INTRAVENOUS | Status: DC
  Administered 2016-08-11: 14:00:00 via INTRAVENOUS

## 2016-08-11 MED ORDER — GLYCOPYRROLATE 0.2 MG/ML IJ SOLN
INTRAMUSCULAR | Status: AC
  Filled 2016-08-11: qty 2

## 2016-08-11 MED ORDER — MIDAZOLAM HCL 2 MG/2ML IJ SOLN
INTRAMUSCULAR | Status: AC
Start: 2016-08-11 — End: ?
  Filled 2016-08-11: qty 2

## 2016-08-11 MED ORDER — NEOSTIGMINE METHYLSULFATE 10 MG/10ML IV SOLN
INTRAVENOUS | Status: DC | PRN
  Administered 2016-08-11: 3 mg via INTRAVENOUS

## 2016-08-11 MED ORDER — GLUCAGON HCL RDNA (DIAGNOSTIC) 1 MG IJ SOLR
INTRAMUSCULAR | Status: DC | PRN
  Administered 2016-08-11: 0.25 mg via INTRAVENOUS

## 2016-08-11 MED ORDER — LIDOCAINE VISCOUS 2 % MT SOLN
OROMUCOSAL | Status: AC
  Filled 2016-08-11: qty 15

## 2016-08-11 MED ORDER — ROCURONIUM BROMIDE 100 MG/10ML IV SOLN
INTRAVENOUS | Status: DC | PRN
  Administered 2016-08-11: 30 mg via INTRAVENOUS

## 2016-08-11 MED ORDER — GLYCOPYRROLATE 0.2 MG/ML IJ SOLN
INTRAMUSCULAR | Status: DC | PRN
  Administered 2016-08-11: 0.4 mg via INTRAVENOUS

## 2016-08-11 MED ORDER — MIDAZOLAM HCL 2 MG/2ML IJ SOLN
INTRAMUSCULAR | Status: AC
  Filled 2016-08-11: qty 2

## 2016-08-11 MED ORDER — GLUCAGON HCL RDNA (DIAGNOSTIC) 1 MG IJ SOLR
INTRAMUSCULAR | Status: AC
  Filled 2016-08-11: qty 2

## 2016-08-11 MED ORDER — LIDOCAINE HCL (CARDIAC) 20 MG/ML IV SOLN
INTRAVENOUS | Status: DC | PRN
  Administered 2016-08-11: 50 mg via INTRAVENOUS

## 2016-08-11 MED ORDER — HYDROMORPHONE HCL 1 MG/ML IJ SOLN
1.0000 mg | Freq: Once | INTRAMUSCULAR | Status: AC
  Administered 2016-08-11: 1 mg via INTRAVENOUS
  Filled 2016-08-11: qty 1

## 2016-08-11 MED ORDER — ENOXAPARIN SODIUM 40 MG/0.4ML ~~LOC~~ SOLN: 40.0000 mg | SUBCUTANEOUS | Status: DC

## 2016-08-11 SURGICAL SUPPLY — 27 items
BAG HAMPER (MISCELLANEOUS) ×3 IMPLANT
BALLN RETRIEVAL 12X15 (BALLOONS) IMPLANT
BALLN RETRIEVAL 12X15MM (BALLOONS)
BALN RTRVL 200 6-7FR 12-15 (BALLOONS)
BASKET TRAPEZOID 3X6 (MISCELLANEOUS) IMPLANT
BSKT STON RTRVL TRAPEZOID 3X6 (MISCELLANEOUS)
DEVICE INFLATION ENCORE 26 (MISCELLANEOUS) IMPLANT
DEVICE LOCKING W-BIOPSY CAP (MISCELLANEOUS) ×3 IMPLANT
FORCEPS BIOP SPYBITE 1.2X286 (FORCEP) ×2 IMPLANT
GUIDEWIRE HYDRA JAGWIRE .35 (WIRE) IMPLANT
GUIDEWIRE JAG HINI 025X260CM (WIRE) IMPLANT
KIT ENDO PROCEDURE PEN (KITS) ×3 IMPLANT
KIT ROOM TURNOVER APOR (KITS) ×3 IMPLANT
LUBRICANT JELLY 4.5OZ STERILE (MISCELLANEOUS) IMPLANT
PAD ARMBOARD 7.5X6 YLW CONV (MISCELLANEOUS) ×3 IMPLANT
PATHFINDER 450CM 0.18 (STENTS) IMPLANT
POSITIONER HEAD 8X9X4 ADT (SOFTGOODS) IMPLANT
SCOPE SPY DS DISPOSABLE (MISCELLANEOUS) ×3 IMPLANT
SNARE ROTATE MED OVAL 20MM (MISCELLANEOUS) IMPLANT
SNARE SHORT THROW 13M SML OVAL (MISCELLANEOUS) IMPLANT
SPHINCTEROTOME AUTOTOME .25 (MISCELLANEOUS) ×6 IMPLANT
SPHINCTEROTOME HYDRATOME 44 (MISCELLANEOUS) ×3 IMPLANT
SYSTEM CONTINUOUS INJECTION (MISCELLANEOUS) ×3 IMPLANT
TUBING INSUFFLATOR CO2MPACT (TUBING) ×3 IMPLANT
WALLSTENT METAL COVERED 10X60 (STENTS) IMPLANT
WALLSTENT METAL COVERED 10X80 (STENTS) IMPLANT
WATER STERILE IRR 1000ML POUR (IV SOLUTION) ×3 IMPLANT

## 2016-08-11 NOTE — Progress Notes (Signed)
This note also relates to the following rows which could not be included: ECG Heart Rate - Cannot attach notes to unvalidated device data  Charted at incorrect time- computer error

## 2016-08-11 NOTE — Care Management Obs Status (Signed)
MEDICARE OBSERVATION STATUS NOTIFICATION   Patient Details  Name: [name] MRN: [id number] Date of Birth: 10/31/1960   Medicare Observation Status Notification Given:  Yes    Malcolm MetroChildress, Corrie Reder Demske, RN 08/11/2016, 4:09 PM

## 2016-08-11 NOTE — Anesthesia Procedure Notes (Signed)
Procedure Name: Intubation Date/Time: 08/11/2016 1:57 PM Performed by: Raenette Rover Pre-anesthesia Checklist: Patient identified, Emergency Drugs available, Suction available and Patient being monitored Patient Re-evaluated:Patient Re-evaluated prior to inductionOxygen Delivery Method: Circle system utilized Preoxygenation: Pre-oxygenation with 100% oxygen Intubation Type: IV induction Ventilation: Mask ventilation without difficulty Laryngoscope Size: Mac and 4 Grade View: Grade I Tube type: Oral Tube size: 7.0 mm Number of attempts: 1 Airway Equipment and Method: Stylet Placement Confirmation: ETT inserted through vocal cords under direct vision,  positive ETCO2,  CO2 detector and breath sounds checked- equal and bilateral Secured at: 22 cm Tube secured with: Tape Dental Injury: Teeth and Oropharynx as per pre-operative assessment

## 2016-08-11 NOTE — Anesthesia Preprocedure Evaluation (Signed)
Anesthesia Evaluation  Patient identified by MRN, date of birth, ID band Patient awake    Reviewed: Allergy & Precautions, H&P , NPO status , Patient's Chart, lab work & pertinent test results, reviewed documented beta blocker date and time   Airway Mallampati: I  TM Distance: >3 FB     Dental  (+) Teeth Intact, Dental Advisory Given, Poor Dentition, Missing, Caps, Chipped   Pulmonary neg pulmonary ROS, former smoker,    breath sounds clear to auscultation       Cardiovascular negative cardio ROS   Rhythm:Regular Rate:Normal     Neuro/Psych PSYCHIATRIC DISORDERS Anxiety Depression negative neurological ROS     GI/Hepatic negative GI ROS, GERD  Medicated and Controlled,(+) Hepatitis -, C  Endo/Other  negative endocrine ROS  Renal/GU   negative genitourinary   Musculoskeletal negative musculoskeletal ROS (+)   Abdominal (+)  Abdomen: soft. Bowel sounds: normal.  Peds negative pediatric ROS (+)  Hematology negative hematology ROS (+)   Anesthesia Other Findings Paraplegic  Reproductive/Obstetrics negative OB ROS                             Anesthesia Physical Anesthesia Plan  ASA: III  Anesthesia Plan: General   Post-op Pain Management:    Induction: Intravenous  PONV Risk Score and Plan:   Airway Management Planned: Oral ETT  Additional Equipment:   Intra-op Plan:   Post-operative Plan: Extubation in OR  Informed Consent: I have reviewed the patients History and Physical, chart, labs and discussed the procedure including the risks, benefits and alternatives for the proposed anesthesia with the patient or authorized representative who has indicated his/her understanding and acceptance.     Plan Discussed with: CRNA, Anesthesiologist and Surgeon  Anesthesia Plan Comments:         Anesthesia Quick Evaluation

## 2016-08-11 NOTE — H&P (Addendum)
TRH H&P    Patient Demographics:    [name] Rice, is a 56 y.o. male  MRN: [id number]  DOB - [birth year]  Admit Date - 08/10/2016  Referring MD/NP/PA: Dr Ranae PalmsYelverton  Outpatient Primary MD for the patient is [name], Richard, MD  Patient coming from: Home  Chief Complaint  Patient presents with  . Abdominal Pain      HPI:    [name] Madej  is a 56 y.o. male, With history of hepatitis C, GERD, paraplegia who came to hospital with complaints of upper abdominal pain with nausea for the past 2 days. 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 Patient said that he started having abdominal pain yesterday and was severe he did have a small bowel movement today.  In the ED, lab work revealed elevated liver enzymes AST 407, ALT 181, alkaline phosphatase 244, total bili 1.5. CT scan of the abdomen pelvis was negative for bowel obstruction, showed scattered stool in the colon. Slight increased intrahepatic biliary dilatation.  GI was consulted by ED physician, and the plan to see patient in a.m.  He denies chest pain, no shortness of breath. Nausea, one episode of vomiting in the ED No fever or dysuria. He does have pressure ulcer.   Review of systems:     A full 10 point Review of Systems was done, except as stated above, all other Review of Systems were negative.   With Past History of the following :    Past Medical History:  Diagnosis Date  . Anemia    Patient states that he was recently told this  . Anxiety   . Depression   . GERD (gastroesophageal reflux disease)   . Hepatitis C   . MRSA infection   . Osteomyelitis (HCC)   . Paralysis (HCC)   . Paraparesis (HCC)    parapalegic   . 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      Past Surgical History:  Procedure Laterality Date  . BACK SURGERY      . CHOLECYSTECTOMY    . FEMUR FRACTURE SURGERY  March 2012   retrograde intramedullary femoral nail  . GALLBLADDER SURGERY  2010  . HERNIA REPAIR    . ORIF FEMUR FRACTURE Right 08/04/2012   Procedure: OPEN REDUCTION INTERNAL FIXATION (ORIF) DISTAL FEMUR FRACTURE;  Surgeon: Eldred MangesMark C Yates, MD;  Location: [facility] OR;  Service: Orthopedics;  Laterality: Right;  Right Retrograde Supracondylar Nail Right Femur      Social History:      Social History  Substance Use Topics  . Smoking status: Former Smoker    Packs/day: 1.00    Years: 5.00    Types: Cigarettes    Quit date: 08/10/2010  . Smokeless tobacco: Never Used  . Alcohol use No     Comment: Patient quit drinking 1 year ago       Family History :     Family History  Problem Relation Age  of Onset  . Ovarian cancer Mother   . Healthy Sister   . Hodgkin's lymphoma Sister   . Heart disease Sister   . Healthy Sister   . Healthy Son   . Healthy Daughter   . Anemia Daughter   . Thyroid disease Daughter   . Cancer Unknown        family history       Home Medications:   Prior to Admission medications   Medication Sig Start Date End Date Taking? Authorizing Provider  Calcium Carbonate Antacid (ANTACID E-X PO) Take 4 tablets by mouth once as needed (for indigestion/abdominal pain).   Yes [provider]  methadone (DOLOPHINE) 10 MG tablet Take 20 mg by mouth 3 (three) times daily.   Yes [provider]  Methylnaltrexone Bromide (RELISTOR) 150 MG TABS Take 150 mg by mouth daily.   Yes [provider]  omeprazole (PRILOSEC) 20 MG capsule Take 1 capsule (20 mg total) by mouth daily. Patient taking differently: Take 20 mg by mouth as needed (indigestion).  03/24/16  Yes Horton, Mayer Masker, MD  pregabalin (LYRICA) 75 MG capsule Take 75 mg by mouth 3 (three) times daily.    Yes [provider]     Allergies:    No Known Allergies   Physical Exam:   Vitals  Blood pressure (!) 152/99, pulse 71,  resp. rate 18, weight 63.5 kg (140 lb), SpO2 98 %.  1.  General: Appears in moderate distress  2. Psychiatric:  Intact judgement and  insight, awake alert, oriented x 3.  3. Neurologic: Paraparesis, able to move left lower extremity, but cannot bear weight on left leg  4. Eyes :  anicteric sclerae, moist conjunctivae with no lid lag. PERRLA.  5. ENMT:  Oropharynx clear with moist mucous membranes and good dentition  6. Neck:  supple, no cervical lymphadenopathy appriciated, No thyromegaly  7. Respiratory : Normal respiratory effort, good air movement bilaterally,clear to  auscultation bilaterally  8. Cardiovascular : RRR, no gallops, rubs or murmurs, no leg edema  9. Gastrointestinal:  Positive bowel sounds, abdomen soft, tender to palpation in epigastric region, mild guarding, no rigidity. No hepatosplenomegaly    Data Review:    CBC  Recent Labs Lab 08/10/16 2108  WBC 9.9  HGB 13.0  HCT 39.2  PLT 285  MCV 80.3  MCH 26.6  MCHC 33.2  RDW 14.0  LYMPHSABS 0.4*  MONOABS 0.5  EOSABS 0.0  BASOSABS 0.0   ------------------------------------------------------------------------------------------------------------------  Chemistries   Recent Labs Lab 08/10/16 2108  NA 137  K 3.8  CL 102  CO2 26  GLUCOSE 189*  BUN 10  CREATININE 0.55*  CALCIUM 8.9  AST 407*  ALT 181*  ALKPHOS 244*  BILITOT 1.5*   ------------------------------------------------------------------------------------------------------------------  ------------------------------------------------------------------------------------------------------------------ GFR: Estimated Creatinine Clearance: 93.7 mL/min (A) (by C-G formula based on SCr of 0.55 mg/dL (L)). Liver Function Tests:  Recent Labs Lab 08/10/16 2108  AST 407*  ALT 181*  ALKPHOS 244*  BILITOT 1.5*  PROT 8.4*  ALBUMIN 3.8    Recent Labs Lab 08/10/16 2108  LIPASE 17   No results for input(s): AMMONIA in the last  168 hours. Coagulation Profile:  Recent Labs Lab 08/10/16 2108  INR 1.11   Cardiac Enzymes: No results for input(s): CKTOTAL, CKMB, CKMBINDEX, TROPONINI in the last 168 hours. BNP (last 3 results) No results for input(s): PROBNP in the last 8760 hours. HbA1C: No results for input(s): HGBA1C in the last 72 hours. CBG: No results  for input(s): GLUCAP in the last 168 hours. Lipid Profile: No results for input(s): CHOL, HDL, LDLCALC, TRIG, CHOLHDL, LDLDIRECT in the last 72 hours. Thyroid Function Tests: No results for input(s): TSH, T4TOTAL, FREET4, T3FREE, THYROIDAB in the last 72 hours. Anemia Panel: No results for input(s): VITAMINB12, FOLATE, FERRITIN, TIBC, IRON, RETICCTPCT in the last 72 hours.  --------------------------------------------------------------------------------------------------------------- Urine analysis:    Component Value Date/Time   COLORURINE AMBER (A) 08/10/2016 2220   APPEARANCEUR CLEAR 08/10/2016 2220   LABSPEC 1.014 08/10/2016 2220   PHURINE 6.0 08/10/2016 2220   GLUCOSEU 50 (A) 08/10/2016 2220   HGBUR NEGATIVE 08/10/2016 2220   BILIRUBINUR NEGATIVE 08/10/2016 2220   KETONESUR NEGATIVE 08/10/2016 2220   PROTEINUR NEGATIVE 08/10/2016 2220   UROBILINOGEN 4.0 (H) 12/27/2013 1338   NITRITE NEGATIVE 08/10/2016 2220   LEUKOCYTESUR NEGATIVE 08/10/2016 2220      Imaging Results:    Ct Abdomen Pelvis W Contrast  Result Date: 08/10/2016 CLINICAL DATA:  No bowel movements in the month, abdominal pain EXAM: CT ABDOMEN AND PELVIS WITH CONTRAST TECHNIQUE: Multidetector CT imaging of the abdomen and pelvis was performed using the standard protocol following bolus administration of intravenous contrast. CONTRAST:  ISOVUE-300 IOPAMIDOL (ISOVUE-300) INJECTION 61% COMPARISON:  08/10/2016, CT 03/24/2016, 01/28/2015 FINDINGS: Lower chest: Lung bases demonstrate no acute consolidation or pleural effusion. The heart is nonenlarged. Hepatobiliary: Moderate intra  and extrahepatic biliary dilatation post cholecystectomy. Slight increased from January 2018 but similar appearance in 2016. No focal hepatic abnormality. Pancreas: Atrophic.  No inflammatory changes or ductal dilatation. Spleen: Normal in size without focal abnormality. Adrenals/Urinary Tract: Adrenal glands are unremarkable. Kidneys are normal, without renal calculi, focal lesion, or hydronephrosis. Bladder is unremarkable. Stomach/Bowel: The stomach is nonenlarged. No dilated small bowel. No colon wall thickening. Scattered stool in the colon. Vascular/Lymphatic: Aortic atherosclerosis. IVC filter in the infrarenal IVC. No significantly enlarged lymph nodes. Reproductive: Prostate is unremarkable. Other: No free air or free fluid. Musculoskeletal: Proximal left femoral rod. Partially visualized bright decubitus ulcer with thinning of the right inferior pubic ramus, similar compared to prior study. IMPRESSION: 1. Negative for bowel obstruction or bowel wall thickening. There is scattered stool in the colon. 2. Slight increased intra hepatic biliary dilatation since the previous CT but similar appearance on 2016 CT, findings likely related to post cholecystectomy change although could be correlated with laboratory values. 3. Partially visualized right gluteal skin defects with thinning of underlying right ischial bone and inferior pubic ramus, no changed and likely related to sequela of prior decubitus ulcer and osteomyelitis. Electronically Signed   By: Jasmine Pang M.D.   On: 08/10/2016 23:34   Dg Abd Acute W/chest  Result Date: 08/10/2016 CLINICAL DATA:  Epigastric pain EXAM: DG ABDOMEN ACUTE W/ 1V CHEST COMPARISON:  03/24/2016, FINDINGS: Single-view chest demonstrates posterior spina rod with disrupted first cerclage wires. This is unchanged. No acute infiltrate or effusion. Normal cardiomediastinal silhouette. Supine and upright views of the abdomen demonstrate no free air beneath the diaphragm. There are  surgical clips in the right upper quadrant. Non obstructed gas pattern with decreased bowel gas throughout. Aortic atherosclerosis. Advanced arthritis of the hips. IVC filter at L2-L3 level. IMPRESSION: 1. No radiographic evidence for acute cardiopulmonary abnormality. 2. Overall nonobstructed gas pattern with diffusely decreased bowel gas. Electronically Signed   By: Jasmine Pang M.D.   On: 08/10/2016 21:54       Assessment & Plan:    Active Problems:   Abdominal pain   1. Abdominal pain- unclear  etiology, could be due to constipation, biliary spasm( methadone has been associated with biliary spasm as well as development of common bile duct dilation, patients with hepatitis C), lipase is 17. Hepatitis panel is pending. Patient does have history of hepatitis C. Will obtain abdominal ultrasound in a.m. Keep nothing by mouth. Start IV normal saline at 100 ML per hour. We'll consult GI in a.m. start Dilaudid 2 mg IV every 4 hours when necessary for pain. 2. Abnormal LFTs- patient does have elevated AST, ALT, alkaline phosphatase, total bili. Unclear etiology, could be due to above. GI consulted. Follow CMP in a.m. 3. Constipation- due to opioid use, continue Relistor. 4. Paraplegia-stable   DVT Prophylaxis-   Lovenox   AM Labs Ordered, also please review Full Orders  Family Communication: Admission, patients condition and plan of care including tests being ordered have been discussed with the patient  who indicate understanding and agree with the plan and Code Status.  Code Status:  Full code  Admission status: Observation    Time spent in minutes : 60 minutes   Tanaysia Bhardwaj S M.D on 08/11/2016 at 12:58 AM  Between 7am to 7pm - Pager - [phone]. After 7pm go to [email or web address] - password [facility]  [facility] - Office  [phone]

## 2016-08-11 NOTE — Consult Note (Signed)
Reason for Consult: Referring Physician:   [name] is an 56 y.o. male.  HPI: Admitted thru the ED yesterday. C/o upper abdominal pain. Hx of cholecystectomy  By Dr. Geroge Baseman. Pre op:   PREOPERATIVE DIAGNOSES:  1. Acute cholecystitis.  2. Cholelithiasis.  3. Suspected choledocholithiasis with jaundice. In 2010 underwent ERCP by Dr. Gala Romney for stone extraction. Rated the pain yesterday was beyond 10.  He says he has had this pain off and on since his gallbladder surgery. Denies drug abuse presently. 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  Has had nausea but no vomiting.  Rates pain 8/10.  Last BM yesterday x 2  (medium size). His appetite has not changed. He is scheduled for an [place] this morning.  Treated for Hepatitis C. Treated with Harvoni in 2015.      Past Medical History:  Diagnosis Date  . Anemia    Patient states that he was recently told this  . Anxiety   . Depression   . GERD (gastroesophageal reflux disease)   . Hepatitis C   . MRSA infection   . Osteomyelitis (Uncertain)   . Paralysis ([place])   . Paraparesis ([place])    parapalegic   . 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    Past Surgical History:  Procedure Laterality Date  . BACK SURGERY    . CHOLECYSTECTOMY    . FEMUR FRACTURE SURGERY  March 2012   retrograde intramedullary femoral nail  . GALLBLADDER SURGERY  2010  . HERNIA REPAIR    . ORIF FEMUR FRACTURE Right 08/04/2012   Procedure: OPEN REDUCTION INTERNAL FIXATION (ORIF) DISTAL FEMUR FRACTURE;  Surgeon: Marybelle Killings, MD;  Location: [facility];  Service: Orthopedics;  Laterality: Right;  Right Retrograde Supracondylar Nail Right Femur    Family History  Problem Relation Age of Onset  . Ovarian cancer Mother   . Healthy Sister   . Hodgkin's lymphoma Sister   . Heart disease Sister   . Healthy Sister   . Healthy Son   . Healthy Daughter   . Anemia Daughter   . Thyroid disease Daughter   .  Cancer Unknown        family history     Social History:  reports that he quit smoking about 6 years ago. His smoking use included Cigarettes. He has a 5.00 pack-year smoking history. He has never used smokeless tobacco. He reports that he does not drink alcohol or use drugs.  Allergies: No Known Allergies  Medications: I have reviewed the patient's current medications.  Results for orders placed or performed during the hospital encounter of 08/10/16 (from the past 48 hour(s))  CBC with Differential/Platelet     Status: Abnormal   Collection Time: 08/10/16  9:08 PM  Result Value Ref Range   WBC 9.9 4.0 - 10.5 K/uL   RBC 4.88 4.22 - 5.81 MIL/uL   Hemoglobin 13.0 13.0 - 17.0 g/dL   HCT 39.2 39.0 - 52.0 %   MCV 80.3 78.0 - 100.0 fL   MCH 26.6 26.0 - 34.0 pg   MCHC 33.2 30.0 - 36.0 g/dL   RDW 14.0 11.5 - 15.5 %   Platelets 285 150 - 400 K/uL   Neutrophils Relative % 91 %   Neutro Abs 9.0 (H) 1.7 - 7.7 K/uL   Lymphocytes Relative 4 %   Lymphs Abs 0.4 (L) 0.7 - 4.0 K/uL   Monocytes  Relative 5 %   Monocytes Absolute 0.5 0.1 - 1.0 K/uL   Eosinophils Relative 0 %   Eosinophils Absolute 0.0 0.0 - 0.7 K/uL   Basophils Relative 0 %   Basophils Absolute 0.0 0.0 - 0.1 K/uL  Comprehensive metabolic panel     Status: Abnormal   Collection Time: 08/10/16  9:08 PM  Result Value Ref Range   Sodium 137 135 - 145 mmol/L   Potassium 3.8 3.5 - 5.1 mmol/L   Chloride 102 101 - 111 mmol/L   CO2 26 22 - 32 mmol/L   Glucose, Bld 189 (H) 65 - 99 mg/dL   BUN 10 6 - 20 mg/dL   Creatinine, Ser 0.55 (L) 0.61 - 1.24 mg/dL   Calcium 8.9 8.9 - 10.3 mg/dL   Total Protein 8.4 (H) 6.5 - 8.1 g/dL   Albumin 3.8 3.5 - 5.0 g/dL   AST 407 (H) 15 - 41 U/L   ALT 181 (H) 17 - 63 U/L   Alkaline Phosphatase 244 (H) 38 - 126 U/L   Total Bilirubin 1.5 (H) 0.3 - 1.2 mg/dL   GFR calc non Af Amer >60 >60 mL/min   GFR calc Af Amer >60 >60 mL/min    Comment: (NOTE) The eGFR has been calculated using the CKD EPI  equation. This calculation has not been validated in all clinical situations. eGFR's persistently <60 mL/min signify possible Chronic Kidney Disease.    Anion gap 9 5 - 15  Lipase, blood     Status: None   Collection Time: 08/10/16  9:08 PM  Result Value Ref Range   Lipase 17 11 - 51 U/L  Protime-INR     Status: None   Collection Time: 08/10/16  9:08 PM  Result Value Ref Range   Prothrombin Time 14.3 11.4 - 15.2 seconds   INR 1.11   APTT     Status: None   Collection Time: 08/10/16  9:08 PM  Result Value Ref Range   aPTT 32 24 - 36 seconds  Ethanol     Status: None   Collection Time: 08/10/16  9:08 PM  Result Value Ref Range   Alcohol, Ethyl (B) <5 <5 mg/dL    Comment:        LOWEST DETECTABLE LIMIT FOR SERUM ALCOHOL IS 5 mg/dL FOR MEDICAL PURPOSES ONLY   Acetaminophen level     Status: Abnormal   Collection Time: 08/10/16  9:21 PM  Result Value Ref Range   Acetaminophen (Tylenol), Serum <10 (L) 10 - 30 ug/mL    Comment:        THERAPEUTIC CONCENTRATIONS VARY SIGNIFICANTLY. A RANGE OF 10-30 ug/mL MAY BE AN EFFECTIVE CONCENTRATION FOR MANY PATIENTS. HOWEVER, SOME ARE BEST TREATED AT CONCENTRATIONS OUTSIDE THIS RANGE. ACETAMINOPHEN CONCENTRATIONS >150 ug/mL AT 4 HOURS AFTER INGESTION AND >50 ug/mL AT 12 HOURS AFTER INGESTION ARE OFTEN ASSOCIATED WITH TOXIC REACTIONS.   Urinalysis, Routine w reflex microscopic     Status: Abnormal   Collection Time: 08/10/16 10:20 PM  Result Value Ref Range   Color, Urine AMBER (A) YELLOW    Comment: BIOCHEMICALS MAY BE AFFECTED BY COLOR   APPearance CLEAR CLEAR   Specific Gravity, Urine 1.014 1.005 - 1.030   pH 6.0 5.0 - 8.0   Glucose, UA 50 (A) NEGATIVE mg/dL   Hgb urine dipstick NEGATIVE NEGATIVE   Bilirubin Urine NEGATIVE NEGATIVE   Ketones, ur NEGATIVE NEGATIVE mg/dL   Protein, ur NEGATIVE NEGATIVE mg/dL   Nitrite NEGATIVE NEGATIVE   Leukocytes,  UA NEGATIVE NEGATIVE  Rapid urine drug screen (hospital performed)      Status: None   Collection Time: 08/10/16 10:20 PM  Result Value Ref Range   Opiates NONE DETECTED NONE DETECTED   Cocaine NONE DETECTED NONE DETECTED   Benzodiazepines NONE DETECTED NONE DETECTED   Amphetamines NONE DETECTED NONE DETECTED   Tetrahydrocannabinol NONE DETECTED NONE DETECTED   Barbiturates NONE DETECTED NONE DETECTED    Comment:        DRUG SCREEN FOR MEDICAL PURPOSES ONLY.  IF CONFIRMATION IS NEEDED FOR ANY PURPOSE, NOTIFY LAB WITHIN 5 DAYS.        LOWEST DETECTABLE LIMITS FOR URINE DRUG SCREEN Drug Class       Cutoff (ng/mL) Amphetamine      1000 Barbiturate      200 Benzodiazepine   202 Tricyclics       542 Opiates          300 Cocaine          300 THC              50   CBC     Status: Abnormal   Collection Time: 08/11/16  4:53 AM  Result Value Ref Range   WBC 10.3 4.0 - 10.5 K/uL   RBC 4.54 4.22 - 5.81 MIL/uL   Hemoglobin 11.9 (L) 13.0 - 17.0 g/dL   HCT 36.3 (L) 39.0 - 52.0 %   MCV 80.0 78.0 - 100.0 fL   MCH 26.2 26.0 - 34.0 pg   MCHC 32.8 30.0 - 36.0 g/dL   RDW 14.1 11.5 - 15.5 %   Platelets 272 150 - 400 K/uL  Comprehensive metabolic panel     Status: Abnormal   Collection Time: 08/11/16  4:53 AM  Result Value Ref Range   Sodium 139 135 - 145 mmol/L   Potassium 3.9 3.5 - 5.1 mmol/L   Chloride 103 101 - 111 mmol/L   CO2 29 22 - 32 mmol/L   Glucose, Bld 115 (H) 65 - 99 mg/dL   BUN 6 6 - 20 mg/dL   Creatinine, Ser 0.45 (L) 0.61 - 1.24 mg/dL   Calcium 8.9 8.9 - 10.3 mg/dL   Total Protein 7.3 6.5 - 8.1 g/dL   Albumin 3.3 (L) 3.5 - 5.0 g/dL   AST 411 (H) 15 - 41 U/L   ALT 254 (H) 17 - 63 U/L   Alkaline Phosphatase 256 (H) 38 - 126 U/L   Total Bilirubin 2.5 (H) 0.3 - 1.2 mg/dL   GFR calc non Af Amer >60 >60 mL/min   GFR calc Af Amer >60 >60 mL/min    Comment: (NOTE) The eGFR has been calculated using the CKD EPI equation. This calculation has not been validated in all clinical situations. eGFR's persistently <60 mL/min signify possible Chronic  Kidney Disease.    Anion gap 7 5 - 15    Ct Abdomen Pelvis W Contrast  Result Date: 08/10/2016 CLINICAL DATA:  No bowel movements in the month, abdominal pain EXAM: CT ABDOMEN AND PELVIS WITH CONTRAST TECHNIQUE: Multidetector CT imaging of the abdomen and pelvis was performed using the standard protocol following bolus administration of intravenous contrast. CONTRAST:  183m ISOVUE-300 IOPAMIDOL (ISOVUE-300) INJECTION 61% COMPARISON:  08/10/2016, CT 03/24/2016, 01/28/2015 FINDINGS: Lower chest: Lung bases demonstrate no acute consolidation or pleural effusion. The heart is nonenlarged. Hepatobiliary: Moderate intra and extrahepatic biliary dilatation post cholecystectomy. Slight increased from January 2018 but similar appearance in 2016. No focal hepatic abnormality. Pancreas: Atrophic.  No inflammatory changes or ductal dilatation. Spleen: Normal in size without focal abnormality. Adrenals/Urinary Tract: Adrenal glands are unremarkable. Kidneys are normal, without renal calculi, focal lesion, or hydronephrosis. Bladder is unremarkable. Stomach/Bowel: The stomach is nonenlarged. No dilated small bowel. No colon wall thickening. Scattered stool in the colon. Vascular/Lymphatic: Aortic atherosclerosis. IVC filter in the infrarenal IVC. No significantly enlarged lymph nodes. Reproductive: Prostate is unremarkable. Other: No free air or free fluid. Musculoskeletal: Proximal left femoral rod. Partially visualized bright decubitus ulcer with thinning of the right inferior pubic ramus, similar compared to prior study. IMPRESSION: 1. Negative for bowel obstruction or bowel wall thickening. There is scattered stool in the colon. 2. Slight increased intra hepatic biliary dilatation since the previous CT but similar appearance on 2016 CT, findings likely related to post cholecystectomy change although could be correlated with laboratory values. 3. Partially visualized right gluteal skin defects with thinning of  underlying right ischial bone and inferior pubic ramus, no changed and likely related to sequela of prior decubitus ulcer and osteomyelitis. Electronically Signed   By: Donavan Foil M.D.   On: 08/10/2016 23:34   Dg Abd Acute W/chest  Result Date: 08/10/2016 CLINICAL DATA:  Epigastric pain EXAM: DG ABDOMEN ACUTE W/ 1V CHEST COMPARISON:  03/24/2016, FINDINGS: Single-view chest demonstrates posterior spina rod with disrupted first cerclage wires. This is unchanged. No acute infiltrate or effusion. Normal cardiomediastinal silhouette. Supine and upright views of the abdomen demonstrate no free air beneath the diaphragm. There are surgical clips in the right upper quadrant. Non obstructed gas pattern with decreased bowel gas throughout. Aortic atherosclerosis. Advanced arthritis of the hips. IVC filter at L2-L3 level. IMPRESSION: 1. No radiographic evidence for acute cardiopulmonary abnormality. 2. Overall nonobstructed gas pattern with diffusely decreased bowel gas. Electronically Signed   By: Donavan Foil M.D.   On: 08/10/2016 21:54    ROS Blood pressure 139/87, pulse 70, temperature 98 F (36.7 C), temperature source Oral, resp. rate 18, height _0  (1.753 m), weight 136 lb 9.6 oz (62 kg), SpO2 99 %. Physical Exam  Alert and oriented. Skin warm and dry. Oral mucosa is moist.   . Sclera anicteric, conjunctivae is pink. Thyroid not enlarged. No cervical lymphadenopathy. Lungs clear. Heart regular rate and rhythm.  Abdomen is soft. Bowel sounds are positive. No hepatomegaly. No abdominal masses felt. He has epigastric tenderness.  No edema to lower extremities.  Pressure ulcer to rt buttock which a dressing is in place. Able to move leg leg (non weight bearing). Unable to move rt leg.        Assessment/Plan: Left epigastric pain. Suspect CBD stone. Hx of same in 2010.  Elevated liver enzymes.  [place] is pending. Hepatitis panel is pending.  Rocephin 1gm IV q 24hrs. Further recommendations to  follow. Will discuss with Dr. Laural Golden.   [name] W 08/11/2016, 7:46 AM

## 2016-08-11 NOTE — Transfer of Care (Signed)
Immediate Anesthesia Transfer of Care Note  Patient: [name]  Procedure(s) Performed: Procedure(s): ENDOSCOPIC RETROGRADE CHOLANGIOPANCREATOGRAPHY (ERCP) extended Sphincterotomy (N/A) BALLOON DILATION (N/A)  Patient Location: PACU  Anesthesia Type:General  Level of Consciousness: awake and alert   Airway & Oxygen Therapy: Patient Spontanous Breathing  Post-op Assessment: Report given to RN and Post -op Vital signs reviewed and stable  Post vital signs: Reviewed and stable  Last Vitals:  Vitals:   08/11/16 1335 08/11/16 1340  BP: 135/78 132/75  Pulse:    Resp: 19 (!) 0  Temp:      Last Pain:  Vitals:   08/11/16 1317  TempSrc:   PainSc: 8       Patients Stated Pain Goal: 3 (08/11/16 1317)  Complications: No apparent anesthesia complications

## 2016-08-11 NOTE — Progress Notes (Signed)
Patient with suspected choledocholithiasis status post cholecystectomy with elevated alkaline phosphatase bilirubin and transaminases for reported ERCP this afternoon according to patient pain is much improved over yesterday Madaline BrilliantDerek Dovel ZOX:[id number] DOB: [birth year] DOA: 08/10/2016 PCP: Oval Linseyondiego, Raniyah Curenton, MD   Physical Exam: Blood pressure 139/87, pulse 70, temperature 98 F (36.7 C), temperature source Oral, resp. rate 18, height 5\' 9"  (1.753 m), weight 62 kg (136 lb 9.6 oz), SpO2 99 %. Lungs clear to A&P no rales wheeze rhonchi heart regular rhythm no murmurs gallops heaves thrills rubs abdomen mild right upper quadrant tenderness bowel sounds normoactive positive guarding no rebound noted  Investigations:  Recent Results (from the past 240 hour(s))  MRSA PCR Screening     Status: None   Collection Time: 08/11/16  2:40 AM  Result Value Ref Range Status   MRSA by PCR NEGATIVE NEGATIVE Final    Comment:        The GeneXpert MRSA Assay (FDA approved for NASAL specimens only), is one component of a comprehensive MRSA colonization surveillance program. It is not intended to diagnose MRSA infection nor to guide or monitor treatment for MRSA infections.      Basic Metabolic Panel:  Recent Labs  81/19/1404/07/23 2108 08/11/16 0453  NA 137 139  K 3.8 3.9  CL 102 103  CO2 26 29  GLUCOSE 189* 115*  BUN 10 6  CREATININE 0.55* 0.45*  CALCIUM 8.9 8.9   Liver Function Tests:  Recent Labs  08/10/16 2108 08/11/16 0453  AST 407* 411*  ALT 181* 254*  ALKPHOS 244* 256*  BILITOT 1.5* 2.5*  PROT 8.4* 7.3  ALBUMIN 3.8 3.3*     CBC:  Recent Labs  08/10/16 2108 08/11/16 0453  WBC 9.9 10.3  NEUTROABS 9.0*  --   HGB 13.0 11.9*  HCT 39.2 36.3*  MCV 80.3 80.0  PLT 285 272    [place] Abdomen Complete  Result Date: 08/11/2016 CLINICAL DATA:  Elevated transaminases. Chronic abdominal pain. Cholecystectomy . EXAM: ABDOMEN ULTRASOUND COMPLETE COMPARISON:  CT 08/10/2016 . FINDINGS:  Gallbladder: Cholecystectomy. Common bile duct: Diameter: 10.8 mm. Liver: No focal lesion identified. Within normal limits in parenchymal echogenicity mild intrahepatic biliary ductal dilatation IVC: No abnormality visualized. Pancreas: Visualized portion unremarkable. Spleen: Size and appearance within normal limits. Right Kidney: Length: 10.7 cm. Echogenicity within normal limits. No mass or hydronephrosis visualized. Left Kidney: Length: 11.1 cm. Echogenicity within normal limits. No mass or hydronephrosis visualized. Abdominal aorta: No aneurysm visualized. Other findings: None. IMPRESSION: 1. Cholecystectomy. 2. Mild intrahepatic and extrahepatic biliary ductal distention. No obstructing lesion identified. Similar findings noted on today's CT. Correlation with liver function tests suggested. MRCP may prove useful for further evaluation . Electronically Signed   By: Maisie Fushomas  Register   On: 08/11/2016 10:48   Ct Abdomen Pelvis W Contrast  Result Date: 08/10/2016 CLINICAL DATA:  No bowel movements in the month, abdominal pain EXAM: CT ABDOMEN AND PELVIS WITH CONTRAST TECHNIQUE: Multidetector CT imaging of the abdomen and pelvis was performed using the standard protocol following bolus administration of intravenous contrast. CONTRAST:  100mL ISOVUE-300 IOPAMIDOL (ISOVUE-300) INJECTION 61% COMPARISON:  08/10/2016, CT 03/24/2016, 01/28/2015 FINDINGS: Lower chest: Lung bases demonstrate no acute consolidation or pleural effusion. The heart is nonenlarged. Hepatobiliary: Moderate intra and extrahepatic biliary dilatation post cholecystectomy. Slight increased from January 2018 but similar appearance in 2016. No focal hepatic abnormality. Pancreas: Atrophic.  No inflammatory changes or ductal dilatation. Spleen: Normal in size without focal abnormality. Adrenals/Urinary Tract: Adrenal glands are unremarkable. Kidneys are  normal, without renal calculi, focal lesion, or hydronephrosis. Bladder is unremarkable.  Stomach/Bowel: The stomach is nonenlarged. No dilated small bowel. No colon wall thickening. Scattered stool in the colon. Vascular/Lymphatic: Aortic atherosclerosis. IVC filter in the infrarenal IVC. No significantly enlarged lymph nodes. Reproductive: Prostate is unremarkable. Other: No free air or free fluid. Musculoskeletal: Proximal left femoral rod. Partially visualized bright decubitus ulcer with thinning of the right inferior pubic ramus, similar compared to prior study. IMPRESSION: 1. Negative for bowel obstruction or bowel wall thickening. There is scattered stool in the colon. 2. Slight increased intra hepatic biliary dilatation since the previous CT but similar appearance on 2016 CT, findings likely related to post cholecystectomy change although could be correlated with laboratory values. 3. Partially visualized right gluteal skin defects with thinning of underlying right ischial bone and inferior pubic ramus, no changed and likely related to sequela of prior decubitus ulcer and osteomyelitis. Electronically Signed   By: Jasmine Pang M.D.   On: 08/10/2016 23:34   Dg Abd Acute W/chest  Result Date: 08/10/2016 CLINICAL DATA:  Epigastric pain EXAM: DG ABDOMEN ACUTE W/ 1V CHEST COMPARISON:  03/24/2016, FINDINGS: Single-view chest demonstrates posterior spina rod with disrupted first cerclage wires. This is unchanged. No acute infiltrate or effusion. Normal cardiomediastinal silhouette. Supine and upright views of the abdomen demonstrate no free air beneath the diaphragm. There are surgical clips in the right upper quadrant. Non obstructed gas pattern with decreased bowel gas throughout. Aortic atherosclerosis. Advanced arthritis of the hips. IVC filter at L2-L3 level. IMPRESSION: 1. No radiographic evidence for acute cardiopulmonary abnormality. 2. Overall nonobstructed gas pattern with diffusely decreased bowel gas. Electronically Signed   By: Jasmine Pang M.D.   On: 08/10/2016 21:54       Medications:  Impression:  Active Problems:   Abdominal pain     Plan: Hepatic profile daily for 3 days. ERCP this afternoon for possible stone extraction monitor clinical course and continue analgesia  Consultants: Gastroenterology   Procedures for ERCP   Antibiotics:            Time spent: 30 minutes   LOS: 0 days   Paytyn Mesta M   08/11/2016, 12:26 PM

## 2016-08-11 NOTE — Anesthesia Postprocedure Evaluation (Signed)
Anesthesia Post Note  Patient: [name]  Procedure(s) Performed: Procedure(s) (LRB): ENDOSCOPIC RETROGRADE CHOLANGIOPANCREATOGRAPHY (ERCP) extended Sphincterotomy (N/A) BALLOON DILATION (N/A)  Patient location during evaluation: PACU Anesthesia Type: General Level of consciousness: awake and alert Pain control: 6/10 meds administered and relief obtained. Vital Signs Assessment: post-procedure vital signs reviewed and stable Respiratory status: spontaneous breathing Cardiovascular status: stable Anesthetic complications: no     Last Vitals:  Vitals:   08/11/16 1512 08/11/16 1530  BP: 139/90 (!) 152/95  Pulse: 74 68  Resp: 18 18  Temp: 36.6 C     Last Pain:  Vitals:   08/11/16 1530  TempSrc:   PainSc: 0-No pain                 Patrycja Mumpower

## 2016-08-12 DIAGNOSIS — K839 Disease of biliary tract, unspecified: Secondary | ICD-10-CM | POA: Diagnosis not present

## 2016-08-12 DIAGNOSIS — R109 Unspecified abdominal pain: Secondary | ICD-10-CM | POA: Diagnosis not present

## 2016-08-12 DIAGNOSIS — R748 Abnormal levels of other serum enzymes: Secondary | ICD-10-CM | POA: Diagnosis not present

## 2016-08-12 LAB — HEPATIC FUNCTION PANEL
ALT: 148 U/L — ABNORMAL HIGH (ref 17–63)
AST: 101 U/L — AB (ref 15–41)
Albumin: 2.8 g/dL — ABNORMAL LOW (ref 3.5–5.0)
Alkaline Phosphatase: 211 U/L — ABNORMAL HIGH (ref 38–126)
BILIRUBIN DIRECT: 1.4 mg/dL — AB (ref 0.1–0.5)
BILIRUBIN TOTAL: 2.3 mg/dL — AB (ref 0.3–1.2)
Indirect Bilirubin: 0.9 mg/dL (ref 0.3–0.9)
Total Protein: 6.4 g/dL — ABNORMAL LOW (ref 6.5–8.1)

## 2016-08-12 LAB — HEPATITIS PANEL, ACUTE
HCV Ab: >11 s/co ratio — ABNORMAL HIGH (ref 0.0–0.9)
HEP A IGM: NEGATIVE
HEP B C IGM: NEGATIVE
HEP B S AG: NEGATIVE

## 2016-08-12 LAB — CBC
HEMATOCRIT: 32.2 % — AB (ref 39.0–52.0)
Hemoglobin: 10.3 g/dL — ABNORMAL LOW (ref 13.0–17.0)
MCH: 26.1 pg (ref 26.0–34.0)
MCHC: 32 g/dL (ref 30.0–36.0)
MCV: 81.7 fL (ref 78.0–100.0)
PLATELETS: 212 10*3/uL (ref 150–400)
RBC: 3.94 MIL/uL — ABNORMAL LOW (ref 4.22–5.81)
RDW: 14.5 % (ref 11.5–15.5)
WBC: 3.8 10*3/uL — ABNORMAL LOW (ref 4.0–10.5)

## 2016-08-12 LAB — AMYLASE: Amylase: 52 U/L (ref 28–100)

## 2016-08-12 LAB — HIV ANTIBODY (ROUTINE TESTING W REFLEX): HIV Screen 4th Generation wRfx: NONREACTIVE

## 2016-08-12 NOTE — Care Management Note (Signed)
Case Management Note  Patient Details  Name: [name] MRN: [id number] Date of Birth: 1960-08-21  Subjective/Objective:                  Pt admitted with abdominal pain. He is from home, lives alone and has strong family support. Pt is paraplegic and wheelchair bound, he is ind with all ADL's. He plans to return home with self care at DC. He communicates no needs.   Action/Plan: Anticipate DC home today with self care.   Expected Discharge Date:     08/12/2016             Expected Discharge Plan:  Home/Self Care  In-House Referral:  NA  Discharge planning Services  CM Consult  Post Acute Care Choice:  NA Choice offered to:  NA  Status of Service:  Completed, signed off  Malcolm MetroChildress, Salah Burlison Demske, RN 08/12/2016, 11:02 AM

## 2016-08-12 NOTE — Discharge Planning (Signed)
Physician Discharge Summary  [name] QIH:[id number] DOB: 24-Apr-1960 DOA: 08/10/2016  PCP: Oval Linsey, MD  Admit date: 08/10/2016 Discharge date: 08/12/2016   Recommendations for Outpatient Follow-up:  Patient is advised to resume his normal outpatient medicines to eat lightly opioid heavy fat meals and to follow-up in the office in one week's time to check transaminases and any resolution of abdominal discomfort Discharge Diagnoses:  Active Problems:   Abdominal pain   Choledocholithiasis with acute cholecystitis   Discharge Condition: Good  Filed Weights   08/10/16 2000 08/11/16 0205  Weight: 63.5 kg (140 lb) 62 kg (136 lb 9.6 oz)    History of present illness:  The patient is 56-year-old white male paraplegic with 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 Dionicia Abler performed ERCP his transaminitis is started today diminished and he was discharged on his previous hospital medicines he is advised to take Relistor for any possible opioid-induced constipation  Hospital Course:  See history of present illness above  Procedures:  ERCP  Consultations:  Gastroenterology  Discharge Instructions  Discharge Instructions    Discharge instructions    Complete by:  As directed    Discharge patient    Complete by:  As directed    Discharge disposition:  01-Home or Self Care   Discharge patient date:  08/12/2016       Allergies as of 08/12/2016   No Known Allergies     Medication List    TAKE these medications   ANTACID E-X PO Take 4 tablets by mouth once as needed (for indigestion/abdominal pain).   methadone 10 MG tablet Commonly known as:  DOLOPHINE Take 20 mg by mouth 3 (three) times daily.   omeprazole 20 MG capsule Commonly known as:  PRILOSEC Take 1 capsule (20 mg total) by mouth daily. What changed:  when to take this  reasons to take this   pregabalin 75 MG capsule Commonly known as:  LYRICA Take 75 mg by mouth 3 (three) times daily.   RELISTOR 150 MG Tabs Generic drug:  Methylnaltrexone Bromide Take 150 mg by mouth daily.      No Known Allergies    The results of significant diagnostics from this hospitalization (including imaging, microbiology, ancillary and laboratory) are listed below for reference.    Significant Diagnostic Studies: US Abdomen Complete  Result Date: 08/11/2016 CLINICAL DATA:  Elevated transaminases. Chronic abdominal pain. Cholecystectomy . EXAM: ABDOMEN ULTRASOUND COMPLETE COMPARISON:  CT 08/10/2016 . FINDINGS: Gallbladder: Cholecystectomy. Common bile duct: Diameter: 10.8 mm. Liver: No focal lesion identified. Within normal limits in parenchymal echogenicity mild intrahepatic biliary ductal dilatation IVC: No abnormality visualized. Pancreas: Visualized portion unremarkable. Spleen: Size and appearance within normal limits. Right Kidney: Length: 10.7 cm. Echogenicity within normal limits. No mass or hydronephrosis visualized. Left Kidney: Length: 11.1 cm. Echogenicity within normal limits. No mass or hydronephrosis visualized. Abdominal aorta: No aneurysm visualized. Other findings: None. IMPRESSION: 1. Cholecystectomy.  2. Mild intrahepatic and extrahepatic biliary ductal distention. No obstructing lesion identified. Similar findings noted on today's CT. Correlation with liver function tests suggested. MRCP may prove useful for further evaluation .  Electronically Signed   By: Maisie Fus  Register   On: 08/11/2016 10:48   Ct Abdomen Pelvis W Contrast  Result Date: 08/10/2016 CLINICAL DATA:  No bowel movements in the month, abdominal pain EXAM: CT ABDOMEN AND PELVIS WITH CONTRAST TECHNIQUE: Multidetector CT imaging of the abdomen and pelvis was performed using the standard protocol following bolus administration of intravenous contrast. CONTRAST:  ISOVUE-300 IOPAMIDOL (ISOVUE-300) INJECTION 61% COMPARISON:  08/10/2016, CT 03/24/2016, 01/28/2015 FINDINGS: Lower chest: Lung bases demonstrate no acute consolidation or pleural effusion. The heart is nonenlarged. Hepatobiliary: Moderate intra and extrahepatic biliary dilatation post cholecystectomy. Slight increased from January 2018 but similar appearance in 2016. No focal hepatic abnormality. Pancreas: Atrophic.  No inflammatory changes or ductal dilatation. Spleen: Normal in size without focal abnormality. Adrenals/Urinary Tract: Adrenal glands are unremarkable. Kidneys are normal, without renal calculi, focal lesion, or hydronephrosis. Bladder is unremarkable. Stomach/Bowel: The stomach is nonenlarged. No dilated small bowel. No colon wall thickening. Scattered stool in the colon. Vascular/Lymphatic: Aortic atherosclerosis. IVC filter in the infrarenal IVC. No significantly enlarged lymph nodes. Reproductive: Prostate is unremarkable. Other: No free air or free fluid. Musculoskeletal: Proximal left femoral rod. Partially visualized bright decubitus ulcer with thinning of the right inferior pubic ramus, similar compared to prior study. IMPRESSION: 1. Negative for bowel obstruction or bowel wall thickening. There is scattered stool in the colon. 2. Slight increased intra hepatic biliary dilatation since the previous CT but similar appearance on 2016 CT, findings likely related to post cholecystectomy change although could be correlated with laboratory values. 3. Partially visualized right gluteal skin defects  with thinning of underlying right ischial bone and inferior pubic ramus, no changed and likely related to sequela of prior decubitus ulcer and osteomyelitis. Electronically Signed   By: Jasmine Pang M.D.   On: 08/10/2016 23:34   Dg Abd Acute W/chest  Result Date: 08/10/2016 CLINICAL DATA:  Epigastric pain EXAM: DG ABDOMEN ACUTE W/ 1V CHEST COMPARISON:  03/24/2016, FINDINGS: Single-view chest demonstrates posterior spina rod with disrupted first cerclage wires. This is unchanged. No acute infiltrate or effusion. Normal cardiomediastinal silhouette. Supine and upright views of the abdomen demonstrate no free air beneath the diaphragm. There are surgical clips in the right upper quadrant. Non obstructed gas pattern with decreased bowel gas throughout. Aortic atherosclerosis. Advanced arthritis of the hips. IVC filter at L2-L3 level. IMPRESSION: 1. No radiographic evidence for acute cardiopulmonary abnormality. 2. Overall nonobstructed gas pattern with diffusely decreased bowel gas. Electronically Signed   By: Jasmine Pang M.D.   On: 08/10/2016 21:54   Dg C-arm 1-60 Min-no Report  Result Date: 08/11/2016 Fluoroscopy was utilized by the requesting physician.  No radiographic interpretation.    Microbiology: Recent Results (from the past 240 hour(s))  MRSA PCR Screening     Status: None   Collection Time: 08/11/16  2:40 AM  Result Value Ref Range Status   MRSA by PCR NEGATIVE NEGATIVE Final    Comment:        The GeneXpert MRSA Assay (FDA approved for NASAL specimens only), is one component of a comprehensive MRSA colonization surveillance program. It is not intended to diagnose MRSA infection nor to guide or monitor treatment for MRSA infections.      Labs: Basic Metabolic Panel:  Recent Labs Lab 08/10/16 2108 08/11/16 0453  NA 137 139  K 3.8 3.9  CL 102 103  CO2 26 29  GLUCOSE 189* 115*  BUN 10 6  CREATININE 0.55* 0.45*  CALCIUM 8.9 8.9   Liver Function Tests:  Recent  Labs Lab 08/10/16 2108 08/11/16 0453 08/11/16 1908 08/12/16 0526  AST 407* 411* 171* 101*  ALT 181* 254* 199* 148*  ALKPHOS 244* 256* 251* 211*  BILITOT 1.5* 2.5* 3.6* 2.3*  PROT 8.4* 7.3 7.1 6.4*  ALBUMIN 3.8 3.3* 3.1* 2.8*    Recent Labs Lab 08/10/16 2108 08/12/16 0526  LIPASE 17  --   AMYLASE  --  52   No results for input(s): AMMONIA in the last 168 hours. CBC:  Recent Labs Lab 08/10/16 2108 08/11/16 0453 08/12/16 0526  WBC 9.9 10.3 3.8*  NEUTROABS 9.0*  --   --   HGB 13.0 11.9* 10.3*  HCT 39.2 36.3* 32.2*  MCV 80.3 80.0 81.7  PLT 285 272 212   Cardiac Enzymes: No results for input(s): CKTOTAL, CKMB, CKMBINDEX, TROPONINI in the last 168 hours. BNP: BNP (last 3 results) No results for input(s): BNP in the last 8760 hours.  ProBNP (last 3 results) No results for input(s): PROBNP in the last 8760 hours.  CBG: No results for input(s): GLUCAP in the last 168 hours.     Signed:  Zamya Culhane Judie PetitM  [facility] Pager: [phone] 08/12/2016, 1:11 PM

## 2016-08-12 NOTE — Addendum Note (Signed)
Addendum  created 08/12/16 1156 by Franco NonesYates, Lauralye Kinn S, CRNA   Sign clinical note

## 2016-08-12 NOTE — Progress Notes (Signed)
Pt given snack per Dr. Karilyn Cotaehman order, pt tolerated well.

## 2016-08-12 NOTE — Op Note (Addendum)
[facility] Patient Name: [name] Procedure Date: 08/11/2016 1:37 PM MRN: [id number] Date of Birth: 10-Jun-1960 Attending MD: Lionel December , MD CSN: [id number] Age: 56 Admit Type: Inpatient Procedure:                ERCP Indications:              Abdominal pain of suspected biliary origin, Biliary                            dilation on Computed Tomogram Scan, Elevated liver                            enzymes Providers:                Lionel December, MD, Jannett Celestine, RN, Edrick Kins, RN Referring MD:             Duke Salvia. Dondiego, MD Medicines:                General Anesthesia Complications:            No immediate complications. Estimated Blood Loss:     Estimated blood loss: none. Procedure:                Pre-Anesthesia Assessment:                           - Prior to the procedure, a History and Physical                            was performed, and patient medications and                            allergies were reviewed. The patient's tolerance of                            previous anesthesia was also reviewed. The risks                            and benefits of the procedure and the sedation                            options and risks were discussed with the patient.                            All questions were answered, and informed consent                            was obtained. Prior Anticoagulants: The patient has                            taken no previous anticoagulant or antiplatelet                            agents. ASA Grade Assessment: III - A patient with  severe systemic disease. After reviewing the risks                            and benefits, the patient was deemed in                            satisfactory condition to undergo the procedure.                           After obtaining informed consent, the scope was                            passed under direct vision. Throughout the   procedure, the patient's blood pressure, pulse, and                            oxygen saturations were monitored continuously. The                            [id number] ([id number]) scope was introduced through                            the mouth, and used to inject contrast into and                            used to inject contrast into the bile duct. The                            ERCP was accomplished without difficulty. The                            patient tolerated the procedure well. Scope In: 2:24:48 PM Scope Out: 2:50:20 PM Total Procedure Duration: 0 hours 25 minutes 32 seconds  Findings:      The scout film was normal. The esophagus was successfully intubated       under direct vision. The scope was advanced to a normal major papilla in       the descending duodenum without detailed examination of the pharynx,       larynx and associated structures, and upper GI tract. The upper GI tract       was grossly normal. The bile duct was deeply cannulated with the       Autotome sphincterotome. Contrast was injected. I personally interpreted       the bile duct images. There was brisk flow of contrast through the       ducts. Image quality was excellent. Contrast extended to the main bile       duct. Contrast extended to the bifurcation. Contrast extended to the       hepatic ducts. The main bile duct was mildly dilated, acquired. The       largest diameter was 9 mm. The biliary tree was otherwise normal.       Inspection of the major papilla revealed that an ampullectomy       (papillectomy) had been performed previously. A 5 mm biliary       sphincterotomy was made with a braided Autotome sphincterotome using  ERBE electrocautery. There was no post-sphincterotomy bleeding. To       determine the sphincterotomy size, the biliary tree was swept with a 10       mm balloon starting at the bifurcation. Impression:               - Prior endoscopic papillectomy                            - The entire main bile duct was mildly dilated,                            acquired. no stones noted.                           - A biliary sphincterotomy as extended and 10 mm                            balloon passed through the bile duct with removal                            of sludge but no stones.                           Comment: Suspect patient passed stones                            spontaneously given his presentation. Moderate Sedation:      Per Anesthesia Care Recommendation:           - Avoid aspirin and nonsteroidal anti-inflammatory                            medicines for 3 days.                           - Clear liquid diet today.                           - Continue present medications. Procedure Code(s):        --- Professional ---                           [phone], Endoscopic retrograde                            cholangiopancreatography (ERCP); with                            sphincterotomy/papillotomy Diagnosis Code(s):        --- Professional ---                           K83.8, Other specified diseases of biliary tract                           R10.9, Unspecified abdominal pain  R74.8, Abnormal levels of other serum enzymes CPT copyright 2016 [employer]. All rights reserved. The codes documented in this report are preliminary and upon coder review may  be revised to meet current compliance requirements. Lionel December, MD Lionel December, MD 08/12/2016 7:27:32 AM This report has been signed electronically. Number of Addenda: 0

## 2016-08-12 NOTE — Progress Notes (Signed)
Patient ID: [name], male   DOB: 1960-08-26, 56 y.o.   MRN: [id number] Voices no complaints. Denies any abdominal pain. Feel much better. Underwent ERCP yesterday. May be discharged from our stand point. Needs Hepatic function in 2 weeks.

## 2016-08-12 NOTE — Progress Notes (Signed)
Discharge instructions gone over with patient, verbalized understanding. IV removed, patient tolerated procedure well. 

## 2016-08-12 NOTE — Anesthesia Postprocedure Evaluation (Signed)
Anesthesia Post Note  Patient: [name]  Procedure(s) Performed: Procedure(s) (LRB): ENDOSCOPIC RETROGRADE CHOLANGIOPANCREATOGRAPHY (ERCP) extended Sphincterotomy (N/A) BALLOON DILATION (N/A)  Patient location during evaluation: Nursing Unit Anesthesia Type: General Level of consciousness: awake and alert Pain management: satisfactory to patient Vital Signs Assessment: post-procedure vital signs reviewed and stable Respiratory status: spontaneous breathing Cardiovascular status: stable Anesthetic complications: no     Last Vitals:  Vitals:   08/11/16 2257 08/12/16 0619  BP: 121/73 131/88  Pulse: 61 65  Resp: 20 20  Temp: 36.3 C 36.6 C    Last Pain:  Vitals:   08/12/16 0619  TempSrc: Oral  PainSc:                  Minerva AreolaYATES,Sanayah Munro

## 2016-08-12 NOTE — Addendum Note (Signed)
Addendum  created 08/12/16 0730 by Franco NonesYates, Linh Hedberg S, CRNA   Charge Capture section accepted

## 2016-08-18 ENCOUNTER — Telehealth (INDEPENDENT_AMBULATORY_CARE_PROVIDER_SITE_OTHER): Payer: Self-pay | Admitting: Internal Medicine

## 2016-08-18 NOTE — Telephone Encounter (Signed)
Patient called and stated that Dr. Karilyn Cotaehman recently did a procedure on him in the hospital.  He states that he's not having the constant pain in his chest, but he states that he can't describe the feeling he is having, but he feels like something is going to happen to him in that area again, he's really afraid and scared about this.  Stated that he is regurgitating his food.  [phone]

## 2016-08-20 ENCOUNTER — Other Ambulatory Visit (INDEPENDENT_AMBULATORY_CARE_PROVIDER_SITE_OTHER): Payer: Self-pay | Admitting: *Deleted

## 2016-08-20 DIAGNOSIS — R1084 Generalized abdominal pain: Secondary | ICD-10-CM

## 2016-08-20 DIAGNOSIS — R112 Nausea with vomiting, unspecified: Secondary | ICD-10-CM

## 2016-08-20 NOTE — Telephone Encounter (Signed)
Per Dr.Rehman  - the pain medication that the patient is taking may be causing some of his symptoms. Patient will need to have LFT drawn.  Patient was called and made aware. He plans to get these done today.

## 2016-08-26 ENCOUNTER — Telehealth (INDEPENDENT_AMBULATORY_CARE_PROVIDER_SITE_OTHER): Payer: Self-pay | Admitting: Internal Medicine

## 2016-08-26 NOTE — Telephone Encounter (Signed)
Patient called and stated that he called some time ago and stated that he had a procedure done and he is still not feeling good. He stated that he hasn't heard back from us.  He stated that something is wrong with him and he wants us to call him and tell him what he needs to do to feel better.  [phone]

## 2016-08-26 NOTE — Telephone Encounter (Signed)
Call was addressed with the patient. He ws in his PCP office when I called him. I gave him Dr.Rehman's recommendation. He was also advised that the pain medication may be causing his downiness.  Patient may need OV to be reevaluated.

## 2016-09-06 ENCOUNTER — Encounter (HOSPITAL_COMMUNITY): Payer: Self-pay | Admitting: Internal Medicine

## 2016-09-14 ENCOUNTER — Encounter: Payer: Self-pay | Admitting: Family Medicine

## 2016-09-14 ENCOUNTER — Ambulatory Visit (INDEPENDENT_AMBULATORY_CARE_PROVIDER_SITE_OTHER): Payer: Medicare Other | Admitting: Family Medicine

## 2016-09-14 DIAGNOSIS — G894 Chronic pain syndrome: Secondary | ICD-10-CM

## 2016-09-14 NOTE — Progress Notes (Signed)
[name] came to the office to see about changing his PCP His health history is reviewed His medicine list is reviewed  He is on chronic methadone for pain.  I advised him I do not do chronic pain management He feels he needs pain medicine to 'live" He stated his visit here was a "waste of his time"  He will stay with his current medical provider

## 2016-09-16 ENCOUNTER — Encounter (INDEPENDENT_AMBULATORY_CARE_PROVIDER_SITE_OTHER): Payer: Self-pay | Admitting: Internal Medicine

## 2016-09-16 ENCOUNTER — Encounter (INDEPENDENT_AMBULATORY_CARE_PROVIDER_SITE_OTHER): Payer: Self-pay | Admitting: *Deleted

## 2016-09-16 ENCOUNTER — Ambulatory Visit (INDEPENDENT_AMBULATORY_CARE_PROVIDER_SITE_OTHER): Payer: Medicare Other | Admitting: Internal Medicine

## 2016-09-16 VITALS — BP 100/60 | HR 64 | Temp 97.8°F | Ht 67.0 in | Wt 130.0 lb

## 2016-09-16 DIAGNOSIS — R1013 Epigastric pain: Secondary | ICD-10-CM | POA: Diagnosis not present

## 2016-09-16 DIAGNOSIS — K8050 Calculus of bile duct without cholangitis or cholecystitis without obstruction: Secondary | ICD-10-CM | POA: Diagnosis not present

## 2016-09-16 LAB — HEPATIC FUNCTION PANEL
ALK PHOS: 105 U/L (ref 40–115)
ALT: 9 U/L (ref 9–46)
AST: 14 U/L (ref 10–35)
Albumin: 3.6 g/dL (ref 3.6–5.1)
BILIRUBIN DIRECT: 0.1 mg/dL (ref <=0.2)
BILIRUBIN TOTAL: 0.3 mg/dL (ref 0.2–1.2)
Indirect Bilirubin: 0.2 mg/dL (ref 0.2–1.2)
Total Protein: 7.1 g/dL (ref 6.1–8.1)

## 2016-09-16 NOTE — Progress Notes (Signed)
Subjective:    Patient ID: [name], male    DOB: [birth year], 56 y.o.   MRN: [id number]  HPI   Recently admitted to [facility] in June with upper abdominal pain. Had elevated liver enzymes. Suspected CBD stone. Underwent and ERCP: Impression:               - Prior endoscopic papillectomy                           - The entire main bile duct was mildly dilated,                            acquired. no stones noted.                           - A biliary sphincterotomy as extended and 10 mm                            balloon passed through the bile duct with removal                            of sludge but no stones.                           Comment: Suspect patient passed stones                            spontaneously given his presentation. Pian in his epigastric region. He says he is having acid reflux. He has been taking Omeprazole 20mg  daily which he says is not helping. He states he is not having severe pain. He has some cramping in his epigastric region.  Symptoms for about a month since his procedure. Appetite is good. BM x 1 with a laxative. BM about every 3 days.    Review of Systems Past Medical History:  Diagnosis Date  . Anemia    Patient states that he was recently told this  . Anxiety   . Arthritis   . Blood transfusion without reported diagnosis   . Depression   . GERD (gastroesophageal reflux disease)   . Hepatitis C   . MRSA infection   . Neuromuscular disorder (HCC)   . Osteomyelitis (HCC)   . Paralysis (HCC)   . Paraparesis (HCC)    parapalegic   . 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  . Substance abuse    drinking,     Past Surgical History:  Procedure Laterality Date  . BACK SURGERY    . BALLOON DILATION N/A 08/11/2016   Procedure: BALLOON DILATION;  Surgeon: Malissa Hippo, MD;  Location: [facility];  Service: Endoscopy;  Laterality: N/A;  . CHOLECYSTECTOMY    . ERCP N/A 08/11/2016   Procedure: ENDOSCOPIC  RETROGRADE CHOLANGIOPANCREATOGRAPHY (ERCP) extended Sphincterotomy;  Surgeon: Malissa Hippo, MD;  Location: [facility];  Service: Endoscopy;  Laterality: N/A;  . FEMUR FRACTURE SURGERY  March 2012   retrograde intramedullary femoral nail  . FRACTURE SURGERY     broke back, fusion  . GALLBLADDER SURGERY  2010  . HERNIA REPAIR     very young  . ORIF FEMUR FRACTURE Right  08/04/2012   Procedure: OPEN REDUCTION INTERNAL FIXATION (ORIF) DISTAL FEMUR FRACTURE;  Surgeon: Eldred MangesMark C Yates, MD;  Location: [facility] OR;  Service: Orthopedics;  Laterality: Right;  Right Retrograde Supracondylar Nail Right Femur    No Known Allergies  Current Outpatient Prescriptions on File Prior to Visit  Medication Sig Dispense Refill  . Calcium Carbonate Antacid (ANTACID E-X PO) Take 4 tablets by mouth once as needed (for indigestion/abdominal pain).    . methadone (DOLOPHINE) 10 MG tablet Take 20 mg by mouth 3 (three) times daily.    . Methylnaltrexone Bromide (RELISTOR) 150 MG TABS Take 150 mg by mouth daily.    [name]. omeprazole (PRILOSEC) 20 MG capsule Take 1 capsule (20 mg total) by mouth daily. (Patient taking differently: Take 20 mg by mouth as needed (indigestion). ) 30 capsule 0  . pregabalin (LYRICA) 75 MG capsule Take 75 mg by mouth 3 (three) times daily.      No current facility-administered medications on file prior to visit.         Objective:   Physical Exam Blood pressure 100/60, pulse 64, temperature 97.8 F (36.6 C), height 5\' 7"  (1.702 m), weight 130 lb (59 kg).  (patient examined from W/C.). Weight was stated.   Alert and oriented. Skin warm and dry. Oral mucosa is moist.   . Sclera anicteric, conjunctivae is pink. Thyroid not enlarged. No cervical lymphadenopathy. Lungs clear. Heart regular rate and rhythm.  Abdomen is soft. Bowel sounds are positive. No hepatomegaly. No abdominal masses felt. Epigastric tenderness.  No edema to lower extremities. Patient is alert and oriented.       Assessment & Plan:   Epigastric pain.  Continue the Prilosec daily.  Common bile duct stone: will get US and Hepatic.

## 2016-09-16 NOTE — Patient Instructions (Signed)
Labs and US. 

## 2016-09-20 ENCOUNTER — Ambulatory Visit (HOSPITAL_COMMUNITY)
Admission: RE | Admit: 2016-09-20 | Discharge: 2016-09-20 | Disposition: A | Discharge status: Home / Self Care | Payer: Medicare Other | Source: Ambulatory Visit | Attending: Internal Medicine | Admitting: Internal Medicine

## 2016-09-20 DIAGNOSIS — R1013 Epigastric pain: Secondary | ICD-10-CM | POA: Diagnosis not present

## 2016-09-20 DIAGNOSIS — Z9049 Acquired absence of other specified parts of digestive tract: Secondary | ICD-10-CM | POA: Diagnosis not present

## 2016-10-15 NOTE — Discharge Summary (Signed)
Physician Discharge Summary  Madaline BrilliantDerek Winnick ZHY:[id number] DOB: Aug 30, 1960 DOA: 08/10/2016  PCP: Oval Linseyondiego, Kiylee Thoreson, MD  Admit date: 08/10/2016 Discharge date: 10/15/2016   Recommendations for Outpatient Follow-up:  Patient is instructed to take his proton pump inhibitor twice a day to avoid fatty meals and to follow-up my office within 5-7 days time to assess liver function as well as symptomatology from choledocholithiasis Discharge Diagnoses:  Active Problems:   Abdominal pain   Choledocholithiasis with acute cholecystitis   Discharge Condition: Good  Filed Weights   08/10/16 2000 08/11/16 0205  Weight: 63.5 kg (140 lb) 62 kg (136 lb 9.6 oz)    History of present illness:  Patient is referred to white 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 is status post cholecystectomy who presented with increased liver function tests and right upper quadrant epigastric pain CT scan as well as abdominal sonogram revealed dilated common bile duct who felt that he may choledocholithiasis or torsion of this vessel was seen in consultation by gastroenterology who performed an ERCP there was mostly sludge from the common bile duct this obstruction was relieved and his liver function tests improved significantly and this 2 days subsequent patient desired to go home was felt he could be followed up safely as an outpatient  Hospital Course:   Procedures:  ERCP  Consultations:  Gastroenterology  Discharge Instructions  Discharge Instructions    Discharge instructions    Complete by:  As directed    Discharge patient    Complete by:  As directed    Discharge disposition:  01-Home or Self Care   Discharge patient date:  08/12/2016     Allergies as of 08/12/2016   No Known Allergies     Medication List    TAKE these medications   ANTACID E-X PO Take 4 tablets by mouth once as needed (for indigestion/abdominal pain).   methadone 10 MG tablet Commonly known as:   DOLOPHINE Take 20 mg by mouth 3 (three) times daily.   omeprazole 20 MG capsule Commonly known as:  PRILOSEC Take 1 capsule (20 mg total) by mouth daily. What changed:  when to take this  reasons to take this   pregabalin 75 MG capsule Commonly known as:  LYRICA Take 75 mg by mouth 3 (three) times daily.   RELISTOR 150 MG Tabs Generic drug:  Methylnaltrexone Bromide Take 150 mg by mouth daily.      No Known Allergies    The results of significant diagnostics from this hospitalization (including imaging, microbiology, ancillary and laboratory) are listed below for reference.    Significant Diagnostic Studies: [place] Abdomen Complete  Result Date: 09/20/2016 CLINICAL DATA:  Chronic epigastric pain.  HCV positive. EXAM: ABDOMEN ULTRASOUND COMPLETE COMPARISON:  08/11/2016 right upper quadrant ultrasound FINDINGS: Gallbladder: Surgically absent Common bile duct: Diameter: 4 mm Liver: No focal lesion identified. Within normal limits in parenchymal echogenicity. Antegrade flow in the imaged hepatic and portal venous system. IVC: No abnormality visualized. Pancreas: Essentially not visualized. Spleen: Size and appearance within normal limits as permitted by intermittent rib shadowing. Right Kidney: Length: 11 cm. Echogenicity within normal limits. No mass or hydronephrosis visualized. Left Kidney: Length: 11 cm. Suboptimal visualization due to narrow sonographic windows from bowel gas. Echogenicity within normal limits. No mass or hydronephrosis visualized. Abdominal aorta: No aneurysm visualized. IMPRESSION: 1. No explanation for abdominal pain. 2. Cholecystectomy. 3. Nonvisualized pancreas. Electronically Signed   By: Marnee SpringJonathon  Watts M.D.   On: 09/20/2016 11:16  Microbiology: No results found for this or any previous visit (from the past 240 hour(s)).   Labs: Basic Metabolic Panel: No results for input(s): NA, K, CL, CO2, GLUCOSE, BUN, CREATININE, CALCIUM, MG, PHOS in the last 168  hours. Liver Function Tests: No results for input(s): AST, ALT, ALKPHOS, BILITOT, PROT, ALBUMIN in the last 168 hours. No results for input(s): LIPASE, AMYLASE in the last 168 hours. No results for input(s): AMMONIA in the last 168 hours. CBC: No results for input(s): WBC, NEUTROABS, HGB, HCT, MCV, PLT in the last 168 hours. Cardiac Enzymes: No results for input(s): CKTOTAL, CKMB, CKMBINDEX, TROPONINI in the last 168 hours. BNP: BNP (last 3 results) No results for input(s): BNP in the last 8760 hours.  ProBNP (last 3 results) No results for input(s): PROBNP in the last 8760 hours.  CBG: No results for input(s): GLUCAP in the last 168 hours.     Signed:  Kendarius Vigen Judie Petit  [facility] Pager: [phone] 10/15/2016, 1:20 PM

## 2016-10-30 ENCOUNTER — Encounter (HOSPITAL_COMMUNITY): Payer: Self-pay | Admitting: Emergency Medicine

## 2016-10-30 ENCOUNTER — Emergency Department (HOSPITAL_COMMUNITY)
Admission: EM | Admit: 2016-10-30 | Discharge: 2016-10-30 | Disposition: A | Discharge status: Home / Self Care | Payer: Medicare Other | Attending: Emergency Medicine | Admitting: Emergency Medicine

## 2016-10-30 ENCOUNTER — Emergency Department (HOSPITAL_COMMUNITY): Payer: Medicare Other

## 2016-10-30 DIAGNOSIS — S6991XA Unspecified injury of right wrist, hand and finger(s), initial encounter: Secondary | ICD-10-CM | POA: Diagnosis present

## 2016-10-30 DIAGNOSIS — Z79899 Other long term (current) drug therapy: Secondary | ICD-10-CM | POA: Insufficient documentation

## 2016-10-30 DIAGNOSIS — Y92007 Garden or yard of unspecified non-institutional (private) residence as the place of occurrence of the external cause: Secondary | ICD-10-CM | POA: Diagnosis not present

## 2016-10-30 DIAGNOSIS — X500XXA Overexertion from strenuous movement or load, initial encounter: Secondary | ICD-10-CM | POA: Insufficient documentation

## 2016-10-30 DIAGNOSIS — S63501A Unspecified sprain of right wrist, initial encounter: Secondary | ICD-10-CM | POA: Insufficient documentation

## 2016-10-30 DIAGNOSIS — Z87891 Personal history of nicotine dependence: Secondary | ICD-10-CM | POA: Insufficient documentation

## 2016-10-30 DIAGNOSIS — Y9389 Activity, other specified: Secondary | ICD-10-CM | POA: Insufficient documentation

## 2016-10-30 DIAGNOSIS — Y999 Unspecified external cause status: Secondary | ICD-10-CM | POA: Insufficient documentation

## 2016-10-30 MED ORDER — KETOROLAC TROMETHAMINE 60 MG/2ML IM SOLN
60.0000 mg | Freq: Once | INTRAMUSCULAR | Status: AC
  Administered 2016-10-30: 60 mg via INTRAMUSCULAR
  Filled 2016-10-30: qty 2

## 2016-10-30 MED ORDER — IBUPROFEN 600 MG PO TABS: 600.0000 mg | ORAL_TABLET | Freq: Four times a day (QID) | ORAL | 0 refills | Status: DC | PRN

## 2016-10-30 NOTE — Discharge Instructions (Signed)
Use ice and elevation and wear the splint as much as is comfortable to rest your wrist.  Your xrays are negative for acute fracture but you do have arthritic changes in your wrist from your old injury.  Call Dr Romeo Apple for further evaluation if your symptoms persist beyond the next week.

## 2016-10-30 NOTE — ED Notes (Signed)
Patient transported to X-ray 

## 2016-10-30 NOTE — ED Triage Notes (Signed)
Pt reports right wrist/hand pain after using a leaf blower yesterday.

## 2016-10-30 NOTE — ED Provider Notes (Signed)
[facility] Provider Note   CSN: [id number] Arrival date & time: 10/30/16  1716     History   Chief Complaint Chief Complaint  Patient presents with  . Hand Injury    HPI [name] is a 55 y.o. male with past medical history as outlined below and on methadone for treatment of chronic pain presenting with constant right wrist pain and swelling since using a heavy leaf blower to clear his driveway yesterday. He denies radiation of pain. He reports prior distant wrist fracture and feels he may have refractured the wrist.  He denies specific injury, slowly developed increased pain and swelling after the overuse last night. Besides his methadone he has used excedrin without relief of pain.  The history is provided by the patient.    Past Medical History:  Diagnosis Date  . Anemia    Patient states that he was recently told this  . Anxiety   . Arthritis   . Blood transfusion without reported diagnosis   . Depression   . GERD (gastroesophageal reflux disease)   . Hepatitis C   . MRSA infection   . Neuromuscular disorder (HCC)   . Osteomyelitis (HCC)   . Paralysis (HCC)   . Paraparesis (HCC)    parapalegic   . 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  . Substance abuse    drinking,     Patient Active Problem List   Diagnosis Date Noted  . Pain syndrome, chronic 09/14/2016  . Choledocholithiasis with acute cholecystitis 08/11/2016  . Rectal bleeding 06/01/2016  . Abdominal pain 01/28/2015  . Hepatitis C 05/17/2013  . Closed supracondylar fracture of right femur (HCC) 09/25/2012    Class: Acute  . Right radial head fracture 07/05/2012  . Chronic hepatitis C (HCC) 05/10/2011  . Decubitus ulcer 05/10/2011  . Femur fracture, left (HCC) 06/30/2010  . Paraplegia (HCC) 06/30/2010  . CALCU BD WITHOUT MENTION CHOLECYST/OBSTRUCTION 05/22/2008  . CLOSED FRACTURE OF SHAFT OF TIBIA 03/22/2007    Past Surgical History:    Procedure Laterality Date  . BACK SURGERY    . BALLOON DILATION N/A 08/11/2016   Procedure: BALLOON DILATION;  Surgeon: Malissa Hippo, MD;  Location: [facility];  Service: Endoscopy;  Laterality: N/A;  . CHOLECYSTECTOMY    . ERCP N/A 08/11/2016   Procedure: ENDOSCOPIC RETROGRADE CHOLANGIOPANCREATOGRAPHY (ERCP) extended Sphincterotomy;  Surgeon: Malissa Hippo, MD;  Location: [facility];  Service: Endoscopy;  Laterality: N/A;  . FEMUR FRACTURE SURGERY  March 2012   retrograde intramedullary femoral nail  . FRACTURE SURGERY     broke back, fusion  . GALLBLADDER SURGERY  2010  . HERNIA REPAIR     very young  . ORIF FEMUR FRACTURE Right 08/04/2012   Procedure: OPEN REDUCTION INTERNAL FIXATION (ORIF) DISTAL FEMUR FRACTURE;  Surgeon: Eldred Manges, MD;  Location: [facility] OR;  Service: Orthopedics;  Laterality: Right;  Right Retrograde Supracondylar Nail Right Femur       Home Medications    Prior to Admission medications   Medication Sig Start Date End Date Taking? Authorizing Provider  Calcium Carbonate Antacid (ANTACID E-X PO) Take 4 tablets by mouth once as needed (for indigestion/abdominal pain).    [provider]  ibuprofen (ADVIL,MOTRIN) 600 MG tablet Take 1 tablet (600 mg total) by mouth every 6 (six) hours as needed. 10/30/16   Burgess Amor, PA-C  methadone (DOLOPHINE) 10 MG tablet Take 20 mg by mouth 3 (three)  times daily.    [provider]  Methylnaltrexone Bromide (RELISTOR) 150 MG TABS Take 150 mg by mouth daily.    [provider]  omeprazole (PRILOSEC) 20 MG capsule Take 1 capsule (20 mg total) by mouth daily. Patient taking differently: Take 20 mg by mouth as needed (indigestion).  03/24/16   Horton, Mayer Masker, MD  pregabalin (LYRICA) 75 MG capsule Take 75 mg by mouth 3 (three) times daily.     [provider]    Family History Family History  Problem Relation Age of Onset  . Ovarian cancer Mother   . HIV Sister   . Healthy Sister   . Hodgkin's  lymphoma Sister   . Heart disease Sister   . Healthy Sister   . Healthy Son   . Healthy Daughter   . Anemia Daughter   . Thyroid disease Daughter   . Cancer Unknown        family history     Social History Social History  Substance Use Topics  . Smoking status: Former Smoker    Packs/day: 1.00    Years: 5.00    Types: Cigarettes    Quit date: 08/10/2010  . Smokeless tobacco: Never Used  . Alcohol use No     Comment: Patient quit drinking 1 year ago     Allergies   Patient has no known allergies.   Review of Systems Review of Systems  Constitutional: Negative for fever.  Musculoskeletal: Positive for arthralgias and joint swelling. Negative for myalgias.  Neurological: Negative for weakness and numbness.     Physical Exam Updated Vital Signs BP (!) 143/94   Pulse 88   Temp 98 F (36.7 C) (Oral)   Resp 18   Ht 5\' 9"  (1.753 m)   Wt 59 kg (130 lb)   SpO2 97%   BMI 19.20 kg/m   Physical Exam  Constitutional: He appears well-developed and well-nourished.  HENT:  Head: Atraumatic.  Neck: Normal range of motion.  Cardiovascular:  Pulses equal bilaterally  Musculoskeletal: He exhibits tenderness. He exhibits no edema.       Right wrist: He exhibits bony tenderness and swelling.  Pt is ttp dorsal medial and lateral wrist with edema noted.  Skin intact, less than 2 sec cap refill in fingertips.  No erythema. No palpable deformity.  Neurological: He is alert. He has normal strength. He displays normal reflexes. No sensory deficit.  Skin: Skin is warm and dry.  Psychiatric: He has a normal mood and affect.     ED Treatments / Results  Labs (all labs ordered are listed, but only abnormal results are displayed) Labs Reviewed - No data to display  EKG  EKG Interpretation None       Radiology Dg Wrist Complete Right  Result Date: 10/30/2016 CLINICAL DATA:  Right wrist pain following yard work, initial encounter EXAM: RIGHT WRIST - COMPLETE 3+ VIEW  COMPARISON:  None. FINDINGS: Irregularity in the distal radial diaphysis is noted likely related to prior fracture and healing. Some degenerative changes in the carpal bones are seen. No acute bony abnormality is noted. IMPRESSION: Chronic posttraumatic changes.  No acute abnormality noted. Electronically Signed   By: Alcide Clever M.D.   On: 10/30/2016 18:04    Procedures Procedures (including critical care time)  Medications Ordered in ED Medications  ketorolac (TORADOL) injection 60 mg (not administered)     Initial Impression / Assessment and Plan / ED Course  I have reviewed the triage vital signs  and the nursing notes.  Pertinent labs & imaging results that were available during my care of the patient were reviewed by me and considered in my medical decision making (see chart for details).     Pt with wrist pain and swelling, no fracture, suspect strain/sprain from overuse. RICE, f/u with Dr. Romeo Apple prn.  Final Clinical Impressions(s) / ED Diagnoses   Final diagnoses:  Wrist sprain, right, initial encounter    New Prescriptions New Prescriptions   IBUPROFEN (ADVIL,MOTRIN) 600 MG TABLET    Take 1 tablet (600 mg total) by mouth every 6 (six) hours as needed.     Burgess Amor, PA-C 10/30/16 1859    Samuel Jester, DO 10/30/16 2114

## 2017-02-07 ENCOUNTER — Telehealth (HOSPITAL_COMMUNITY): Payer: Self-pay

## 2017-03-17 ENCOUNTER — Encounter (HOSPITAL_COMMUNITY): Payer: Self-pay | Admitting: Emergency Medicine

## 2017-03-17 ENCOUNTER — Emergency Department (HOSPITAL_COMMUNITY): Payer: Medicare Other

## 2017-03-17 ENCOUNTER — Emergency Department (HOSPITAL_COMMUNITY)
Admission: EM | Admit: 2017-03-17 | Discharge: 2017-03-17 | Disposition: A | Discharge status: Home / Self Care | Payer: Medicare Other | Attending: Emergency Medicine | Admitting: Emergency Medicine

## 2017-03-17 ENCOUNTER — Other Ambulatory Visit: Payer: Self-pay

## 2017-03-17 DIAGNOSIS — R000 Tachycardia, unspecified: Secondary | ICD-10-CM | POA: Insufficient documentation

## 2017-03-17 DIAGNOSIS — G8220 Paraplegia, unspecified: Secondary | ICD-10-CM | POA: Diagnosis not present

## 2017-03-17 DIAGNOSIS — R5383 Other fatigue: Secondary | ICD-10-CM | POA: Insufficient documentation

## 2017-03-17 DIAGNOSIS — R509 Fever, unspecified: Secondary | ICD-10-CM | POA: Insufficient documentation

## 2017-03-17 DIAGNOSIS — N3000 Acute cystitis without hematuria: Secondary | ICD-10-CM | POA: Insufficient documentation

## 2017-03-17 DIAGNOSIS — G8929 Other chronic pain: Secondary | ICD-10-CM | POA: Diagnosis not present

## 2017-03-17 DIAGNOSIS — M79604 Pain in right leg: Secondary | ICD-10-CM | POA: Insufficient documentation

## 2017-03-17 DIAGNOSIS — Y33XXXS Other specified events, undetermined intent, sequela: Secondary | ICD-10-CM | POA: Insufficient documentation

## 2017-03-17 DIAGNOSIS — S31819S Unspecified open wound of right buttock, sequela: Secondary | ICD-10-CM | POA: Diagnosis not present

## 2017-03-17 DIAGNOSIS — Z87891 Personal history of nicotine dependence: Secondary | ICD-10-CM | POA: Insufficient documentation

## 2017-03-17 LAB — CBC WITH DIFFERENTIAL/PLATELET
Basophils Absolute: 0 10*3/uL (ref 0.0–0.1)
Basophils Relative: 0 %
Eosinophils Absolute: 0 10*3/uL (ref 0.0–0.7)
Eosinophils Relative: 0 %
HEMATOCRIT: 36.2 % — AB (ref 39.0–52.0)
HEMOGLOBIN: 11.3 g/dL — AB (ref 13.0–17.0)
LYMPHS ABS: 0.8 10*3/uL (ref 0.7–4.0)
Lymphocytes Relative: 5 %
MCH: 24.9 pg — AB (ref 26.0–34.0)
MCHC: 31.2 g/dL (ref 30.0–36.0)
MCV: 79.7 fL (ref 78.0–100.0)
MONO ABS: 1.5 10*3/uL — AB (ref 0.1–1.0)
MONOS PCT: 10 %
NEUTROS ABS: 12.6 10*3/uL — AB (ref 1.7–7.7)
NEUTROS PCT: 85 %
Platelets: 342 10*3/uL (ref 150–400)
RBC: 4.54 MIL/uL (ref 4.22–5.81)
RDW: 14.3 % (ref 11.5–15.5)
WBC: 14.9 10*3/uL — ABNORMAL HIGH (ref 4.0–10.5)

## 2017-03-17 LAB — URINALYSIS, ROUTINE W REFLEX MICROSCOPIC
Glucose, UA: NEGATIVE mg/dL
HGB URINE DIPSTICK: NEGATIVE
Ketones, ur: 5 mg/dL — AB
NITRITE: NEGATIVE
PROTEIN: NEGATIVE mg/dL
Specific Gravity, Urine: 1.024 (ref 1.005–1.030)
pH: 6 (ref 5.0–8.0)

## 2017-03-17 LAB — COMPREHENSIVE METABOLIC PANEL
ALBUMIN: 3.3 g/dL — AB (ref 3.5–5.0)
ALT: 9 U/L — ABNORMAL LOW (ref 17–63)
ANION GAP: 13 (ref 5–15)
AST: 19 U/L (ref 15–41)
Alkaline Phosphatase: 86 U/L (ref 38–126)
BUN: 10 mg/dL (ref 6–20)
CHLORIDE: 95 mmol/L — AB (ref 101–111)
CO2: 24 mmol/L (ref 22–32)
Calcium: 8.4 mg/dL — ABNORMAL LOW (ref 8.9–10.3)
Creatinine, Ser: 0.63 mg/dL (ref 0.61–1.24)
GFR calc Af Amer: >60 mL/min (ref >60)
GFR calc non Af Amer: >60 mL/min (ref >60)
GLUCOSE: 93 mg/dL (ref 65–99)
Potassium: 3.4 mmol/L — ABNORMAL LOW (ref 3.5–5.1)
SODIUM: 132 mmol/L — AB (ref 135–145)
Total Bilirubin: 0.5 mg/dL (ref 0.3–1.2)
Total Protein: 7.8 g/dL (ref 6.5–8.1)

## 2017-03-17 MED ORDER — SODIUM CHLORIDE 0.9 % IV BOLUS (SEPSIS)
1000.0000 mL | Freq: Once | INTRAVENOUS | Status: AC
  Administered 2017-03-17: 1000 mL via INTRAVENOUS

## 2017-03-17 MED ORDER — CEPHALEXIN 500 MG PO CAPS: 500.0000 mg | ORAL_CAPSULE | Freq: Three times a day (TID) | ORAL | 0 refills | Status: AC

## 2017-03-17 MED ORDER — CEFTRIAXONE SODIUM 1 G IJ SOLR
1.0000 g | Freq: Once | INTRAMUSCULAR | Status: AC
  Administered 2017-03-17: 1 g via INTRAVENOUS
  Filled 2017-03-17: qty 10

## 2017-03-17 NOTE — ED Triage Notes (Signed)
Reports fever of 103 at home last night.  Pain to rt leg, wound on rt buttocks, states he saw pus on abd pain this morning.

## 2017-03-17 NOTE — Discharge Instructions (Signed)

## 2017-03-17 NOTE — ED Provider Notes (Signed)
Emergency Department Provider Note   I have reviewed the triage vital signs and the nursing notes.   HISTORY  Chief Complaint Wound Infection   HPI [name] is a 57 y.o. male with PMH of paraplegia and right ischial pressure ulcer (stage 4) s/p wound debridement at [facility] with Dr. Mardene Speak on 03/15/17 since to the emergency department with fever and fatigue last night with acute on chronic right leg pain.  Patient has not had fever today.  States that after the procedure he did well and felt like his chronic pain was well controlled.  He has been changing his dressings as instructed.  Last night he recorded a temperature of 103 F and felt very fatigued.  He denies cough, dysuria, rigors, or other infection symptoms.  He has intermittent shooting pain from the right issue him to the thigh.  States that he has this pain frequently but it has been worse over the past 2 days.  He noticed some drainage on the dressing as he changes them and was concerned about possible wound infection. Patient is not currently on antibiotics. No modifying factors.    Past Medical History:  Diagnosis Date  . Anemia    Patient states that he was recently told this  . Anxiety   . Arthritis   . Blood transfusion without reported diagnosis   . Depression   . GERD (gastroesophageal reflux disease)   . Hepatitis C   . MRSA infection   . Neuromuscular disorder (HCC)   . Osteomyelitis (HCC)   . Paralysis (HCC)   . Paraparesis (HCC)    parapalegic   . 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  . Substance abuse (HCC)    drinking,     Patient Active Problem List   Diagnosis Date Noted  . Pain syndrome, chronic 09/14/2016  . Choledocholithiasis with acute cholecystitis 08/11/2016  . Rectal bleeding 06/01/2016  . Abdominal pain 01/28/2015  . Hepatitis C 05/17/2013  . Closed supracondylar fracture of right femur (HCC) 09/25/2012    Class: Acute  . Right radial  head fracture 07/05/2012  . Chronic hepatitis C (HCC) 05/10/2011  . Decubitus ulcer 05/10/2011  . Femur fracture, left (HCC) 06/30/2010  . Paraplegia (HCC) 06/30/2010  . CALCU BD WITHOUT MENTION CHOLECYST/OBSTRUCTION 05/22/2008  . CLOSED FRACTURE OF SHAFT OF TIBIA 03/22/2007    Past Surgical History:  Procedure Laterality Date  . BACK SURGERY    . BALLOON DILATION N/A 08/11/2016   Procedure: BALLOON DILATION;  Surgeon: Malissa Hippo, MD;  Location: [facility];  Service: Endoscopy;  Laterality: N/A;  . CHOLECYSTECTOMY    . ERCP N/A 08/11/2016   Procedure: ENDOSCOPIC RETROGRADE CHOLANGIOPANCREATOGRAPHY (ERCP) extended Sphincterotomy;  Surgeon: Malissa Hippo, MD;  Location: [facility];  Service: Endoscopy;  Laterality: N/A;  . FEMUR FRACTURE SURGERY  March 2012   retrograde intramedullary femoral nail  . FRACTURE SURGERY     broke back, fusion  . GALLBLADDER SURGERY  2010  . HERNIA REPAIR     very young  . ORIF FEMUR FRACTURE Right 08/04/2012   Procedure: OPEN REDUCTION INTERNAL FIXATION (ORIF) DISTAL FEMUR FRACTURE;  Surgeon: Eldred Manges, MD;  Location: [facility] OR;  Service: Orthopedics;  Laterality: Right;  Right Retrograde Supracondylar Nail Right Femur    Current Outpatient Rx  . Order #: [id number] Class: Historical Med  . Order #: [id number] Class: Print  . Order #: [id number] Class: Print  . Order #:  [id number] Class: Historical Med  . Order #: [id number] Class: Historical Med  . Order #: [id number] Class: Print  . Order #: [id number] Class: Historical Med    Allergies Patient has no known allergies.  Family History  Problem Relation Age of Onset  . Ovarian cancer Mother   . HIV Sister   . Healthy Sister   . Hodgkin's lymphoma Sister   . Heart disease Sister   . Healthy Sister   . Healthy Son   . Healthy Daughter   . Anemia Daughter   . Thyroid disease Daughter   . Cancer Unknown        family history     Social History Social History   Tobacco Use  . Smoking status: Former  Smoker    Packs/day: 1.00    Years: 5.00    Pack years: 5.00    Types: Cigarettes    Last attempt to quit: 08/10/2010    Years since quitting: 6.6  . Smokeless tobacco: Never Used  Substance Use Topics  . Alcohol use: No    Comment: Patient quit drinking 1 year ago  . Drug use: No    Review of Systems  Constitutional: Positive fever. No chills. Positive fatigue.  Eyes: No visual changes. ENT: No sore throat. Cardiovascular: Denies chest pain. Respiratory: Denies shortness of breath. Gastrointestinal: No abdominal pain.  No nausea, no vomiting.  No diarrhea.  No constipation. Genitourinary: Negative for dysuria. Musculoskeletal: Negative for back pain. Positive right buttock pain.  Skin: Negative for rash. Neurological: Negative for headaches, focal weakness or numbness.  10-point ROS otherwise negative.  ____________________________________________   PHYSICAL EXAM:  VITAL SIGNS: ED Triage Vitals  Enc Vitals Group     BP 03/17/17 1613 (!) 144/88     Pulse Rate 03/17/17 1613 (!) 127     Resp 03/17/17 1613 19     Temp 03/17/17 1613 98.2 F (36.8 C)     Temp Source 03/17/17 1613 Oral     SpO2 03/17/17 1613 100 %     Weight 03/17/17 1614 136 lb 4.8 oz (61.8 kg)     Height 03/17/17 1614 5\' 9"  (1.753 m)   Constitutional: Alert and oriented. Well appearing and in no acute distress. Eyes: Conjunctivae are normal. Head: Atraumatic. Nose: No congestion/rhinnorhea. Mouth/Throat: Mucous membranes are moist.  Neck: No stridor.  Cardiovascular: Tachycardia. Good peripheral circulation. Grossly normal heart sounds.   Respiratory: Normal respiratory effort.  No retractions. Lungs CTAB. Gastrointestinal: Soft and nontender. No distention.  Musculoskeletal: No lower extremity tenderness nor edema. Baseline paraplegia.  Neurologic:  Normal speech and language. Flaccid paralysis of the B/L LEs.  Skin:  Skin is warm and dry. Large (stage4) pressure ulcer over the right ischium.  Wound base is muscle with surrounding granulation tissue. No foul odor. Mild discharge. No surrounding cellulitis or abscess.    ____________________________________________   LABS (all labs ordered are listed, but only abnormal results are displayed)  Labs Reviewed  COMPREHENSIVE METABOLIC PANEL - Abnormal; Notable for the following components:      Result Value   Sodium 132 (*)    Potassium 3.4 (*)    Chloride 95 (*)    Calcium 8.4 (*)    Albumin 3.3 (*)    ALT 9 (*)    All other components within normal limits  CBC WITH DIFFERENTIAL/PLATELET - Abnormal; Notable for the following components:   WBC 14.9 (*)    Hemoglobin 11.3 (*)    HCT 36.2 (*)    MCH 24.9 (*)  Neutro Abs 12.6 (*)    Monocytes Absolute 1.5 (*)    All other components within normal limits  URINALYSIS, ROUTINE W REFLEX MICROSCOPIC - Abnormal; Notable for the following components:   Color, Urine AMBER (*)    APPearance HAZY (*)    Bilirubin Urine SMALL (*)    Ketones, ur 5 (*)    Leukocytes, UA MODERATE (*)    Bacteria, UA RARE (*)    Squamous Epithelial / LPF 0-5 (*)    All other components within normal limits  CULTURE, BLOOD (ROUTINE X 2)  CULTURE, BLOOD (ROUTINE X 2)  URINE CULTURE   ____________________________________________  RADIOLOGY  Dg Chest 2 View  Result Date: 03/17/2017 CLINICAL DATA:  Fever to 103 last evening. Right leg pain and wound in the right buttock. EXAM: CHEST  2 VIEW COMPARISON:  03/18/2015 FINDINGS: The heart size and mediastinal contours are within normal limits. Aortic atherosclerosis at the arch is noted without aneurysmal dilatation. Both lungs are clear. The visualized spinal hardware along the thoracic spine from T4 caudad is stable in appearance. IMPRESSION: No active cardiopulmonary disease. Electronically Signed   By: Tollie Eth M.D.   On: 03/17/2017 17:37   Dg Hips Bilat W Or Wo Pelvis 2 Views  Result Date: 03/17/2017 CLINICAL DATA:  Right buttock wound. Patient  is paralyzed from waist down. EXAM: DG HIP (WITH OR WITHOUT PELVIS) 2V BILAT COMPARISON:  CT 08/10/2016 FINDINGS: Bone resorption of the right inferior pubic ramus and portions of the right ischium are identified with adjacent large soft tissue ulceration. Otherwise, the bony pelvis is unremarkable. Findings likely represent stigmata of chronic osteomyelitis and pressure erosive erosions. Joint space narrowing of both hips right worse than left. The femoral heads are spherical in appearance. Partially imaged bilateral femoral nail fixations the included lower lumbar spine is unremarkable with osteophytic change about the right SI joint. IMPRESSION: 1. Gracile appearance of the right inferior pubic ramus and ischium with with overlying soft tissue ulceration involving the right buttock. Findings likely represent stigmata of chronic osteomyelitis and/or pressure erosions. 2. 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 3. No acute osseous appearing abnormality. Electronically Signed   By: Tollie Eth M.D.   On: 03/17/2017 17:42    ____________________________________________   PROCEDURES  Procedure(s) performed:   Procedures  None ____________________________________________   INITIAL IMPRESSION / ASSESSMENT AND PLAN / ED COURSE  Pertinent labs & imaging results that were available during my care of the patient were reviewed by me and considered in my medical decision making (see chart for details).  Patient presents for evaluation of fever in the setting of large pressure ulcer over the right ischium that is post recent debridement with Dr. Mardene Speak at [facility].  Patient is afebrile here but has some tachycardia.  Vital signs are otherwise unremarkable.  My evaluation of the wound shows a very large pressure ulcer with muscular base and very mild discharge.  No foul odor.  No surrounding tenderness, cellulitis, evidence of abscess.  Patient has had osteomyelitis in the past and  has recent plain film of the pelvis and bilateral hips showing no acute osteomyelitis changes.  Plan for labs, cultures, chest x-ray, and plain film of the hips and pelvis for comparison. Considering other causes of post-op fever given overall well appearance of the wound.   07:20 PM UA with some evidence of UTI. Given fever at home and fatigue plan for Rocephin in the ED and discharge home with abx. Will  follow with wound care as scheduled. Plain films of the chest and hips with no acute findings. Hip/pelvis film with similar read when compared to [facility] plain [id number].   At this time, I do not feel there is any life-threatening condition present. I have reviewed and discussed all results (EKG, imaging, lab, urine as appropriate), exam findings with patient. I have reviewed nursing notes and appropriate previous records.  I feel the patient is safe to be discharged home without further emergent workup. Discussed usual and customary return precautions. Patient and family (if present) verbalize understanding and are comfortable with this plan.  Patient will follow-up with their primary care provider. If they do not have a primary care provider, information for follow-up has been provided to them. All questions have been answered.  ____________________________________________  FINAL CLINICAL IMPRESSION(S) / ED DIAGNOSES  Final diagnoses:  Fever, unspecified fever cause  Acute cystitis without hematuria  Buttock wound, right, sequela     MEDICATIONS GIVEN DURING THIS VISIT:  Medications  sodium chloride 0.9 % bolus 1,000 mL (0 mLs Intravenous Stopped 03/17/17 2022)  cefTRIAXone (ROCEPHIN) 1 g in dextrose 5 % 50 mL IVPB (0 g Intravenous Stopped 03/17/17 1958)     NEW OUTPATIENT MEDICATIONS STARTED DURING THIS VISIT:  Discharge Medication List as of 03/17/2017  8:06 PM    START taking these medications   Details  cephALEXin (KEFLEX) 500 MG capsule Take 1 capsule (500 mg total) by mouth  3 (three) times daily for 7 days., Starting Thu 03/17/2017, Until Thu 03/24/2017, Print        Note:  This document was prepared using Dragon voice recognition software and may include unintentional dictation errors.  Alona BeneJoshua Long, MD Emergency Medicine    Long, Arlyss RepressJoshua G, MD 03/17/17 2103

## 2017-03-20 LAB — URINE CULTURE: Special Requests: NORMAL

## 2017-03-21 ENCOUNTER — Telehealth: Payer: Self-pay | Admitting: Emergency Medicine

## 2017-03-21 NOTE — Telephone Encounter (Signed)
Post ED Visit - Positive Culture Follow-up: Successful Patient Follow-Up  Culture assessed and recommendations reviewed by: [x]  Enzo BiNathan Batchelder, Pharm.D. []  Celedonio MiyamotoJeremy Frens, Pharm.D., BCPS AQ-ID []  Garvin FilaMike Maccia, Pharm.D., BCPS []  Georgina PillionElizabeth Martin, Pharm.D., BCPS []  WasolaMinh Pham, VermontPharm.D., BCPS, AAHIVP []  Estella HuskMichelle Turner, Pharm.D., BCPS, AAHIVP []  Lysle Pearlachel Rumbarger, PharmD, BCPS []  Casilda Carlsaylor Stone, PharmD, BCPS []  Pollyann SamplesAndy Johnston, PharmD, BCPS  Positive urine culture  []  Patient discharged without antimicrobial prescription and treatment is now indicated [x]  Organism is resistant to prescribed ED discharge antimicrobial []  Patient with positive blood cultures  Changes discussed with ED provider: Buel ReamAlexandra Law PA New antibiotic prescription stop keflex,start Bactrim DS 1 tab po bid x 7 days  Attempting to contact patient   Berle MullMiller, Marilyn Wing 03/21/2017, 9:38 AM

## 2017-03-21 NOTE — Progress Notes (Signed)
ED Antimicrobial Stewardship Positive Culture Follow Up   [name] is an 57 y.o. male [facility] with a chief complaint of  Chief Complaint  Patient presents with  . Wound Infection    Recent Results (from the past 720 hour(s))  Urine culture     Status: Abnormal   Collection Time: 03/17/17  4:27 PM  Result Value Ref Range Status   Specimen Description URINE, CLEAN CATCH  Final   Special Requests Normal  Final   Culture 30,000 COLONIES/mL ENTEROBACTER AEROGENES (A)  Final   Report Status 03/20/2017 FINAL  Final   Organism ID, Bacteria ENTEROBACTER AEROGENES (A)  Final      Susceptibility   Enterobacter aerogenes - MIC*    CEFAZOLIN >=64 RESISTANT Resistant     CEFTRIAXONE <=1 SENSITIVE Sensitive     CIPROFLOXACIN <=0.25 SENSITIVE Sensitive     GENTAMICIN <=1 SENSITIVE Sensitive     IMIPENEM 1 SENSITIVE Sensitive     NITROFURANTOIN 128 RESISTANT Resistant     TRIMETH/SULFA <=20 SENSITIVE Sensitive     PIP/TAZO <=4 SENSITIVE Sensitive     * 30,000 COLONIES/mL ENTEROBACTER AEROGENES  Culture, blood (routine x 2)     Status: None (Preliminary result)   Collection Time: 03/17/17  4:34 PM  Result Value Ref Range Status   Specimen Description RIGHT ANTECUBITAL  Final   Special Requests   Final    Blood Culture adequate volume BOTTLES DRAWN AEROBIC AND ANAEROBIC   Culture NO GROWTH 4 DAYS  Final   Report Status PENDING  Incomplete  Culture, blood (routine x 2)     Status: None (Preliminary result)   Collection Time: 03/17/17  4:43 PM  Result Value Ref Range Status   Specimen Description BLOOD RIGHT FOREARM  Final   Special Requests   Final    BOTTLES DRAWN AEROBIC AND ANAEROBIC Blood Culture adequate volume   Culture NO GROWTH 4 DAYS  Final   Report Status PENDING  Incomplete    [x]  Treated with Keflex, organism resistant to prescribed antimicrobial  New antibiotic prescription: Bactrim 1 DS tablet PO BID x 7 days  ED Provider: Buel ReamAlexandra  Law PA-C   Armandina StammerBATCHELDER,Deniese Oberry J 03/21/2017, 9:14 AM Infectious Diseases Pharmacist Phone# [phone]

## 2017-03-22 LAB — CULTURE, BLOOD (ROUTINE X 2)
Culture: NO GROWTH
Culture: NO GROWTH
SPECIAL REQUESTS: ADEQUATE
Special Requests: ADEQUATE

## 2017-07-05 ENCOUNTER — Ambulatory Visit: Payer: Self-pay | Admitting: Physician Assistant

## 2017-08-25 NOTE — Telephone Encounter (Signed)
Error in charting.

## 2018-01-13 ENCOUNTER — Other Ambulatory Visit: Payer: Self-pay

## 2018-01-13 ENCOUNTER — Encounter (HOSPITAL_COMMUNITY): Payer: Self-pay | Admitting: Emergency Medicine

## 2018-01-13 ENCOUNTER — Emergency Department (HOSPITAL_COMMUNITY)
Admission: EM | Admit: 2018-01-13 | Discharge: 2018-01-13 | Disposition: A | Discharge status: Home / Self Care | Payer: Medicare Other | Attending: Emergency Medicine | Admitting: Emergency Medicine

## 2018-01-13 ENCOUNTER — Emergency Department (HOSPITAL_COMMUNITY): Payer: Medicare Other

## 2018-01-13 DIAGNOSIS — R509 Fever, unspecified: Secondary | ICD-10-CM | POA: Diagnosis not present

## 2018-01-13 DIAGNOSIS — Z87891 Personal history of nicotine dependence: Secondary | ICD-10-CM | POA: Insufficient documentation

## 2018-01-13 DIAGNOSIS — R079 Chest pain, unspecified: Secondary | ICD-10-CM | POA: Diagnosis not present

## 2018-01-13 DIAGNOSIS — R05 Cough: Secondary | ICD-10-CM | POA: Insufficient documentation

## 2018-01-13 DIAGNOSIS — M791 Myalgia, unspecified site: Secondary | ICD-10-CM | POA: Diagnosis not present

## 2018-01-13 DIAGNOSIS — B349 Viral infection, unspecified: Secondary | ICD-10-CM

## 2018-01-13 DIAGNOSIS — Z79899 Other long term (current) drug therapy: Secondary | ICD-10-CM | POA: Diagnosis not present

## 2018-01-13 DIAGNOSIS — R51 Headache: Secondary | ICD-10-CM | POA: Insufficient documentation

## 2018-01-13 LAB — CBC WITH DIFFERENTIAL/PLATELET
Abs Immature Granulocytes: 0.01 10*3/uL (ref 0.00–0.07)
BASOS ABS: 0 10*3/uL (ref 0.0–0.1)
Basophils Relative: 0 %
Eosinophils Absolute: 0.1 10*3/uL (ref 0.0–0.5)
Eosinophils Relative: 2 %
HEMATOCRIT: 38.6 % — AB (ref 39.0–52.0)
HEMOGLOBIN: 12 g/dL — AB (ref 13.0–17.0)
IMMATURE GRANULOCYTES: 0 %
LYMPHS ABS: 0.9 10*3/uL (ref 0.7–4.0)
LYMPHS PCT: 25 %
MCH: 24.8 pg — ABNORMAL LOW (ref 26.0–34.0)
MCHC: 31.1 g/dL (ref 30.0–36.0)
MCV: 79.8 fL — ABNORMAL LOW (ref 80.0–100.0)
Monocytes Absolute: 0.3 10*3/uL (ref 0.1–1.0)
Monocytes Relative: 8 %
NEUTROS PCT: 65 %
NRBC: 0 % (ref 0.0–0.2)
Neutro Abs: 2.4 10*3/uL (ref 1.7–7.7)
Platelets: 244 10*3/uL (ref 150–400)
RBC: 4.84 MIL/uL (ref 4.22–5.81)
RDW: 14.7 % (ref 11.5–15.5)
WBC: 3.6 10*3/uL — ABNORMAL LOW (ref 4.0–10.5)

## 2018-01-13 LAB — BASIC METABOLIC PANEL
ANION GAP: 8 (ref 5–15)
BUN: 10 mg/dL (ref 6–20)
CHLORIDE: 102 mmol/L (ref 98–111)
CO2: 23 mmol/L (ref 22–32)
Calcium: 8.6 mg/dL — ABNORMAL LOW (ref 8.9–10.3)
Creatinine, Ser: 0.65 mg/dL (ref 0.61–1.24)
GFR calc non Af Amer: >60 mL/min (ref >60)
Glucose, Bld: 139 mg/dL — ABNORMAL HIGH (ref 70–99)
Potassium: 3.5 mmol/L (ref 3.5–5.1)
SODIUM: 133 mmol/L — AB (ref 135–145)

## 2018-01-13 LAB — INFLUENZA PANEL BY PCR (TYPE A & B)
INFLBPCR: NEGATIVE
Influenza A By PCR: NEGATIVE

## 2018-01-13 LAB — TROPONIN I

## 2018-01-13 MED ORDER — SODIUM CHLORIDE 0.9 % IV BOLUS
1000.0000 mL | Freq: Once | INTRAVENOUS | Status: AC
  Administered 2018-01-13: 1000 mL via INTRAVENOUS

## 2018-01-13 NOTE — Discharge Instructions (Addendum)
You were seen in the emergency department for chest pain and illness symptoms.  You had blood work chest x-ray EKG that did not show an obvious cause of your symptoms.  It will be important for you to stay well-hydrated and you can take Tylenol as needed for aches and pains.  Follow-up with your doctor and return if any worsening symptoms.

## 2018-01-13 NOTE — ED Provider Notes (Signed)
[facility] EMERGENCY DEPARTMENT Provider Note   CSN: [id number] Arrival date & time: 01/13/18  1055     History   Chief Complaint Chief Complaint  Patient presents with  . Chest Pain    HPI [name] is a 57 y.o. male.  He has a history of paraplegia.  He presented today with a complaint of not feeling well.  He is complaining of 4 days of body aches feeling feverish headache cough and some chest pain.  No sick contacts.  He says they started after he took his dog to the vet.  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  Is been checking his temperature has not had elevated temperature.  The history is provided by the patient.  Chest Pain   This is a new problem. The current episode started more than 2 days ago. The problem occurs daily. The problem has not changed since onset.Associated with: nothing. The pain is present in the substernal region. The pain is mild. The quality of the pain is described as pressure-like. The pain does not radiate. Associated symptoms include cough, headaches and malaise/fatigue. Pertinent negatives include no abdominal pain, no fever, no hemoptysis, no leg pain, no nausea, no shortness of breath, no sputum production and no vomiting. He has tried nothing for the symptoms. The treatment provided no relief.    Past Medical History:  Diagnosis Date  . Anemia    Patient states that he was recently told this  . Anxiety   . Arthritis   . Blood transfusion without reported diagnosis   . Depression   . GERD (gastroesophageal reflux disease)   . Hepatitis C   . MRSA infection   . Neuromuscular disorder (HCC)   . Osteomyelitis (HCC)   . Paralysis (HCC)   . Paraparesis (HCC)    parapalegic   . 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  . Substance abuse (HCC)    drinking,     Patient Active Problem List    Diagnosis Date Noted  . Pain syndrome, chronic 09/14/2016  . Choledocholithiasis with acute cholecystitis 08/11/2016  . Rectal bleeding 06/01/2016  . Abdominal pain 01/28/2015  . Hepatitis C 05/17/2013  . Closed supracondylar fracture of right femur (HCC) 09/25/2012    Class: Acute  . Right radial head fracture 07/05/2012  . Chronic hepatitis C (HCC) 05/10/2011  . Decubitus ulcer 05/10/2011  . Femur fracture, left (HCC) 06/30/2010  . Paraplegia (HCC) 06/30/2010  . CALCU BD WITHOUT MENTION CHOLECYST/OBSTRUCTION 05/22/2008  . CLOSED FRACTURE OF SHAFT OF TIBIA 03/22/2007    Past Surgical History:  Procedure Laterality Date  . BACK SURGERY    . BALLOON DILATION N/A 08/11/2016   Procedure: BALLOON DILATION;  Surgeon: Malissa Hippo, MD;  Location: [facility];  Service: Endoscopy;  Laterality: N/A;  . CHOLECYSTECTOMY    . ERCP N/A 08/11/2016   Procedure: ENDOSCOPIC RETROGRADE CHOLANGIOPANCREATOGRAPHY (ERCP) extended Sphincterotomy;  Surgeon: Malissa Hippo, MD;  Location: [facility];  Service: Endoscopy;  Laterality: N/A;  . FEMUR FRACTURE SURGERY  March 2012   retrograde intramedullary femoral nail  . FRACTURE SURGERY     broke back, fusion  . GALLBLADDER SURGERY  2010  . HERNIA REPAIR     very young  . ORIF FEMUR FRACTURE Right 08/04/2012   Procedure: OPEN REDUCTION INTERNAL FIXATION (  ORIF) DISTAL FEMUR FRACTURE;  Surgeon: Eldred Manges, MD;  Location: [facility] OR;  Service: Orthopedics;  Laterality: Right;  Right Retrograde Supracondylar Nail Right Femur        Home Medications    Prior to Admission medications   Medication Sig Start Date End Date Taking? Authorizing Provider  Calcium Carbonate Antacid (ANTACID E-X PO) Take 4 tablets by mouth once as needed (for indigestion/abdominal pain).    [provider]  ibuprofen (ADVIL,MOTRIN) 600 MG tablet Take 1 tablet (600 mg total) by mouth every 6 (six) hours as needed. 10/30/16   Burgess Amor, PA-C  methadone (DOLOPHINE) 10 MG tablet  Take 20 mg by mouth 3 (three) times daily.    [provider]  Methylnaltrexone Bromide (RELISTOR) 150 MG TABS Take 150 mg by mouth daily.    [provider]  omeprazole (PRILOSEC) 20 MG capsule Take 1 capsule (20 mg total) by mouth daily. Patient taking differently: Take 20 mg by mouth as needed (indigestion).  03/24/16   Horton, Mayer Masker, MD  pregabalin (LYRICA) 75 MG capsule Take 75 mg by mouth 3 (three) times daily.     [provider]    Family History Family History  Problem Relation Age of Onset  . Ovarian cancer Mother   . HIV Sister   . Healthy Sister   . Hodgkin's lymphoma Sister   . Heart disease Sister   . Healthy Sister   . Healthy Son   . Healthy Daughter   . Anemia Daughter   . Thyroid disease Daughter   . Cancer Unknown        family history     Social History Social History   Tobacco Use  . Smoking status: Former Smoker    Packs/day: 1.00    Years: 5.00    Pack years: 5.00    Types: Cigarettes    Last attempt to quit: 08/10/2010    Years since quitting: 7.4  . Smokeless tobacco: Never Used  Substance Use Topics  . Alcohol use: No    Comment: Patient quit drinking 1 year ago  . Drug use: No     Allergies   Patient has no known allergies.   Review of Systems Review of Systems  Constitutional: Positive for fatigue and malaise/fatigue. Negative for fever.  HENT: Negative for sore throat.   Eyes: Negative for visual disturbance.  Respiratory: Positive for cough. Negative for hemoptysis, sputum production and shortness of breath.   Cardiovascular: Positive for chest pain.  Gastrointestinal: Negative for abdominal pain, nausea and vomiting.  Genitourinary: Negative for dysuria.  Musculoskeletal: Positive for myalgias.  Skin: Positive for wound. Negative for rash.  Neurological: Positive for headaches.     Physical Exam Updated Vital Signs BP (!) 131/94 (BP Location: Right Arm)   Pulse 86   Temp 98 F (36.7 C)  (Oral)   Resp 16   SpO2 96%   Physical Exam  Constitutional: He appears well-developed and well-nourished.  HENT:  Head: Normocephalic and atraumatic.  Eyes: Conjunctivae are normal.  Neck: Neck supple.  Cardiovascular: Normal rate, regular rhythm and normal pulses.  No murmur heard. Pulmonary/Chest: Effort normal and breath sounds normal. No tachypnea.  Abdominal: Soft. He exhibits no mass. There is no tenderness.  Musculoskeletal:  There is some chronic wasting of his lower extremities.  On his right buttock there is a chronic deep wound but with no drainage and no surrounding erythema.  Neurological: He is alert. GCS eye subscore is 4. GCS  verbal subscore is 5. GCS motor subscore is 6.  He has paralysis of his lower extremities.  Skin: Skin is warm and dry. No rash noted.  Psychiatric: He has a normal mood and affect.  Nursing note and vitals reviewed.    ED Treatments / Results  Labs (all labs ordered are listed, but only abnormal results are displayed) Labs Reviewed  BASIC METABOLIC PANEL - Abnormal; Notable for the following components:      Result Value   Sodium 133 (*)    Glucose, Bld 139 (*)    Calcium 8.6 (*)    All other components within normal limits  CBC WITH DIFFERENTIAL/PLATELET - Abnormal; Notable for the following components:   WBC 3.6 (*)    Hemoglobin 12.0 (*)    HCT 38.6 (*)    MCV 79.8 (*)    MCH 24.8 (*)    All other components within normal limits  TROPONIN I  INFLUENZA PANEL BY PCR (TYPE A & B)    EKG None  Radiology Dg Chest 2 View  Result Date: 01/13/2018 CLINICAL DATA:  Chest pain and shortness of breath.  Cough. EXAM: CHEST - 2 VIEW COMPARISON:  03/17/2017 and 08/10/2016 FINDINGS: The heart size and mediastinal contours are within normal limits. Both lungs are clear. Fixation rods and multiple wire sutures are present in the thoracolumbar spine, unchanged. IMPRESSION: No active cardiopulmonary disease. Electronically Signed   By: Francene Boyers M.D.   On: 01/13/2018 12:15    Procedures Procedures (including critical care time)  Medications Ordered in ED Medications  sodium chloride 0.9 % bolus 1,000 mL (0 mLs Intravenous Stopped 01/13/18 1304)     Initial Impression / Assessment and Plan / ED Course  I have reviewed the triage vital signs and the nursing notes.  Pertinent labs & imaging results that were available during my care of the patient were reviewed by me and considered in my medical decision making (see chart for details).  Clinical Course as of Jan 13 1722  Fri Jan 13, 2018  1147 EKG is normal sinus rate of 89 normal intervals low voltage no acute ST-T changes.   [MB]  1254 Since work-up is been fairly unremarkable.  His chest x-ray is clear troponin negative flu test negative.  He feels better after a liter of fluid.  I think is reasonable he be discharged and follow-up with his doctor.  Clear return instructions given.   [MB]    Clinical Course User Index [MB] Terrilee Files, MD      Final Clinical Impressions(s) / ED Diagnoses   Final diagnoses:  Nonspecific chest pain  Nonspecific syndrome suggestive of viral illness    ED Discharge Orders    None       Terrilee Files, MD 01/13/18 1723

## 2018-01-13 NOTE — ED Triage Notes (Signed)
Patient complaining of chest pain and shortness of breath x 4 days.

## 2018-02-15 ENCOUNTER — Encounter (HOSPITAL_BASED_OUTPATIENT_CLINIC_OR_DEPARTMENT_OTHER): Payer: Medicare Other | Attending: Physician Assistant

## 2018-04-26 ENCOUNTER — Telehealth: Payer: Self-pay | Admitting: Orthopedic Surgery

## 2018-04-26 NOTE — Telephone Encounter (Signed)
Patient called wanting to set up an appointment with Dr. Romeo Apple. He had surgery by Dr. Romeo Apple several years ago on his left femur and have no problem with it. He fell out of his wheelchair and fractured his right femur and had surgery by another doctor. He states that it was around 2014. He would like to see Dr. Romeo Apple about this leg. I told him that this would be a 2nd opinion and Dr. Romeo Apple would need the notes, op report, etc to look over to see if there is anything he can offer before any appointment could be scheduled.  He stated he would get the notes.

## 2018-04-27 ENCOUNTER — Telehealth (INDEPENDENT_AMBULATORY_CARE_PROVIDER_SITE_OTHER): Payer: Self-pay | Admitting: Orthopaedic Surgery

## 2018-04-27 NOTE — Telephone Encounter (Signed)
Patient called, is requesting copy of his records and xrays on a CD. He is going to come by office and sign release. Please call when CD is ready. [phone]

## 2018-04-28 NOTE — Telephone Encounter (Signed)
I'm sorry I didn't check before. Looks like he did have one xray is [facility] # [id number] on 08/17/2012. Is that one able to be done? Thanks!

## 2018-04-28 NOTE — Telephone Encounter (Signed)
Are these suppose to be older images because I'm unable to find anything recent or anything that has been done at this office since [facility]

## 2018-04-28 NOTE — Telephone Encounter (Signed)
I was able to pull x-ray and make CD. CD given to Tammy to put with records.

## 2018-05-19 IMAGING — DX DG ABDOMEN ACUTE W/ 1V CHEST
3 series · 3 of 3 positions shown · non-contrast
Comparison: 03/24/2016,

CLINICAL DATA: Epigastric pain

EXAM:
DG ABDOMEN ACUTE W/ 1V CHEST

[abdomen erect]
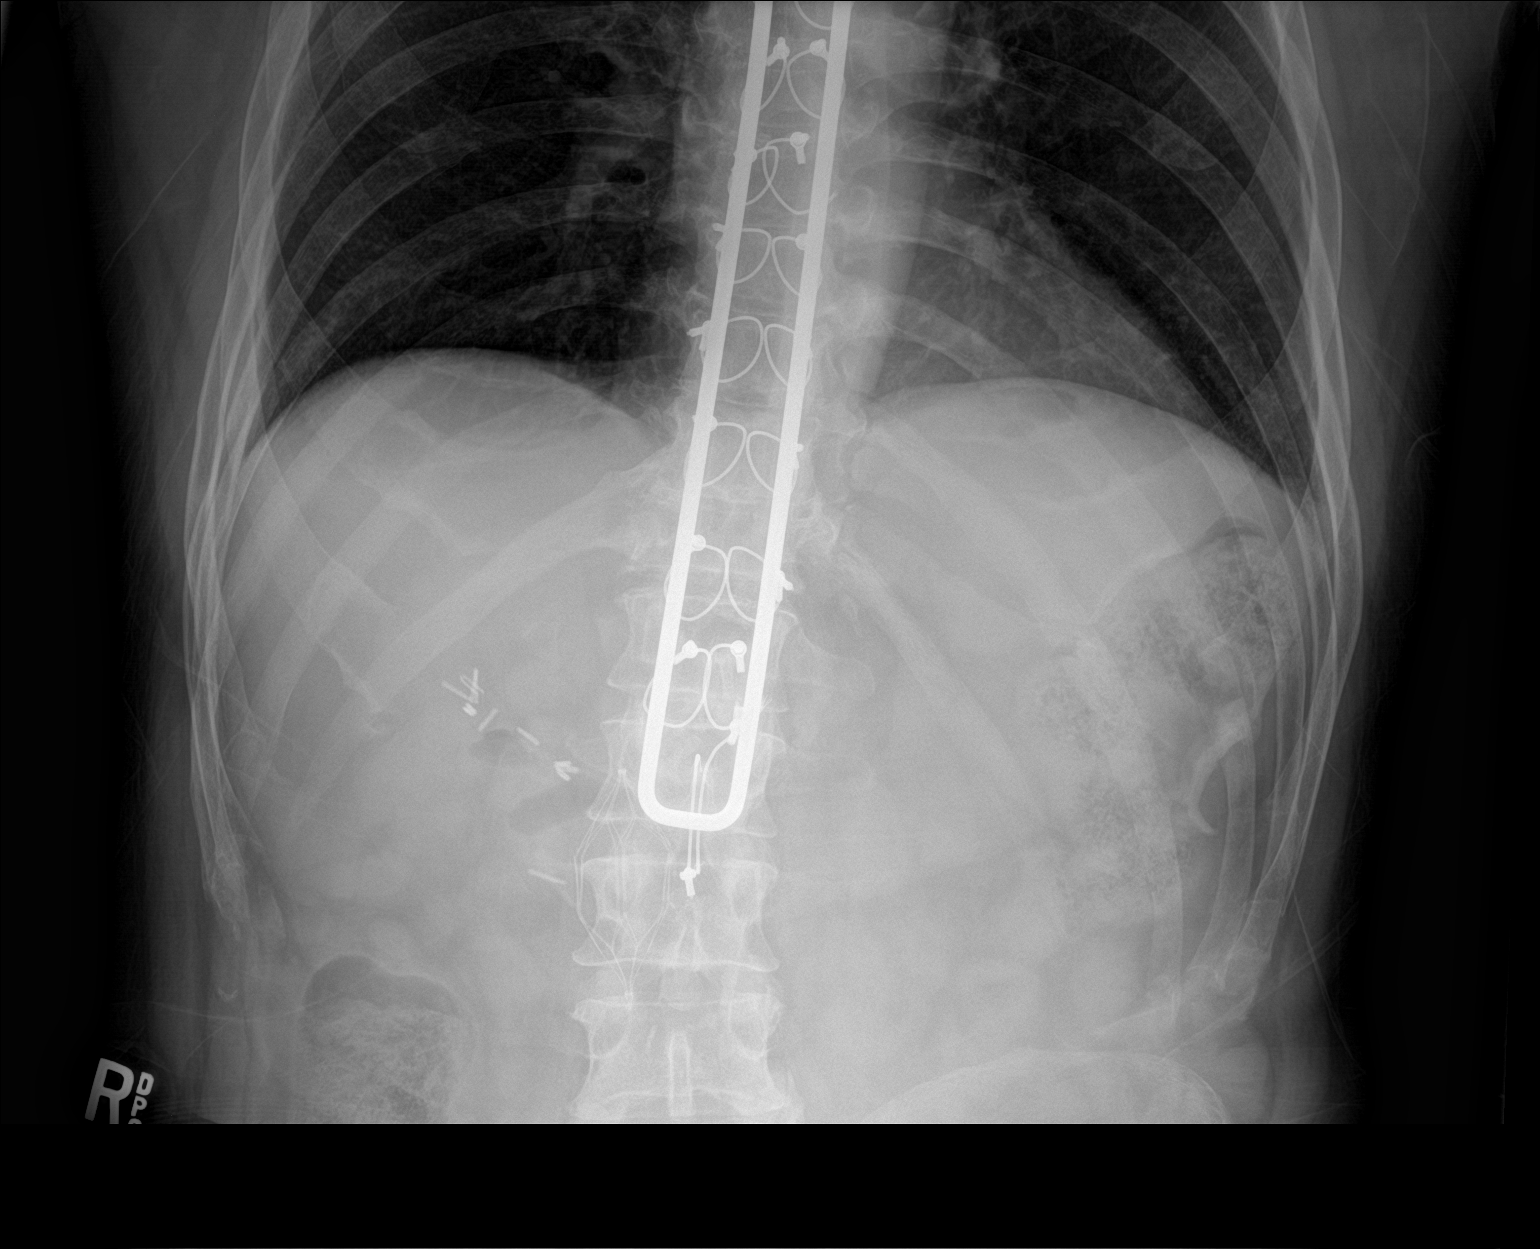

[abdomen supine]
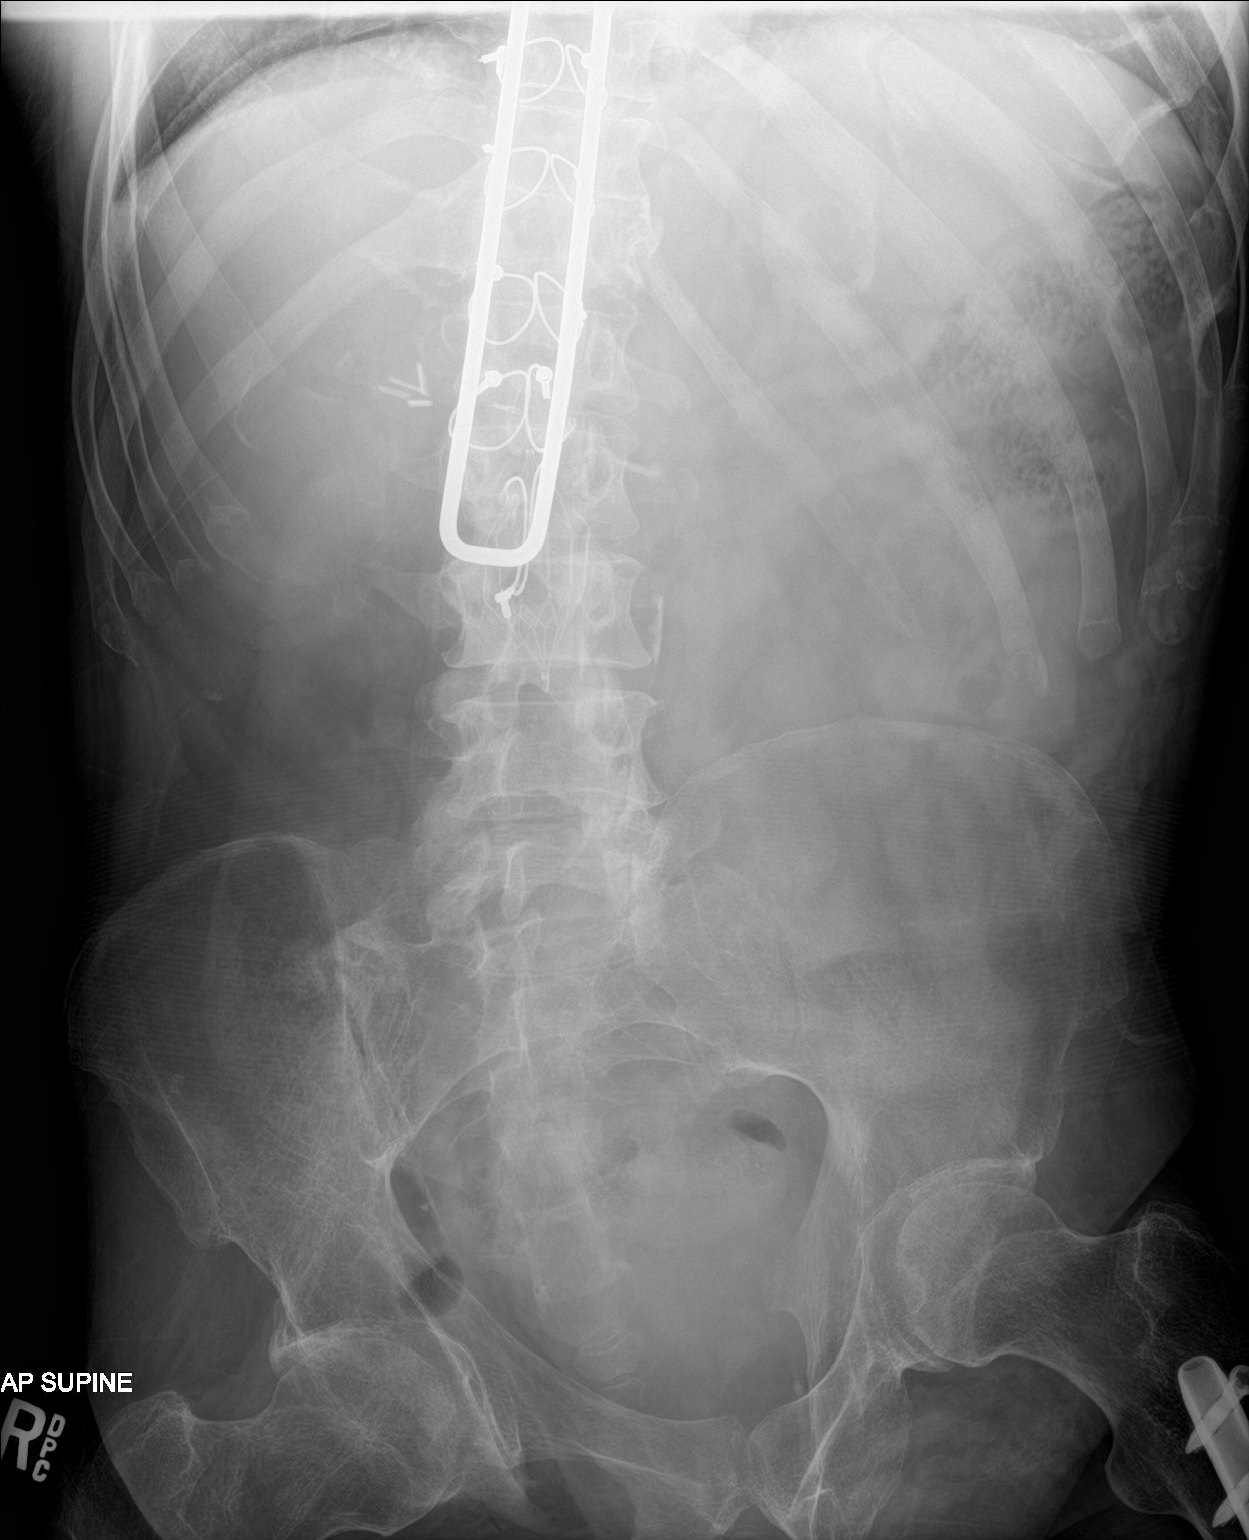

[chest ap]
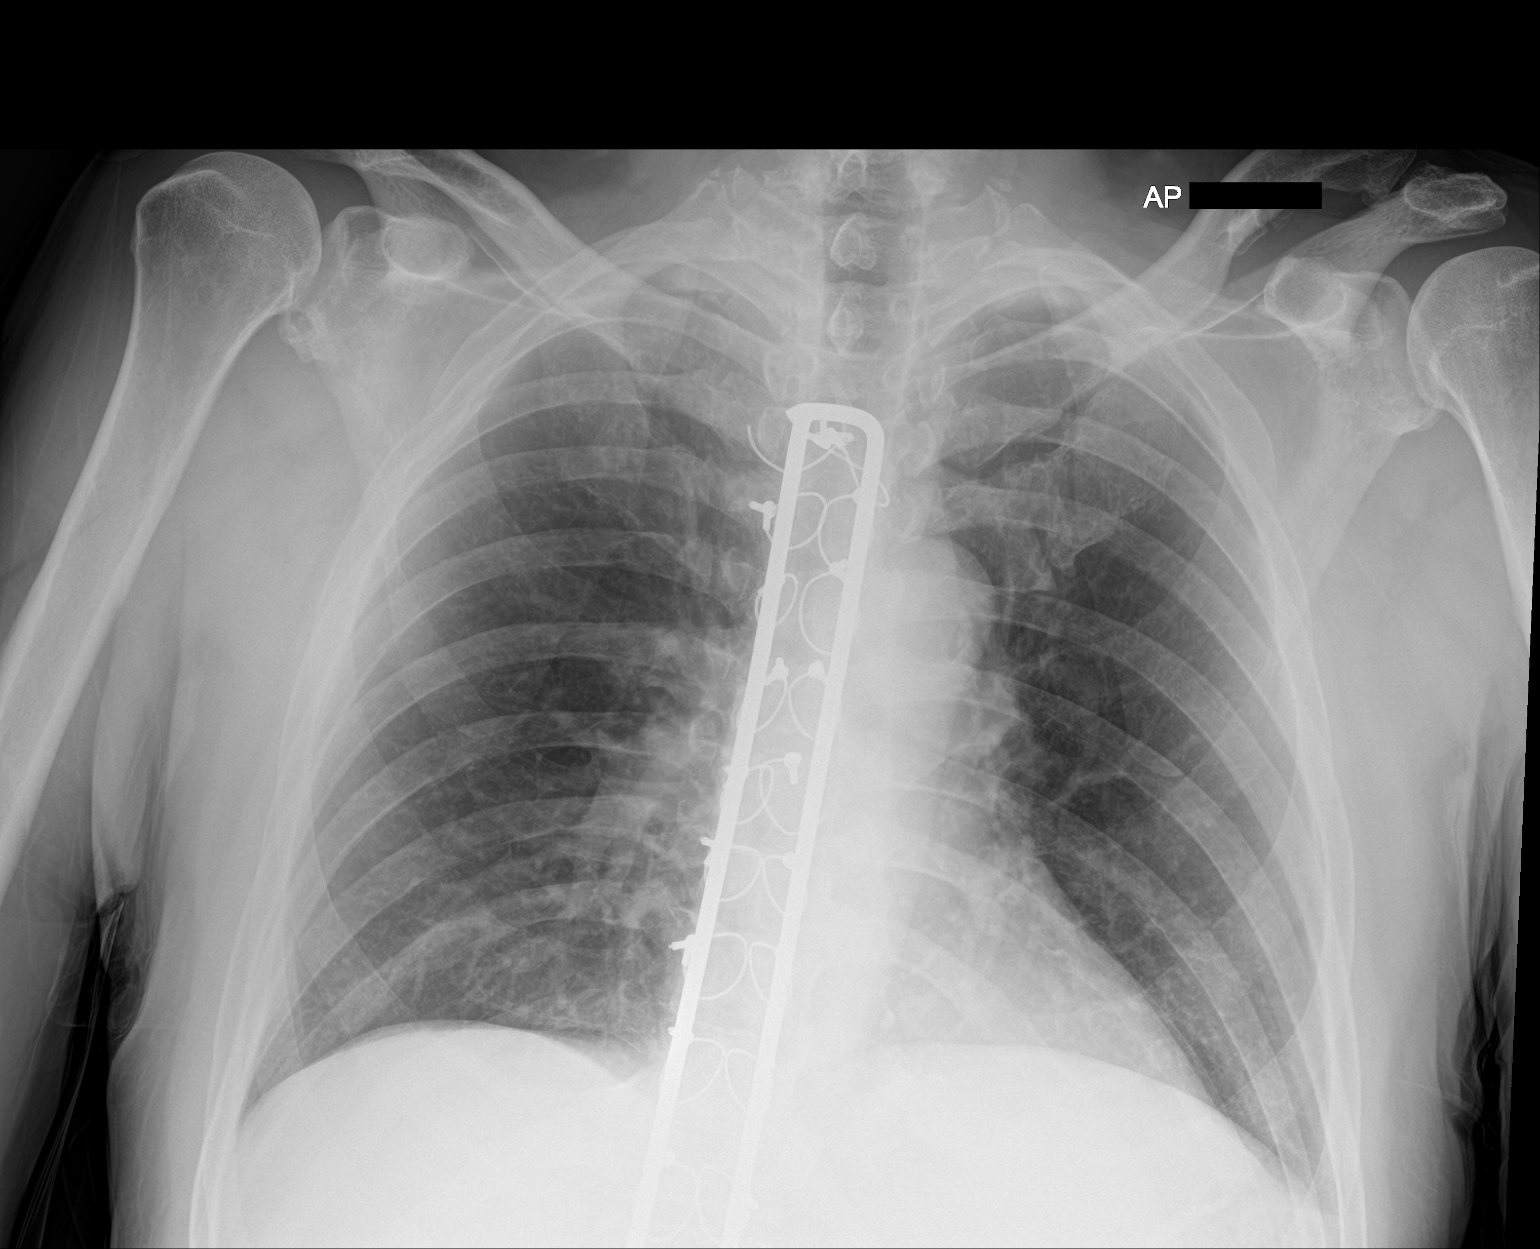

[3 of 3 positions shown; findings below may reference images not displayed]

FINDINGS: Single-view chest demonstrates posterior Jaanike Firsova with disrupted
first cerclage wires. This is unchanged. No acute infiltrate or
effusion. Normal cardiomediastinal silhouette.

Supine and upright views of the abdomen demonstrate no free air
beneath the diaphragm. There are surgical clips in the right upper
quadrant. Non obstructed gas pattern with decreased bowel gas
throughout. Aortic atherosclerosis. Advanced arthritis of the hips.
IVC filter at L2-L3 level.
IMPRESSION: 1. No radiographic evidence for acute cardiopulmonary abnormality.
2. Overall nonobstructed gas pattern with diffusely decreased bowel
gas.

## 2018-05-19 IMAGING — CT CT ABD-PELV W/ CM
2 of 4 series · 16 of 46 positions shown, 18 images · IV contrast (Isovue)
Comparison: 08/10/2016, CT 03/24/2016, 01/28/2015

CLINICAL DATA: No bowel movements in the month, abdominal pain

EXAM:
CT ABDOMEN AND PELVIS WITH CONTRAST
TECHNIQUE: Multidetector CT imaging of the abdomen and pelvis was performed
using the standard protocol following bolus administration of
intravenous contrast.
CONTRAST:  100mL [id number] IOPAMIDOL ([id number]) INJECTION 61%

[Series 2: axial st · axial · 0.77mm/px · z∈[-222,+152]mm · 13 of 83 slices shown, 15 images]
[im 4/83  soft-tissue]
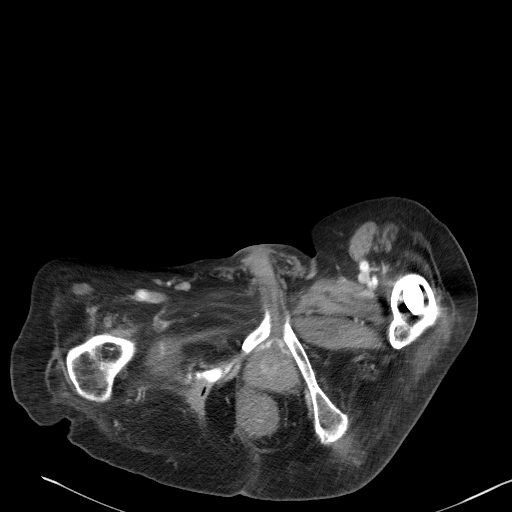
[im 4/83  bone]
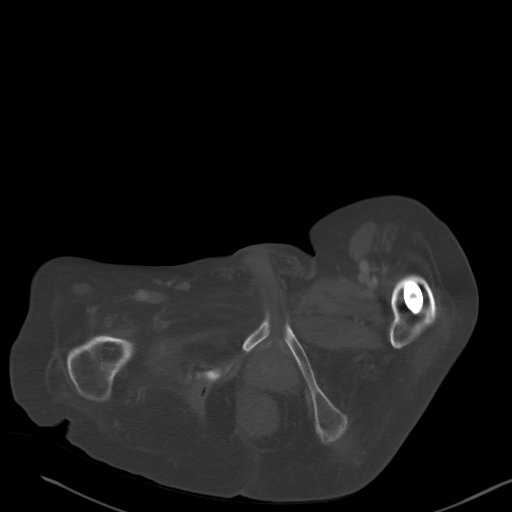
[im 12/83  soft-tissue]
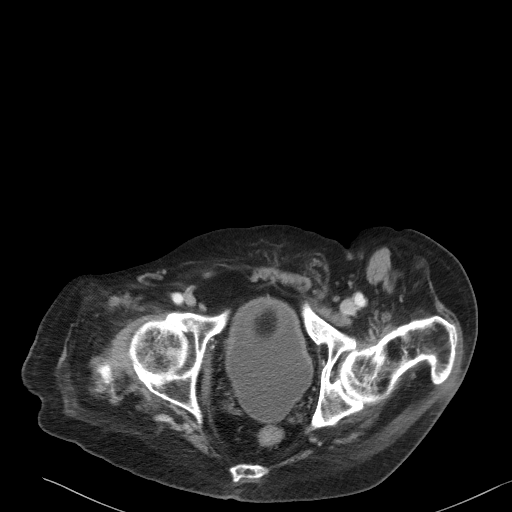
[im 16/83  soft-tissue]
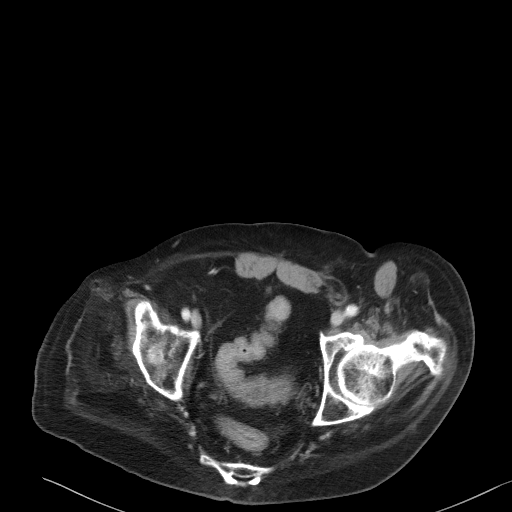
[im 24/83  soft-tissue]
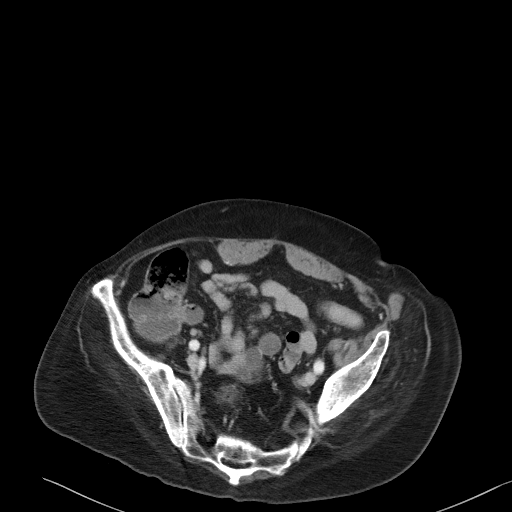
[im 28/83  soft-tissue]
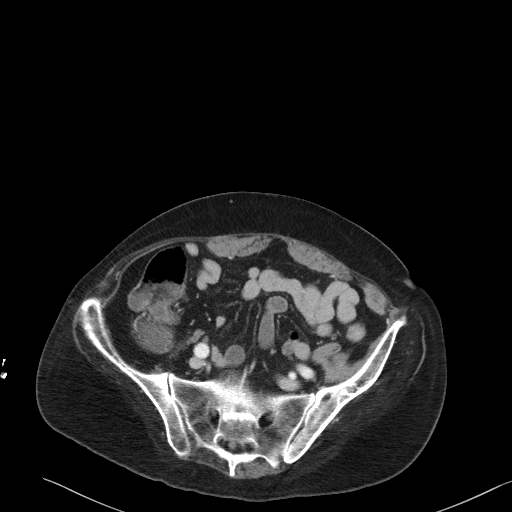
[im 36/83  soft-tissue]
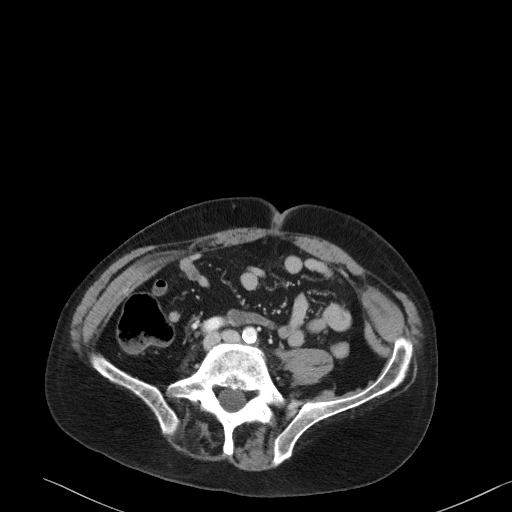
[im 43/83  soft-tissue]
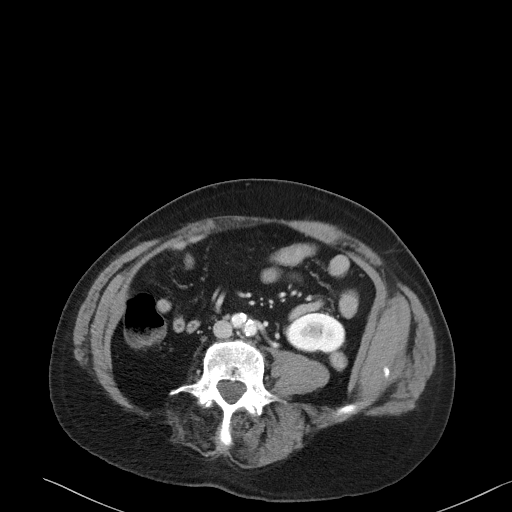
[im 47/83  soft-tissue]
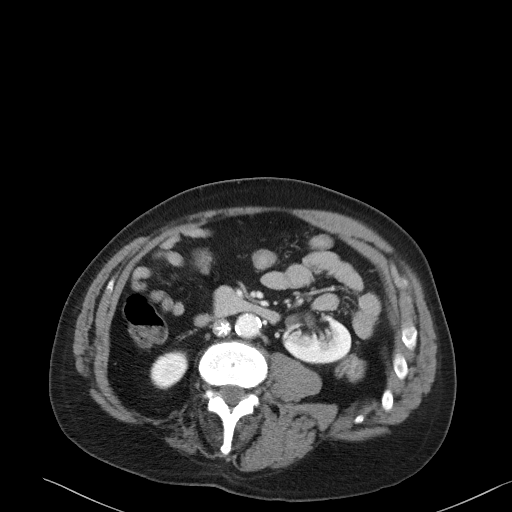
[im 55/83  soft-tissue]
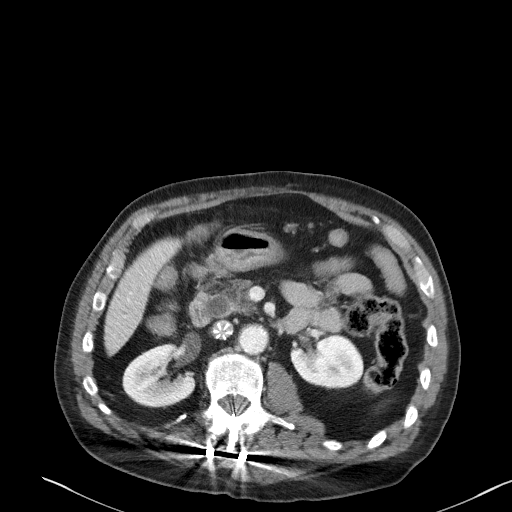
[im 55/83  bone]
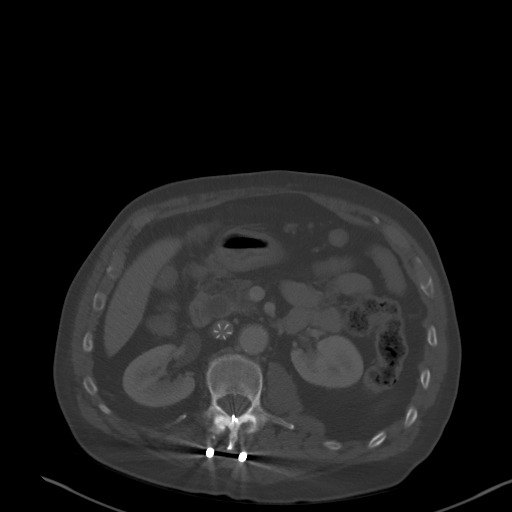
[im 59/83  soft-tissue]
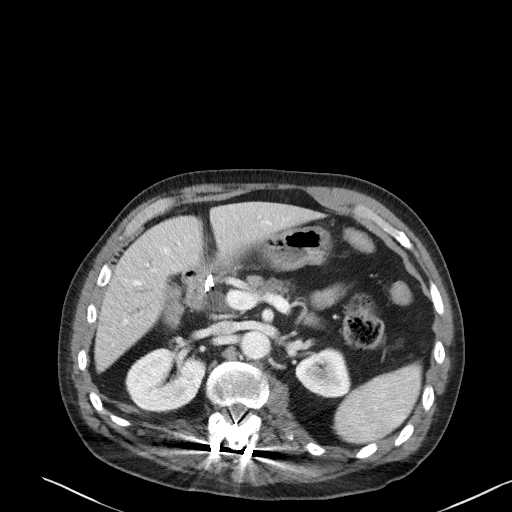
[im 67/83  soft-tissue]
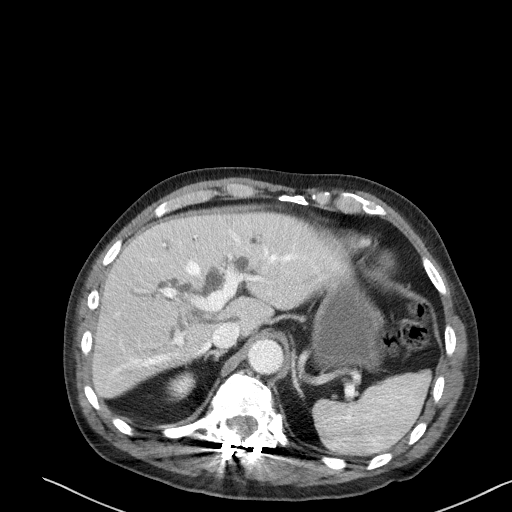
[im 71/83  soft-tissue]
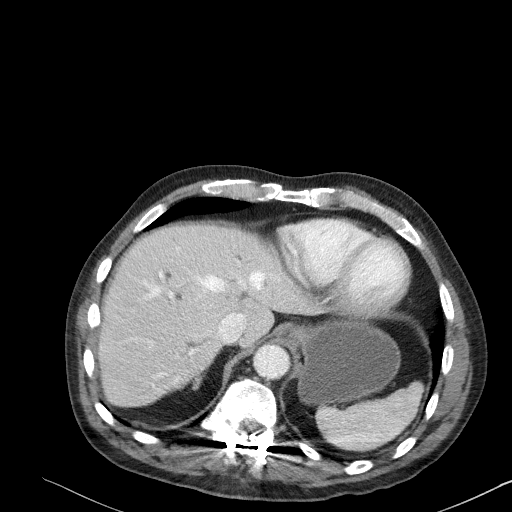
[im 79/83  soft-tissue]
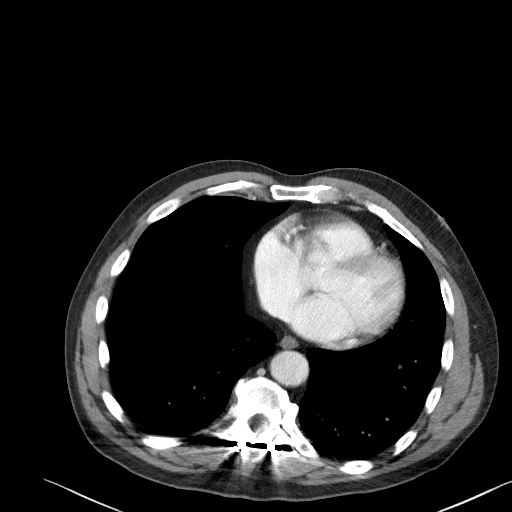

[Series 5: coronal st · coronal · 0.72mm/px · 3 of 81 slices shown]
[im 27/81  soft-tissue]
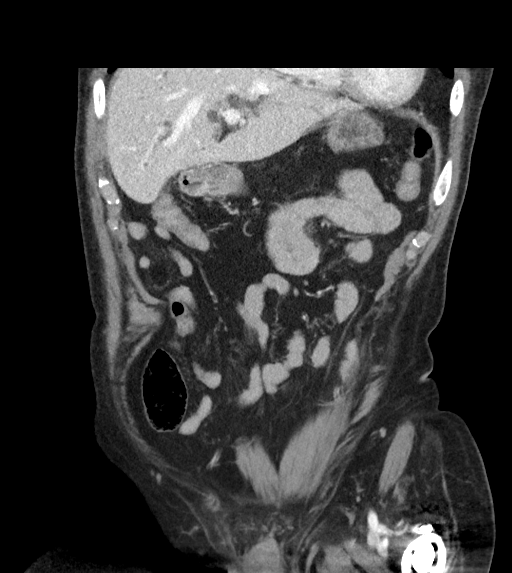
[im 36/81  soft-tissue]
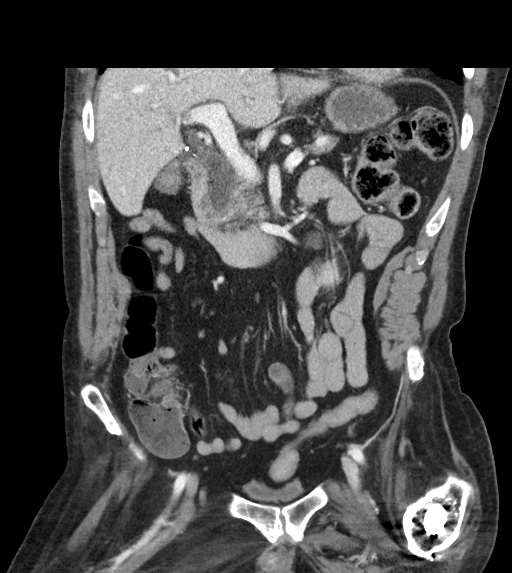
[im 45/81  soft-tissue]
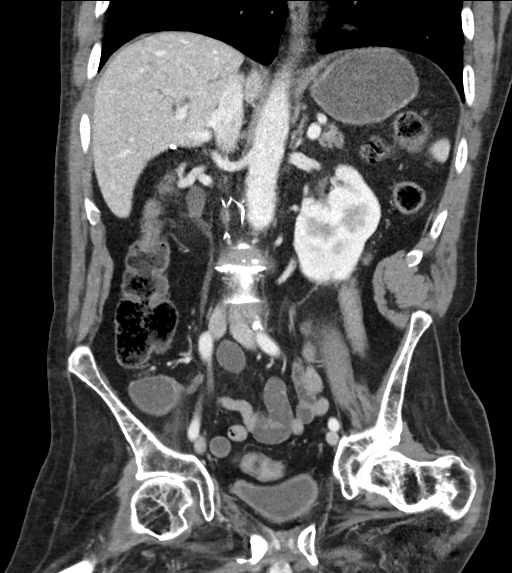

[16 of 46 positions shown; findings below may reference images not displayed]

FINDINGS: Lower chest: Lung bases demonstrate no acute consolidation or
pleural effusion. The heart is nonenlarged.

Hepatobiliary: Moderate intra and extrahepatic biliary dilatation
post cholecystectomy. Slight increased from March 2016 but similar
appearance in 0027. No focal hepatic abnormality.

Pancreas: Atrophic.  No inflammatory changes or ductal dilatation.

Spleen: Normal in size without focal abnormality.

Adrenals/Urinary Tract: Adrenal glands are unremarkable. Kidneys are
normal, without renal calculi, focal lesion, or hydronephrosis.
Bladder is unremarkable.

Stomach/Bowel: The stomach is nonenlarged. No dilated small bowel.
No colon wall thickening. Scattered stool in the colon.

Vascular/Lymphatic: Aortic atherosclerosis. IVC filter in the
infrarenal IVC. No significantly enlarged lymph nodes.

Reproductive: Prostate is unremarkable.

Other: No free air or free fluid.

Musculoskeletal: Proximal left femoral rod. Partially visualized
bright decubitus ulcer with thinning of the right inferior pubic
ramus, similar compared to prior study.
IMPRESSION: 1. Negative for bowel obstruction or bowel wall thickening. There is
scattered stool in the colon.
2. Slight increased intra hepatic biliary dilatation since the
previous CT but similar appearance on 0027 CT, findings likely
related to post cholecystectomy change although could be correlated
with laboratory values.
3. Partially visualized right gluteal skin defects with thinning of
underlying right ischial bone and inferior pubic ramus, no changed
and likely related to sequela of prior decubitus ulcer and
osteomyelitis.

## 2018-10-03 ENCOUNTER — Encounter (HOSPITAL_COMMUNITY): Payer: Self-pay | Admitting: Emergency Medicine

## 2018-10-03 ENCOUNTER — Other Ambulatory Visit: Payer: Self-pay

## 2018-10-03 ENCOUNTER — Emergency Department (HOSPITAL_COMMUNITY)
Admission: EM | Admit: 2018-10-03 | Discharge: 2018-10-03 | Disposition: A | Discharge status: Home / Self Care | Payer: Medicare Other | Attending: Emergency Medicine | Admitting: Emergency Medicine

## 2018-10-03 DIAGNOSIS — Z5321 Procedure and treatment not carried out due to patient leaving prior to being seen by health care provider: Secondary | ICD-10-CM | POA: Diagnosis not present

## 2018-10-03 DIAGNOSIS — R1110 Vomiting, unspecified: Secondary | ICD-10-CM | POA: Diagnosis present

## 2018-10-03 NOTE — ED Triage Notes (Signed)
Pt brought in by [facility]. [facility] reports when they arrive pt was having a panic attack. Pt reports he was eating chicken and felt like it got stuck in his throat. Pt attempted to make himself vomit so that he could the food out. Pt reports he got a small amount out and the rest "went down". Pt now c/o mid abdominal pain. Pt also c/o constipation for "a long time".  178/99, 80, 99%

## 2019-04-22 IMAGING — US US ABDOMEN COMPLETE
1 series · 14 of 25 positions shown · non-contrast
Comparison: CT 08/10/2016 .

CLINICAL DATA: Elevated transaminases. Chronic abdominal pain.
Cholecystectomy .

EXAM:
ABDOMEN ULTRASOUND COMPLETE

[Series 1: us abdomen complete · 0.21mm/px · 14 of 84 slices shown]
[im 1/84]
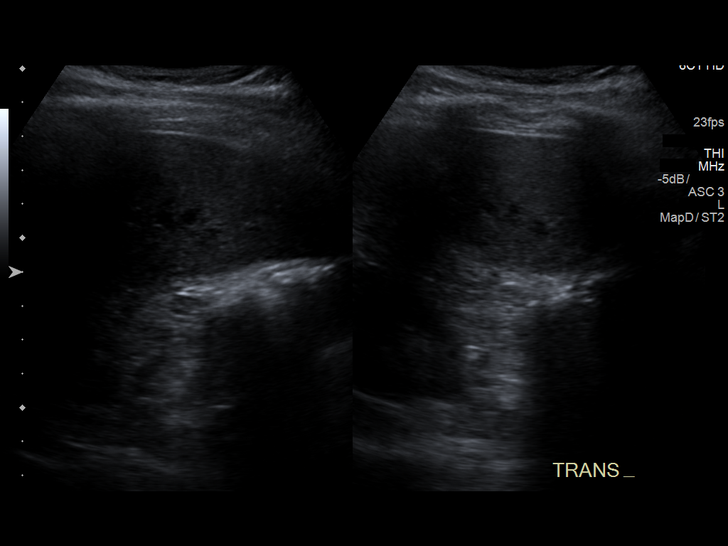
[im 7/84]
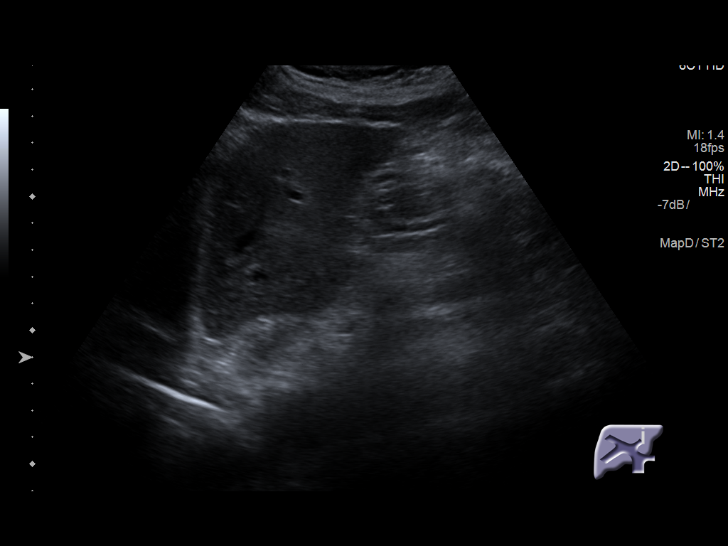
[im 14/84]
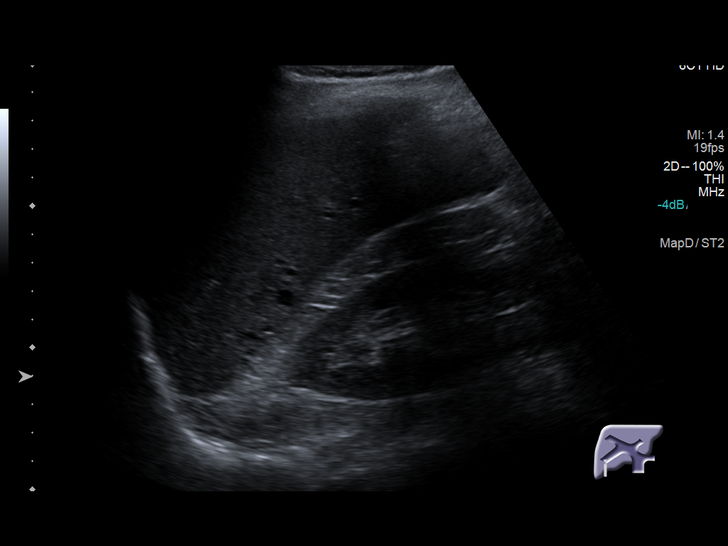
[im 21/84]
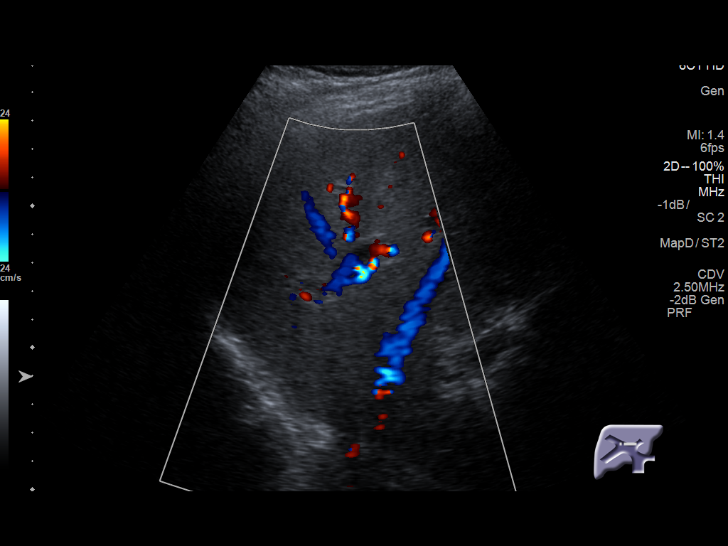
[im 28/84]
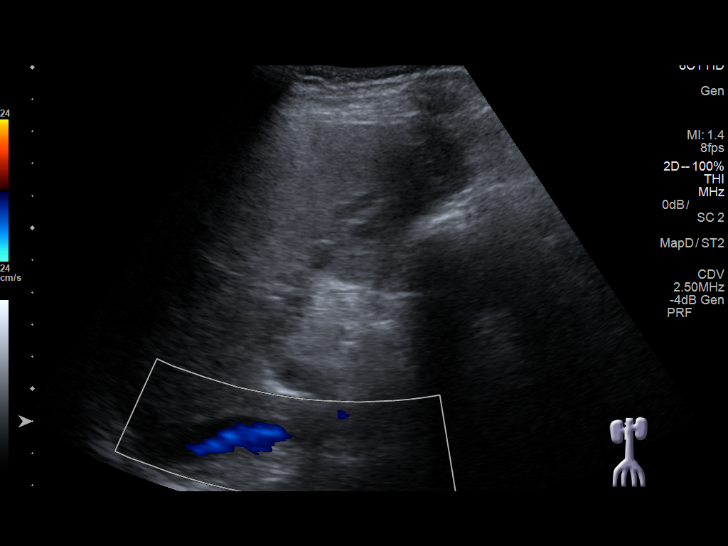
[im 32/84]
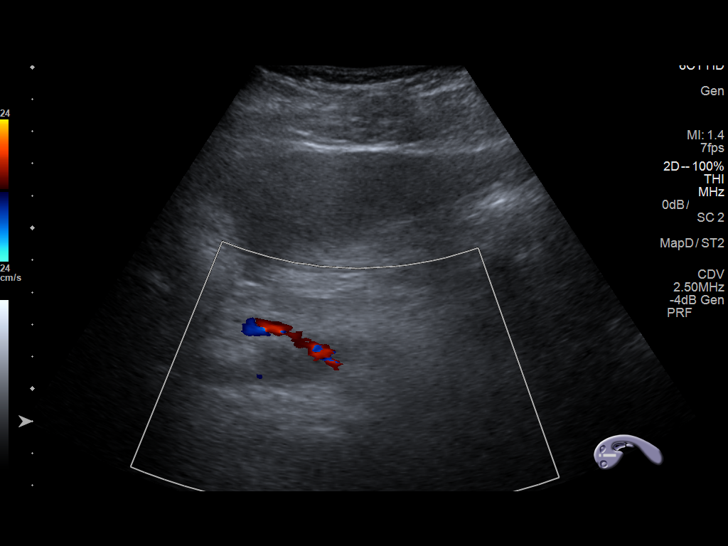
[im 39/84]
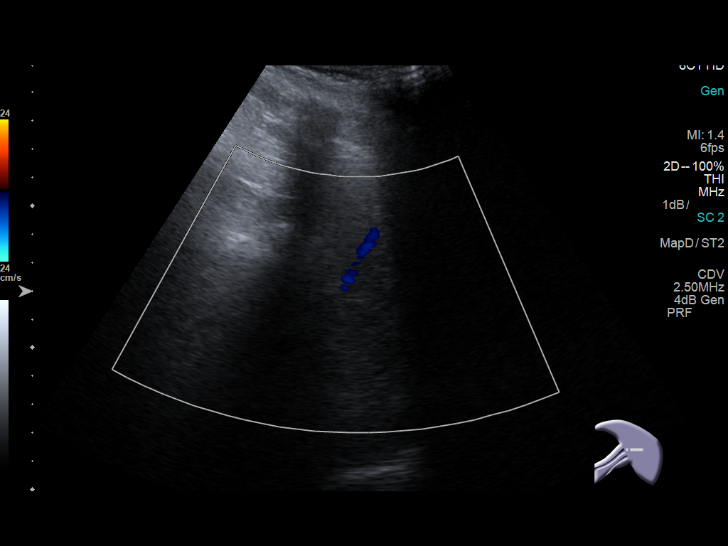
[im 45/84]
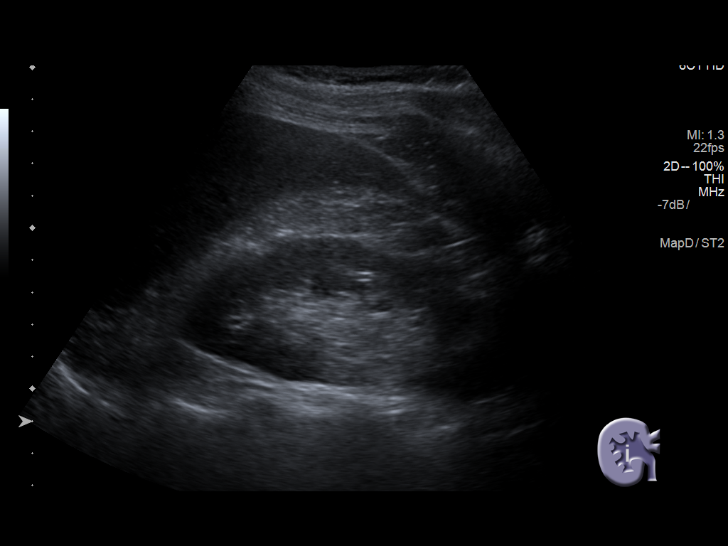
[im 52/84]
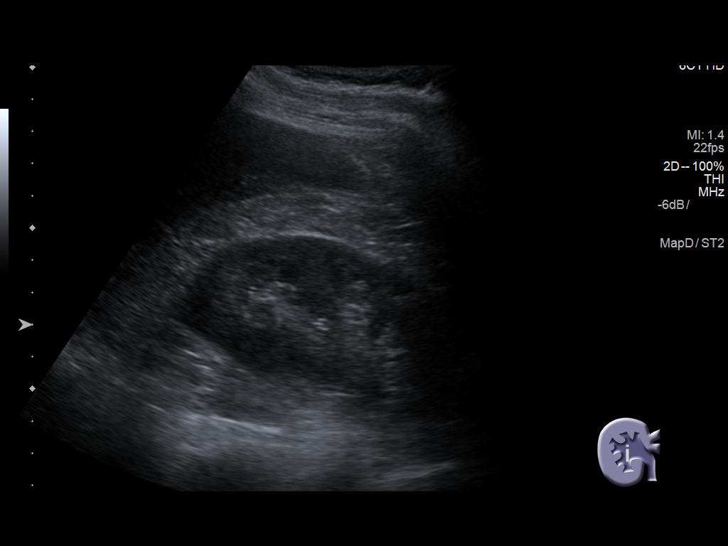
[im 56/84]
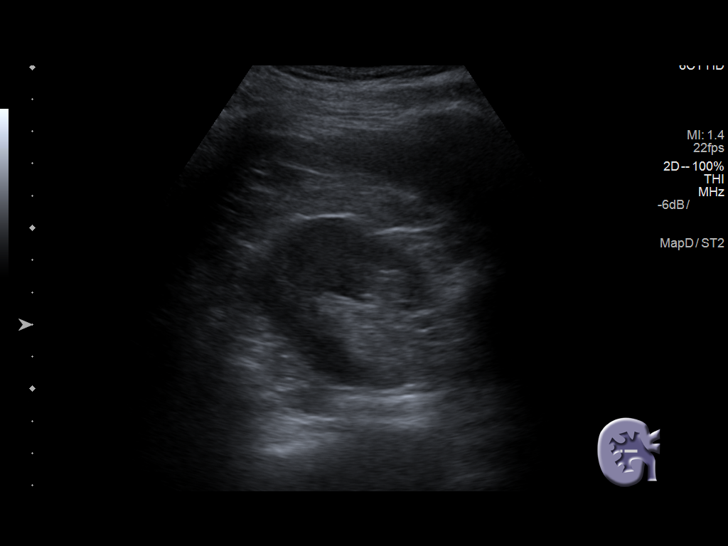
[im 63/84]
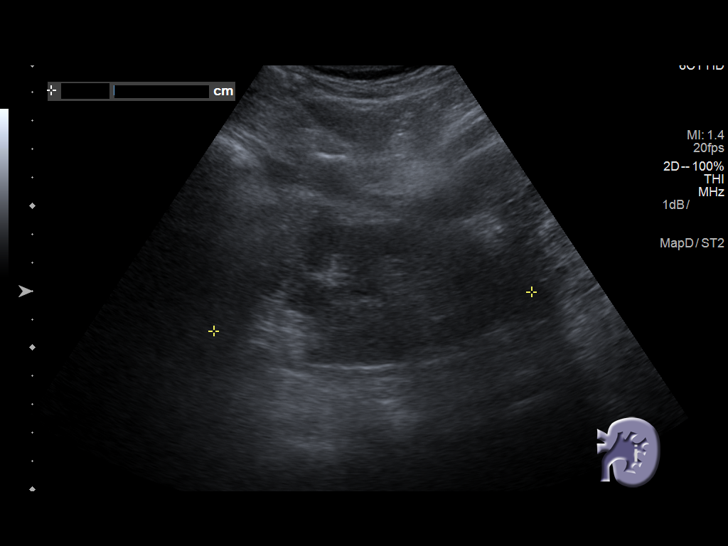
[im 70/84]
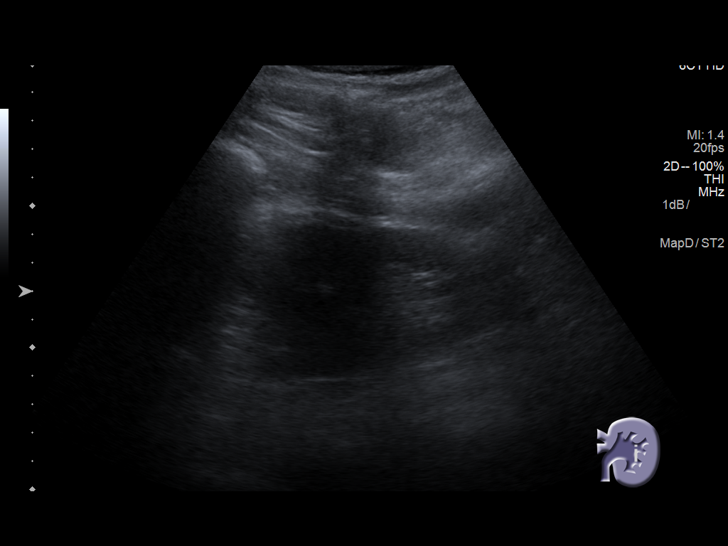
[im 77/84]
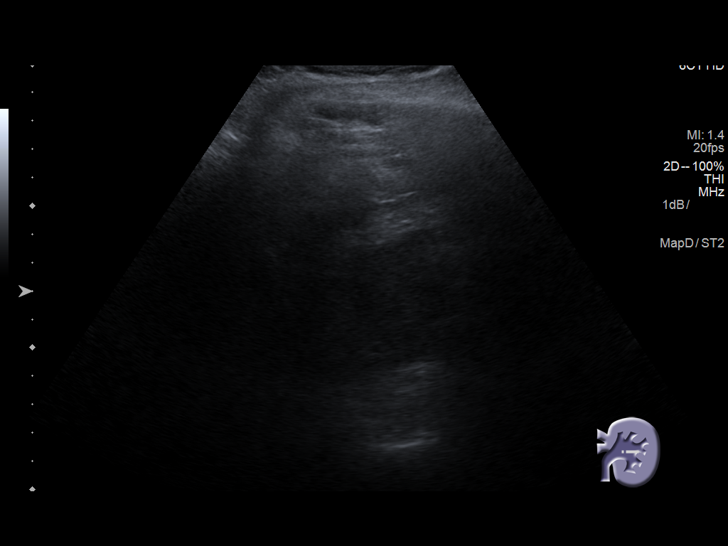
[im 84/84]
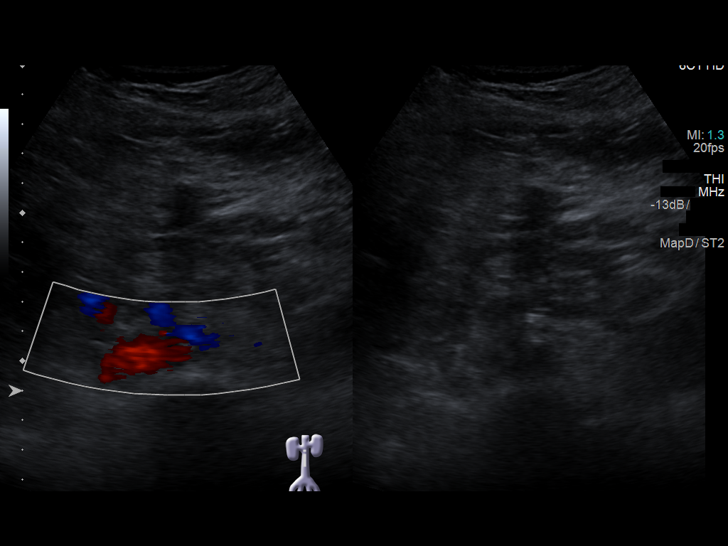

[14 of 25 positions shown; findings below may reference images not displayed]

FINDINGS: Gallbladder: Cholecystectomy.

Common bile duct: Diameter: 10.8 mm.

Liver: No focal lesion identified. Within normal limits in
parenchymal echogenicity mild intrahepatic biliary ductal dilatation

IVC: No abnormality visualized.

Pancreas: Visualized portion unremarkable.

Spleen: Size and appearance within normal limits.

Right Kidney: Length: 10.7 cm. Echogenicity within normal limits. No
mass or hydronephrosis visualized.

Left Kidney: Length: 11.1 cm. Echogenicity within normal limits. No
mass or hydronephrosis visualized.

Abdominal aorta: No aneurysm visualized.

Other findings: None.
IMPRESSION: 1. Cholecystectomy.

2. Mild intrahepatic and extrahepatic biliary ductal distention. No
obstructing lesion identified. Similar findings noted on today's CT.
Correlation with liver function tests suggested. MRCP may prove
useful for further evaluation .

## 2019-11-28 ENCOUNTER — Emergency Department (HOSPITAL_COMMUNITY): Payer: Medicare Other

## 2019-11-28 ENCOUNTER — Emergency Department (HOSPITAL_COMMUNITY)
Admission: EM | Admit: 2019-11-28 | Discharge: 2019-11-28 | Disposition: A | Discharge status: Home / Self Care | Payer: Medicare Other | Attending: Emergency Medicine | Admitting: Emergency Medicine

## 2019-11-28 ENCOUNTER — Other Ambulatory Visit: Payer: Self-pay

## 2019-11-28 ENCOUNTER — Encounter (HOSPITAL_COMMUNITY): Payer: Self-pay | Admitting: *Deleted

## 2019-11-28 DIAGNOSIS — H6122 Impacted cerumen, left ear: Secondary | ICD-10-CM | POA: Diagnosis not present

## 2019-11-28 DIAGNOSIS — Z87891 Personal history of nicotine dependence: Secondary | ICD-10-CM | POA: Diagnosis not present

## 2019-11-28 DIAGNOSIS — R531 Weakness: Secondary | ICD-10-CM | POA: Insufficient documentation

## 2019-11-28 DIAGNOSIS — J3489 Other specified disorders of nose and nasal sinuses: Secondary | ICD-10-CM | POA: Insufficient documentation

## 2019-11-28 DIAGNOSIS — G8220 Paraplegia, unspecified: Secondary | ICD-10-CM | POA: Insufficient documentation

## 2019-11-28 LAB — BASIC METABOLIC PANEL
Anion gap: 9 (ref 5–15)
BUN: 11 mg/dL (ref 6–20)
CO2: 26 mmol/L (ref 22–32)
Calcium: 8.9 mg/dL (ref 8.9–10.3)
Chloride: 99 mmol/L (ref 98–111)
Creatinine, Ser: 0.64 mg/dL (ref 0.61–1.24)
GFR calc Af Amer: >60 mL/min (ref >60)
GFR calc non Af Amer: >60 mL/min (ref >60)
Glucose, Bld: 84 mg/dL (ref 70–99)
Potassium: 4.3 mmol/L (ref 3.5–5.1)
Sodium: 134 mmol/L — ABNORMAL LOW (ref 135–145)

## 2019-11-28 LAB — CBC WITH DIFFERENTIAL/PLATELET
Abs Immature Granulocytes: 0.01 10*3/uL (ref 0.00–0.07)
Basophils Absolute: 0 10*3/uL (ref 0.0–0.1)
Basophils Relative: 0 %
Eosinophils Absolute: 0.1 10*3/uL (ref 0.0–0.5)
Eosinophils Relative: 1 %
HCT: 39.3 % (ref 39.0–52.0)
Hemoglobin: 12.6 g/dL — ABNORMAL LOW (ref 13.0–17.0)
Immature Granulocytes: 0 %
Lymphocytes Relative: 26 %
Lymphs Abs: 1.1 10*3/uL (ref 0.7–4.0)
MCH: 27 pg (ref 26.0–34.0)
MCHC: 32.1 g/dL (ref 30.0–36.0)
MCV: 84.2 fL (ref 80.0–100.0)
Monocytes Absolute: 0.4 10*3/uL (ref 0.1–1.0)
Monocytes Relative: 9 %
Neutro Abs: 2.7 10*3/uL (ref 1.7–7.7)
Neutrophils Relative %: 64 %
Platelets: 259 10*3/uL (ref 150–400)
RBC: 4.67 MIL/uL (ref 4.22–5.81)
RDW: 13.9 % (ref 11.5–15.5)
WBC: 4.2 10*3/uL (ref 4.0–10.5)
nRBC: 0 % (ref 0.0–0.2)

## 2019-11-28 LAB — URINALYSIS, ROUTINE W REFLEX MICROSCOPIC
Bacteria, UA: NONE SEEN
Bilirubin Urine: NEGATIVE
Glucose, UA: NEGATIVE mg/dL
Ketones, ur: 20 mg/dL — AB
Leukocytes,Ua: NEGATIVE
Nitrite: NEGATIVE
Protein, ur: NEGATIVE mg/dL
Specific Gravity, Urine: 1.019 (ref 1.005–1.030)
pH: 5 (ref 5.0–8.0)

## 2019-11-28 MED ORDER — LORATADINE 10 MG PO TABS: 10.0000 mg | ORAL_TABLET | Freq: Every day | ORAL | 0 refills | Status: DC

## 2019-11-28 MED ORDER — SODIUM CHLORIDE 0.9 % IV BOLUS
1000.0000 mL | Freq: Once | INTRAVENOUS | Status: AC
  Administered 2019-11-28: 13:00:00 1000 mL via INTRAVENOUS

## 2019-11-28 NOTE — ED Provider Notes (Signed)
[facility] EMERGENCY DEPARTMENT Provider Note   CSN: [id number] Arrival date & time: 11/28/19  1023     History Chief Complaint  Patient presents with  . Weakness    [name] is a 59 y.o. male with a history of GERD, Hepatitis C, paraplegia and h/o etoh abuse presenting for evaluation of generalized weakness with burning eyes and dry throat which he woke with today.  He reports he recently had a mold issue in his home, and shows me pictures of mold growing in his bedroom wall, before and after killed seen and painting, also states had mold on his mattress which has been replaced.  However he is concerned about his symptoms being related to the mold exposure.  He denies shortness of breath, has no chest pain, no significant cough, he does endorse clear nasal rhinorrhea along with bilateral burning eyes without visual change or drainage since waking today.  Reports intermittent pressure sensation in his left ear which he had noticed for several weeks.  No nausea or vomiting, no abdominal pain, good appetite.  He has had no treatment for symptoms prior to arrival.  HPI     Past Medical History:  Diagnosis Date  . Anemia    Patient states that he was recently told this  . Anxiety   . Arthritis   . Blood transfusion without reported diagnosis   . Depression   . GERD (gastroesophageal reflux disease)   . Hepatitis C   . MRSA infection   . Neuromuscular disorder (HCC)   . Osteomyelitis (HCC)   . Paralysis (HCC)   . Paraparesis (HCC)    parapalegic   . 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  . Substance abuse (HCC)    drinking,     Patient Active Problem List   Diagnosis Date Noted  . Pain syndrome, chronic 09/14/2016  . Choledocholithiasis with acute cholecystitis 08/11/2016  . Rectal bleeding 06/01/2016  . Abdominal pain 01/28/2015  . Hepatitis C 05/17/2013  . Closed supracondylar fracture of right femur (HCC) 09/25/2012     Class: Acute  . Right radial head fracture 07/05/2012  . Chronic hepatitis C (HCC) 05/10/2011  . Decubitus ulcer 05/10/2011  . Femur fracture, left (HCC) 06/30/2010  . Paraplegia (HCC) 06/30/2010  . CALCU BD WITHOUT MENTION CHOLECYST/OBSTRUCTION 05/22/2008  . CLOSED FRACTURE OF SHAFT OF TIBIA 03/22/2007    Past Surgical History:  Procedure Laterality Date  . BACK SURGERY    . BALLOON DILATION N/A 08/11/2016   Procedure: BALLOON DILATION;  Surgeon: Malissa Hippo, MD;  Location: [facility];  Service: Endoscopy;  Laterality: N/A;  . CHOLECYSTECTOMY    . ERCP N/A 08/11/2016   Procedure: ENDOSCOPIC RETROGRADE CHOLANGIOPANCREATOGRAPHY (ERCP) extended Sphincterotomy;  Surgeon: Malissa Hippo, MD;  Location: [facility];  Service: Endoscopy;  Laterality: N/A;  . FEMUR FRACTURE SURGERY  March 2012   retrograde intramedullary femoral nail  . FRACTURE SURGERY     broke back, fusion  . GALLBLADDER SURGERY  2010  . HERNIA REPAIR     very young  . ORIF FEMUR FRACTURE Right 08/04/2012   Procedure: OPEN REDUCTION INTERNAL FIXATION (ORIF) DISTAL FEMUR FRACTURE;  Surgeon: Eldred Manges, MD;  Location: [facility] OR;  Service: Orthopedics;  Laterality: Right;  Right Retrograde Supracondylar Nail Right Femur       Family History  Problem Relation Age of Onset  . Ovarian cancer Mother   . HIV Sister   .  Healthy Sister   . Hodgkin's lymphoma Sister   . Heart disease Sister   . Healthy Sister   . Healthy Son   . Healthy Daughter   . Anemia Daughter   . Thyroid disease Daughter   . Cancer Other        family history     Social History   Tobacco Use  . Smoking status: Former Smoker    Packs/day: 1.00    Years: 5.00    Pack years: 5.00    Types: Cigarettes    Quit date: 08/10/2010    Years since quitting: 9.3  . Smokeless tobacco: Never Used  Substance Use Topics  . Alcohol use: Not Currently    Comment: Patient quit drinking 1 year ago  . Drug use: No    Home Medications Prior to Admission  medications   Medication Sig Start Date End Date Taking? Authorizing Provider  methadone (DOLOPHINE) 10 MG tablet Take 20 mg by mouth 3 (three) times daily.   Yes [provider]  pregabalin (LYRICA) 75 MG capsule Take 75 mg by mouth 3 (three) times daily.    Yes [provider]  loratadine (CLARITIN) 10 MG tablet Take 1 tablet (10 mg total) by mouth daily. 11/28/19   Burgess AmorIdol, Allyiah Gartner, PA-C    Allergies    Patient has no known allergies.  Review of Systems   Review of Systems  Constitutional: Negative for appetite change, chills and fever.  HENT: Positive for ear pain and rhinorrhea. Negative for congestion and sore throat.   Eyes: Positive for pain. Negative for photophobia, discharge, redness and visual disturbance.  Respiratory: Negative for cough, chest tightness and shortness of breath.   Cardiovascular: Negative for chest pain.  Gastrointestinal: Negative for abdominal pain, nausea and vomiting.  Genitourinary: Negative.   Musculoskeletal: Negative for arthralgias, joint swelling and neck pain.  Skin: Negative.  Negative for rash and wound.  Neurological: Positive for weakness. Negative for dizziness, light-headedness, numbness and headaches.  Psychiatric/Behavioral: Negative.   All other systems reviewed and are negative.   Physical Exam Updated Vital Signs BP (!) 121/94 (BP Location: Right Arm)   Pulse 70   Temp (!) 97.5 F (36.4 C) (Oral)   Resp 18   Ht 5\' 9"  (1.753 m)   Wt 59 kg   SpO2 95%   BMI 19.21 kg/m   Physical Exam Constitutional:      Appearance: Normal appearance. He is well-developed.  HENT:     Head: Normocephalic and atraumatic.     Right Ear: Tympanic membrane and ear canal normal.     Left Ear: Tympanic membrane normal. There is impacted cerumen.     Nose: Mucosal edema present. No rhinorrhea.     Mouth/Throat:     Mouth: Mucous membranes are moist.     Pharynx: Uvula midline. No oropharyngeal exudate or posterior oropharyngeal  erythema.     Tonsils: No tonsillar abscesses.  Eyes:     General: Lids are normal. No scleral icterus.       Right eye: No discharge.        Left eye: No discharge.     Extraocular Movements: Extraocular movements intact.     Conjunctiva/sclera: Conjunctivae normal.     Right eye: Right conjunctiva is not injected. No chemosis or exudate.    Left eye: Left conjunctiva is not injected. No chemosis or exudate.    Comments: Normal eye exam  Cardiovascular:     Rate and Rhythm: Normal rate.  Heart sounds: Normal heart sounds.  Pulmonary:     Effort: Pulmonary effort is normal. No respiratory distress.     Breath sounds: No wheezing or rales.  Musculoskeletal:        General: No swelling or tenderness.     Comments: Paraplegia. Pt presents in wheelchair, transfers self.  Skin:    General: Skin is warm and dry.     Findings: No rash.  Neurological:     General: No focal deficit present.     Mental Status: He is alert and oriented to person, place, and time.     ED Results / Procedures / Treatments   Labs (all labs ordered are listed, but only abnormal results are displayed) Labs Reviewed  URINALYSIS, ROUTINE W REFLEX MICROSCOPIC - Abnormal; Notable for the following components:      Result Value   Hgb urine dipstick SMALL (*)    Ketones, ur 20 (*)    All other components within normal limits  CBC WITH DIFFERENTIAL/PLATELET - Abnormal; Notable for the following components:   Hemoglobin 12.6 (*)    All other components within normal limits  BASIC METABOLIC PANEL - Abnormal; Notable for the following components:   Sodium 134 (*)    All other components within normal limits    EKG None  Radiology DG Chest Portable 1 View  Result Date: 11/28/2019 CLINICAL DATA:  Chest pain. EXAM: PORTABLE CHEST 1 VIEW COMPARISON:  01/13/2018 FINDINGS: The heart size and mediastinal contours are within normal limits. Aortic atherosclerosis. Both lungs are clear. No visible pleural  effusions. No pneumothorax. Biapical pleuroparenchymal scarring. The visualized skeletal structures are unremarkable. Fixation rods and wires sutures involving the visualized thoracolumbar spine, similar to prior. IMPRESSION: No acute cardiopulmonary disease. Electronically Signed   By: Feliberto Harts MD   On: 11/28/2019 11:22    Procedures .Ear Cerumen Removal  Date/Time: 11/28/2019 2:10 PM Performed by: Burgess Amor, PA-C Authorized by: Burgess Amor, PA-C   Consent:    Consent obtained:  Verbal   Consent given by:  Patient   Risks discussed:  Bleeding, pain, TM perforation, dizziness and incomplete removal   Alternatives discussed:  No treatment Procedure details:    Location:  L ear   Procedure type: curette     Procedure type comment:  And irrigation Post-procedure details:    Inspection:  TM intact   Hearing quality:  Improved   Patient tolerance of procedure:  Tolerated well, no immediate complications Comments:     Moderate amount of dark soft cerumen removed.  Pt tolerated well.   (including critical care time)     Medications Ordered in ED Medications  sodium chloride 0.9 % bolus 1,000 mL (0 mLs Intravenous Stopped 11/28/19 1352)    ED Course  I have reviewed the triage vital signs and the nursing notes.  Pertinent labs & imaging results that were available during my care of the patient were reviewed by me and considered in my medical decision making (see chart for details).    MDM Rules/Calculators/A&P                          Pt with multiple complaints, eye burning, rhinorrhea, weakness with normal exam. No headache or dizziness.  He does not wear contacts.  No congestion, no fevers, sob, cough.  He was given IV fluids given weakness and ketonuria. Felt improved at time of dc.  The patient appears reasonably screened and/or stabilized for discharge and  I doubt any other medical condition or other [facility] requiring further screening, evaluation, or treatment in  the ED at this time prior to discharge.  Final Clinical Impression(s) / ED Diagnoses Final diagnoses:  Generalized weakness  Impacted cerumen of left ear    Rx / DC Orders ED Discharge Orders         Ordered    loratadine (CLARITIN) 10 MG tablet  Daily        11/28/19 1339           Burgess Amor, Cordelia Poche 11/28/19 1459    Sabas Sous, MD 11/28/19 [phone]

## 2019-11-28 NOTE — Discharge Instructions (Signed)
Your exam, labwork and chest xray are reassuring today with no indication of significant dehydration.  Your chest xray is clear.  I do recommend an eye lubricating drop as needed for eye irritation or persistent burning. You may also want to consider this being an allergen reaction from the mold you had in your home - adding claritin may help with your eye and nasal symptoms and has been prescribed if you want to try this.  Follow up with Dr Janna Arch for any new or persistent symptoms.

## 2019-11-28 NOTE — ED Triage Notes (Signed)
Pt states he woke up this am with his eyes burning and his throat dry; pt also states he feels weak

## 2019-12-19 ENCOUNTER — Encounter: Payer: Self-pay | Admitting: Orthopedic Surgery

## 2020-01-16 ENCOUNTER — Ambulatory Visit (INDEPENDENT_AMBULATORY_CARE_PROVIDER_SITE_OTHER): Payer: Medicare Other | Admitting: Orthopedic Surgery

## 2020-01-16 ENCOUNTER — Ambulatory Visit: Payer: Medicare Other

## 2020-01-16 ENCOUNTER — Encounter: Payer: Self-pay | Admitting: Orthopedic Surgery

## 2020-01-16 ENCOUNTER — Other Ambulatory Visit: Payer: Self-pay

## 2020-01-16 VITALS — BP 126/85 | HR 102 | Ht 69.0 in

## 2020-01-16 DIAGNOSIS — S72401S Unspecified fracture of lower end of right femur, sequela: Secondary | ICD-10-CM | POA: Diagnosis not present

## 2020-01-16 DIAGNOSIS — M79604 Pain in right leg: Secondary | ICD-10-CM

## 2020-01-16 DIAGNOSIS — S72401D Unspecified fracture of lower end of right femur, subsequent encounter for closed fracture with routine healing: Secondary | ICD-10-CM

## 2020-01-16 DIAGNOSIS — S72402D Unspecified fracture of lower end of left femur, subsequent encounter for closed fracture with routine healing: Secondary | ICD-10-CM

## 2020-01-16 NOTE — Progress Notes (Signed)
Chief Complaint  Patient presents with  . Leg Problem    has had surgery on Right femur by Dr Ophelia Charter, wants Dr Romeo Apple to look at it patient states the right femur is now longer than the left     59 year old male paraplegic had bilateral femur fractures wound treated by Dr. Ophelia Charter with a femoral nailing 1 by Dr. Romeo Apple 2012/ of the femoral nailing the most recent one by Dr. Ophelia Charter 2014  Patient concerned about leg length discrepancy  Past Medical History:  Diagnosis Date  . Anemia    Patient states that he was recently told this  . Anxiety   . Arthritis   . Blood transfusion without reported diagnosis   . Depression   . GERD (gastroesophageal reflux disease)   . Hepatitis C   . MRSA infection   . Neuromuscular disorder (HCC)   . Osteomyelitis (HCC)   . Paralysis (HCC)   . Paraparesis (HCC)    parapalegic   . 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  . Substance abuse (HCC)    drinking,    Past Surgical History:  Procedure Laterality Date  . BACK SURGERY    . BALLOON DILATION N/A 08/11/2016   Procedure: BALLOON DILATION;  Surgeon: Malissa Hippo, MD;  Location: [facility];  Service: Endoscopy;  Laterality: N/A;  . CHOLECYSTECTOMY    . ERCP N/A 08/11/2016   Procedure: ENDOSCOPIC RETROGRADE CHOLANGIOPANCREATOGRAPHY (ERCP) extended Sphincterotomy;  Surgeon: Malissa Hippo, MD;  Location: [facility];  Service: Endoscopy;  Laterality: N/A;  . FEMUR FRACTURE SURGERY  March 2012   retrograde intramedullary femoral nail  . FRACTURE SURGERY     broke back, fusion  . GALLBLADDER SURGERY  2010  . HERNIA REPAIR     very young  . ORIF FEMUR FRACTURE Right 08/04/2012   Procedure: OPEN REDUCTION INTERNAL FIXATION (ORIF) DISTAL FEMUR FRACTURE;  Surgeon: Eldred Manges, MD;  Location: [facility] OR;  Service: Orthopedics;  Laterality: Right;  Right Retrograde Supracondylar Nail Right Femur    Current Outpatient Medications:  .  methadone (DOLOPHINE) 10 MG tablet,  Take 20 mg by mouth 3 (three) times daily., Disp: , Rfl:  .  pregabalin (LYRICA) 75 MG capsule, Take 75 mg by mouth 3 (three) times daily. , Disp: , Rfl:  .  loratadine (CLARITIN) 10 MG tablet, Take 1 tablet (10 mg total) by mouth daily. (Patient not taking: Reported on 01/16/2020), Disp: 30 tablet, Rfl: 0 Family History  Problem Relation Age of Onset  . Ovarian cancer Mother   . HIV Sister   . Healthy Sister   . Hodgkin's lymphoma Sister   . Heart disease Sister   . Healthy Sister   . Healthy Son   . Healthy Daughter   . Anemia Daughter   . Thyroid disease Daughter   . Cancer Other        family history    Social History   Tobacco Use  . Smoking status: Former Smoker    Packs/day: 1.00    Years: 5.00    Pack years: 5.00    Types: Cigarettes    Quit date: 08/10/2010    Years since quitting: 9.4  . Smokeless tobacco: Never Used  Substance Use Topics  . Alcohol use: Not Currently    Comment: Patient quit drinking 1 year ago  . Drug use: No   Encounter Diagnoses  Name Primary?  . Pain in right leg Yes  .  Closed fracture of distal end of right femur with routine healing, unspecified fracture morphology, subsequent encounter   . Closed fracture of distal end of left femur with routine healing, unspecified fracture morphology, subsequent encounter    Examination reveals a 59 year old male in a wheelchair he does have some movement in his lower extremities his right leg looks longer than the other but he really has pelvic obliquity    Images today show a well-healed right femur fracture with no complications from the hardware  Encounter Diagnoses  Name Primary?  . Pain in right leg Yes  . Closed fracture of distal end of right femur with routine healing, unspecified fracture morphology, subsequent encounter   . Closed fracture of distal end of left femur with routine healing, unspecified fracture morphology, subsequent encounter     No issues with surgery on the right  follow-up as needed

## 2021-03-09 ENCOUNTER — Emergency Department (HOSPITAL_COMMUNITY)
Admission: EM | Admit: 2021-03-09 | Discharge: 2021-03-09 | Disposition: A | Discharge status: Home / Self Care | Payer: Medicare Other | Attending: Emergency Medicine | Admitting: Emergency Medicine

## 2021-03-09 ENCOUNTER — Encounter (HOSPITAL_COMMUNITY): Payer: Self-pay | Admitting: *Deleted

## 2021-03-09 ENCOUNTER — Emergency Department (HOSPITAL_COMMUNITY): Payer: Medicare Other

## 2021-03-09 DIAGNOSIS — L89154 Pressure ulcer of sacral region, stage 4: Secondary | ICD-10-CM | POA: Diagnosis not present

## 2021-03-09 DIAGNOSIS — L89159 Pressure ulcer of sacral region, unspecified stage: Secondary | ICD-10-CM | POA: Diagnosis present

## 2021-03-09 LAB — CBC WITH DIFFERENTIAL/PLATELET
Abs Immature Granulocytes: 0.02 10*3/uL (ref 0.00–0.07)
Basophils Absolute: 0 10*3/uL (ref 0.0–0.1)
Basophils Relative: 0 %
Eosinophils Absolute: 0 10*3/uL (ref 0.0–0.5)
Eosinophils Relative: 0 %
HCT: 38.1 % — ABNORMAL LOW (ref 39.0–52.0)
Hemoglobin: 12.4 g/dL — ABNORMAL LOW (ref 13.0–17.0)
Immature Granulocytes: 0 %
Lymphocytes Relative: 6 %
Lymphs Abs: 0.5 10*3/uL — ABNORMAL LOW (ref 0.7–4.0)
MCH: 27.3 pg (ref 26.0–34.0)
MCHC: 32.5 g/dL (ref 30.0–36.0)
MCV: 83.9 fL (ref 80.0–100.0)
Monocytes Absolute: 0.5 10*3/uL (ref 0.1–1.0)
Monocytes Relative: 6 %
Neutro Abs: 7 10*3/uL (ref 1.7–7.7)
Neutrophils Relative %: 88 %
Platelets: 295 10*3/uL (ref 150–400)
RBC: 4.54 MIL/uL (ref 4.22–5.81)
RDW: 14.1 % (ref 11.5–15.5)
WBC: 8 10*3/uL (ref 4.0–10.5)
nRBC: 0 % (ref 0.0–0.2)

## 2021-03-09 MED ORDER — AMOXICILLIN-POT CLAVULANATE 875-125 MG PO TABS
1.0000 | ORAL_TABLET | Freq: Once | ORAL | Status: AC
Start: 2021-03-09 — End: 2021-03-09
  Administered 2021-03-09: 1 via ORAL
  Filled 2021-03-09: qty 1

## 2021-03-09 MED ORDER — FENTANYL CITRATE PF 50 MCG/ML IJ SOSY
100.0000 ug | PREFILLED_SYRINGE | Freq: Once | INTRAMUSCULAR | Status: AC
Start: 2021-03-09 — End: 2021-03-09
  Administered 2021-03-09: 100 ug via INTRAVENOUS
  Filled 2021-03-09: qty 2

## 2021-03-09 MED ORDER — FENTANYL CITRATE PF 50 MCG/ML IJ SOSY: 50.0000 ug | PREFILLED_SYRINGE | Freq: Once | INTRAMUSCULAR | Status: DC

## 2021-03-09 MED ORDER — OXYCODONE-ACETAMINOPHEN 5-325 MG PO TABS
1.0000 | ORAL_TABLET | Freq: Once | ORAL | Status: AC
  Administered 2021-03-09: 1 via ORAL
  Filled 2021-03-09: qty 1

## 2021-03-09 MED ORDER — DOXYCYCLINE HYCLATE 100 MG PO CAPS: 100.0000 mg | ORAL_CAPSULE | Freq: Two times a day (BID) | ORAL | 0 refills | Status: DC

## 2021-03-09 MED ORDER — FENTANYL CITRATE PF 50 MCG/ML IJ SOSY
50.0000 ug | PREFILLED_SYRINGE | Freq: Once | INTRAMUSCULAR | Status: AC
Start: 2021-03-09 — End: 2021-03-09
  Administered 2021-03-09: 50 ug via INTRAVENOUS
  Filled 2021-03-09: qty 1

## 2021-03-09 MED ORDER — ACETAMINOPHEN 500 MG PO TABS
1000.0000 mg | ORAL_TABLET | Freq: Once | ORAL | Status: AC
Start: 2021-03-09 — End: 2021-03-09
  Administered 2021-03-09: 1000 mg via ORAL
  Filled 2021-03-09: qty 2

## 2021-03-09 MED ORDER — DOXYCYCLINE HYCLATE 100 MG PO TABS
100.0000 mg | ORAL_TABLET | Freq: Once | ORAL | Status: AC
  Administered 2021-03-09: 100 mg via ORAL
  Filled 2021-03-09: qty 1

## 2021-03-09 MED ORDER — AMOXICILLIN-POT CLAVULANATE 875-125 MG PO TABS: 1.0000 | ORAL_TABLET | Freq: Two times a day (BID) | ORAL | 0 refills | Status: DC

## 2021-03-09 NOTE — Discharge Instructions (Signed)
Your x-ray did not show any changes, showing no acute infection or osteomyelitis. Your blood work was reassuring, there is no leukocytosis (elevated white blood cell count) It is important that you take the antibiotics as prescribed.  Take the entire course, even if symptoms improve. Continue taking your home pain medications. Follow-up as scheduled at [facility] for further evaluation of your wound. Return to the emergency room with any new, worsening, concerning symptoms

## 2021-03-09 NOTE — ED Provider Notes (Signed)
[facility] EMERGENCY DEPARTMENT Provider Note   CSN: [id number] Arrival date & time: 03/09/21  1651     History  Chief Complaint  Patient presents with   Pressure Ulcer    [name] is a 61 y.o. male presenting for sacral pain.  Patient states he has had sacral ulcers for over 10 years.  His ulcer became severely painful last night.  Pain is constant, nothing makes it better including his methadone or Lyrica.  He states it is now draining a foul-smelling liquid.  No fevers or chills.  No nausea or vomiting.  No weakness or confusion.  He follows with [facility] wound care and has an appointment next week with [facility] for wound care. Pt is paraplegic.   HPI     Home Medications Prior to Admission medications   Medication Sig Start Date End Date Taking? Authorizing Provider  amoxicillin-clavulanate (AUGMENTIN) 875-125 MG tablet Take 1 tablet by mouth every 12 (twelve) hours. 03/09/21  Yes Deniel Mcquiston, PA-C  doxycycline (VIBRAMYCIN) 100 MG capsule Take 1 capsule (100 mg total) by mouth 2 (two) times daily. 03/09/21  Yes Moe Brier, PA-C  loratadine (CLARITIN) 10 MG tablet Take 1 tablet (10 mg total) by mouth daily. Patient not taking: Reported on 01/16/2020 11/28/19   Evalee Jefferson, PA-C  methadone (DOLOPHINE) 10 MG tablet Take 20 mg by mouth 3 (three) times daily.    [provider]  pregabalin (LYRICA) 75 MG capsule Take 75 mg by mouth 3 (three) times daily.     [provider]      Allergies    Patient has no known allergies.    Review of Systems   Review of Systems  Constitutional:  Negative for fever.  Skin:  Positive for wound.  All other systems reviewed and are negative.  Physical Exam Updated Vital Signs BP (!) 104/53    Pulse 99    Temp 99.5 F (37.5 C) (Oral)    Resp 19    SpO2 100%  Physical Exam Vitals and nursing note reviewed.  Constitutional:      General: He is not in acute distress.    Appearance: Normal  appearance.     Comments: Nontoxic  HENT:     Head: Normocephalic and atraumatic.  Eyes:     Conjunctiva/sclera: Conjunctivae normal.     Pupils: Pupils are equal, round, and reactive to light.  Cardiovascular:     Rate and Rhythm: Normal rate and regular rhythm.     Pulses: Normal pulses.  Pulmonary:     Effort: Pulmonary effort is normal. No respiratory distress.     Breath sounds: Normal breath sounds. No wheezing.     Comments: Speaking in full sentences.  Clear lung sounds in all fields. Abdominal:     General: There is no distension.     Palpations: Abdomen is soft. There is no mass.     Tenderness: There is no abdominal tenderness. There is no guarding or rebound.  Genitourinary:      Comments: Stage 4 ulcer. No obvious purulence noted on my exam. No bleeding. No surrounding erythema or warmth. Musculoskeletal:        General: Normal range of motion.     Cervical back: Normal range of motion and neck supple.  Skin:    General: Skin is warm and dry.     Capillary Refill: Capillary refill takes less than 2 seconds.  Neurological:     Mental Status: He is alert and oriented  to person, place, and time.  Psychiatric:        Mood and Affect: Mood and affect normal.        Speech: Speech normal.        Behavior: Behavior normal.    ED Results / Procedures / Treatments   Labs (all labs ordered are listed, but only abnormal results are displayed) Labs Reviewed  CBC WITH DIFFERENTIAL/PLATELET - Abnormal; Notable for the following components:      Result Value   Hemoglobin 12.4 (*)    HCT 38.1 (*)    Lymphs Abs 0.5 (*)    All other components within normal limits    EKG None  Radiology DG Pelvis Portable  Result Date: 03/09/2021 CLINICAL DATA:  Right-sided pressure ulcer EXAM: PORTABLE PELVIS 1-2 VIEWS COMPARISON:  06/15/2008, 01/16/2020 FINDINGS: Supine frontal view of the pelvis was performed. Large soft tissue defect is seen in the right gluteal region, with  chronic erosive changes of the underlying right ischium and inferior ramus. No new bony destruction. No periosteal reaction. Bilateral hip osteoarthritis, right greater than left. Postsurgical changes bilateral femurs. IMPRESSION: 1. Chronic bony erosion of the right ischium and inferior ramus with overlying soft tissue ulceration, compatible with chronic osteomyelitis. No change since prior exam. Electronically Signed   By: Randa Ngo M.D.   On: 03/09/2021 20:14    Procedures Procedures    Medications Ordered in ED Medications  acetaminophen (TYLENOL) tablet 1,000 mg (1,000 mg Oral Given 03/09/21 1846)  fentaNYL (SUBLIMAZE) injection 50 mcg (50 mcg Intravenous Given 03/09/21 1847)  fentaNYL (SUBLIMAZE) injection 100 mcg (100 mcg Intravenous Given 03/09/21 1937)  amoxicillin-clavulanate (AUGMENTIN) 875-125 MG per tablet 1 tablet (1 tablet Oral Given 03/09/21 2057)  doxycycline (VIBRA-TABS) tablet 100 mg (100 mg Oral Given 03/09/21 2057)  oxyCODONE-acetaminophen (PERCOCET/ROXICET) 5-325 MG per tablet 1 tablet (1 tablet Oral Given 03/09/21 2057)    ED Course/ Medical Decision Making/ A&P                           Medical Decision Making  Presenting for evaluation of painful sacral ulcer.  On exam, patient appears nontoxic.  He is tachycardic, but this is most likely due to pain.  No leukocytosis, vitals are reassuring doubt sepsis.  Exam is overall reassuring, there is no obvious purulence, erythema, or warmth.  However in the setting of severe worsening pain on the wound that has been there for a long time, consider infection.  Will treat with antibiotics.  Will obtain x-ray to ensure no new bony abnormalities.  X-ray viewed and independently interpreted by me, no fracture, foreign body, or new bony destruction.  Discussed findings with patient.  Discussed importance of taking antibiotics, and close follow-up.  He has follow-up with wound care clinic in 4 days.  Patient is already on pain medication,  we will have him continue to take this at home.  Heart rate improved with pain control as expected.  At this time, patient appears safe for discharge.  Return precautions given.  Patient states he understands and agrees to plan.  Final Clinical Impression(s) / ED Diagnoses Final diagnoses:  Pressure injury of sacral region, stage 4 ([place])    Rx / DC Orders ED Discharge Orders          Ordered    amoxicillin-clavulanate (AUGMENTIN) 875-125 MG tablet  Every 12 hours        03/09/21 2046    doxycycline (VIBRAMYCIN) 100 MG  capsule  2 times daily        03/09/21 2046              Franchot Heidelberg, PA-C 03/09/21 2136    Truddie Hidden, MD 03/09/21 2146

## 2021-03-09 NOTE — ED Triage Notes (Signed)
Pressure ulcer for the past 10 years, states it has become painful today

## 2021-03-13 ENCOUNTER — Ambulatory Visit: Payer: Medicare Other | Admitting: Physician Assistant

## 2021-03-18 ENCOUNTER — Other Ambulatory Visit: Payer: Self-pay

## 2021-03-18 ENCOUNTER — Encounter: Payer: Medicare Other | Attending: Internal Medicine | Admitting: Internal Medicine

## 2021-03-18 DIAGNOSIS — M869 Osteomyelitis, unspecified: Secondary | ICD-10-CM | POA: Diagnosis not present

## 2021-03-18 DIAGNOSIS — L89314 Pressure ulcer of right buttock, stage 4: Secondary | ICD-10-CM | POA: Insufficient documentation

## 2021-03-18 DIAGNOSIS — G8220 Paraplegia, unspecified: Secondary | ICD-10-CM | POA: Diagnosis not present

## 2021-03-18 DIAGNOSIS — Z87891 Personal history of nicotine dependence: Secondary | ICD-10-CM | POA: Insufficient documentation

## 2021-03-19 NOTE — Progress Notes (Addendum)
[place], Remijio ([id number]) Visit Report for 03/18/2021 Allergy List Details Patient Name: [name], [name] Date of Service: 03/18/2021 12:45 PM Medical Record Number: [id number] Patient Account Number: [id number] Date of Birth/Sex: 07-19-1960 (61 y.o. M) Treating RN: Levora Dredge Primary Care Sanjna Haskew: Norva Riffle Other Clinician: Referring Bradlee Bridgers: Norva Riffle Treating Kohana Amble/Extender: Yaakov Guthrie in Treatment: 0 Allergies Active Allergies No Known Drug Allergies Allergy Notes Electronic Signature(s) Signed: 03/19/2021 12:48:18 PM By: Levora Dredge Entered By: Levora Dredge on 03/18/2021 12:42:13 Sol, Saber ([id number]) -------------------------------------------------------------------------------- Arrival Information Details Patient Name: [name] Date of Service: 03/18/2021 12:45 PM Medical Record Number: [id number] Patient Account Number: [id number] Date of Birth/Sex: 1960-05-10 (61 y.o. M) Treating RN: Levora Dredge Primary Care Zellie Jenning: Norva Riffle Other Clinician: Referring Kaesha Kirsch: Norva Riffle Treating Caylin Raby/Extender: Yaakov Guthrie in Treatment: 0 Visit Information Patient Arrived: Wheel Chair Arrival Time: 12:40 Accompanied By: self Transfer Assistance: Manual Patient Identification Verified: Yes Secondary Verification Process Completed: Yes Electronic Signature(s) Signed: 03/19/2021 12:48:18 PM By: Levora Dredge Entered By: Levora Dredge on 03/18/2021 12:41:02 [name], [name] ([id number]) -------------------------------------------------------------------------------- Clinic Level of Care Assessment Details Patient Name: [name] Date of Service: 03/18/2021 12:45 PM Medical Record Number: [id number] Patient Account Number: [id number] Date of Birth/Sex: 1960-08-02 (61 y.o. M) Treating RN: Levora Dredge Primary Care Cenia Zaragosa: Norva Riffle Other Clinician: Referring  Yvanna Vidas: Norva Riffle Treating Tranice Laduke/Extender: Yaakov Guthrie in Treatment: 0 Clinic Level of Care Assessment Items TOOL 2 Quantity Score X - Use when only an EandM is performed on the INITIAL visit 1 0 ASSESSMENTS - Nursing Assessment / Reassessment _0  - General Physical Exam (combine w/ comprehensive assessment (listed just below) when performed on new 0 pt. evals) _1  - 0 Comprehensive Assessment (HX, ROS, Risk Assessments, Wounds Hx, etc.) ASSESSMENTS - Wound and Skin Assessment / Reassessment X - Simple Wound Assessment / Reassessment - one wound 1 5 _2  - 0 Complex Wound Assessment / Reassessment - multiple wounds _3  - 0 Dermatologic / Skin Assessment (not related to wound area) ASSESSMENTS - Ostomy and/or Continence Assessment and Care _4  - Incontinence Assessment and Management 0 _5  - 0 Ostomy Care Assessment and Management (repouching, etc.) PROCESS - Coordination of Care X - Simple Patient / Family Education for ongoing care 1 15 _6  - 0 Complex (extensive) Patient / Family Education for ongoing care _7  - 0 Staff obtains Programmer, systems, Records, Test Results / Process Orders _8  - 0 Staff telephones HHA, Nursing Homes / Clarify orders / etc _9  - 0 Routine Transfer to another Facility (non-emergent condition) _10  - 0 Routine Hospital Admission (non-emergent condition) _11  - 0 New Admissions / Biomedical engineer / Ordering NPWT, Apligraf, etc. _12  - 0 Emergency Hospital Admission (emergent condition) X- 1 10 Simple Discharge Coordination _13  - 0 Complex (extensive) Discharge Coordination PROCESS - Special Needs _14  - Pediatric / Minor Patient Management 0 _15  - 0 Isolation Patient Management _16  - 0 Hearing / Language / Visual special needs _17  - 0 Assessment of Community assistance (transportation, D/C planning, etc.) _18  - 0 Additional assistance / Altered mentation _19  - 0 Support Surface(s) Assessment (bed, cushion, seat, etc.) INTERVENTIONS -  Wound Cleansing / Measurement X - Wound Imaging (photographs - any number of wounds) 1 5 _20  - 0 Wound Tracing (instead of photographs) X- 1 5 Simple Wound Measurement - one wound _21  - 0 Complex Wound Measurement - multiple wounds [name], [name] ([id number]) X- 1 5 Simple Wound Cleansing - one wound _22  - 0 Complex Wound Cleansing - multiple wounds INTERVENTIONS - Wound Dressings  X - Small Wound Dressing one or multiple wounds 1 10 _0  - 0 Medium Wound Dressing one or multiple wounds _1  - 0 Large Wound Dressing one or multiple wounds <[id number]>_9<\/[id number]>_6  - 0 Application of Medications - injection INTERVENTIONS - Miscellaneous _3  - External ear exam 0 _4  - 0 Specimen Collection (cultures, biopsies, blood, body fluids, etc.) _5  - 0 Specimen(s) / Culture(s) sent or taken to Lab for analysis _6  - 0 Patient Transfer (multiple staff / Harrel Lemon Lift / Similar devices) _7  - 0 Simple Staple / Suture removal (25 or less) _8  - 0 Complex Staple / Suture removal (26 or more) _9  - 0 Hypo / Hyperglycemic Management (close monitor of Blood Glucose) _10  - 0 Ankle / Brachial Index (ABI) - do not check if billed separately Has the patient been seen at the hospital within the last three years: Yes Total Score: 55 Level Of Care: New/Established - Level 2 Electronic Signature(s) Signed: 03/19/2021 12:48:18 PM By: Levora Dredge Entered By: Levora Dredge on 03/18/2021 13:49:40 [name], [name] ([id number]) -------------------------------------------------------------------------------- Encounter Discharge Information Details Patient Name: [name] Date of Service: 03/18/2021 12:45 PM Medical Record Number: [id number] Patient Account Number: [id number] Date of Birth/Sex: 1960/12/20 (61 y.o. M) Treating RN: Levora Dredge Primary Care Shaylen Nephew: Norva Riffle Other Clinician: Referring Fernado Brigante: Norva Riffle Treating Seferina Brokaw/Extender: Yaakov Guthrie in Treatment: 0 Encounter Discharge  Information Items Discharge Condition: Stable Ambulatory Status: Wheelchair Discharge Destination: Home Transportation: Private Auto Accompanied By: self Schedule Follow-up Appointment: Yes Clinical Summary of Care: Electronic Signature(s) Signed: 03/19/2021 12:48:18 PM By: Levora Dredge Entered By: Levora Dredge on 03/18/2021 13:51:14 [name], [name] ([id number]) -------------------------------------------------------------------------------- Lower Extremity Assessment Details Patient Name: [name] Date of Service: 03/18/2021 12:45 PM Medical Record Number: [id number] Patient Account Number: [id number] Date of Birth/Sex: 01/03/1961 (61 y.o. M) Treating RN: Levora Dredge Primary Care Kijuan Gallicchio: Norva Riffle Other Clinician: Referring Remmi Armenteros: Norva Riffle Treating Elisea Khader/Extender: Yaakov Guthrie in Treatment: 0 Notes pt paraplegic since 2005 Electronic Signature(s) Signed: 03/19/2021 12:48:18 PM By: Levora Dredge Entered By: Levora Dredge on 03/18/2021 12:55:23 [name], [name] ([id number]) -------------------------------------------------------------------------------- Multi Wound Chart Details Patient Name: [name] Date of Service: 03/18/2021 12:45 PM Medical Record Number: [id number] Patient Account Number: [id number] Date of Birth/Sex: 12-Feb-1961 (61 y.o. M) Treating RN: Levora Dredge Primary Care Amena Dockham: Norva Riffle Other Clinician: Referring Runette Scifres: Norva Riffle Treating Kindal Ponti/Extender: Yaakov Guthrie in Treatment: 0 Vital Signs Height(in): Pulse(bpm): 96 Weight(lbs): Blood Pressure(mmHg): 145/77 Body Mass Index(BMI): Temperature(F): 97.9 Respiratory Rate(breaths/min): 18 Photos: [N/A:N/A] Wound Location: Right Gluteus N/A N/A Wounding Event: Pressure Injury N/A N/A Primary Etiology: Pressure Ulcer N/A N/A Comorbid History: Anemia, Hepatitis C, History of N/A N/A pressure wounds,  Paraplegia Date Acquired: 03/08/2005 N/A N/A Weeks of Treatment: 0 N/A N/A Wound Status: Open N/A N/A Measurements L x W x D (cm) 3x1.5x2.7 N/A N/A Area (cm) : 3.534 N/A N/A Volume (cm) : 9.543 N/A N/A Position 1 (o'clock): 1 Maximum Distance 1 (cm): 3.5 Tunneling: Yes N/A N/A Classification: Category/Stage IV N/A N/A Exudate Amount: None Present N/A N/A Granulation Amount: Large (67-100%) N/A N/A Granulation Quality: Pink, Pale N/A N/A Epithelialization: None N/A N/A Treatment Notes Wound #1 (Gluteus) Wound Laterality: Right Cleanser [place]ram Ancillary Kit - 15 Day Supply Discharge Instruction: Use supplies as instructed; Kit contains: (15) Saline Bullets; (15) 3x3 Gauze; 15 pr Gloves Peri-Wound Care Topical Primary Dressing Gauze Discharge Instruction: As directed: dry, moistened with saline or moistened with Dakins Solution Secondary Dressing Zetuvit Plus Silicone Border Dressing 4x4 (in/in) Secured With [employer], Sayed (  [id number]) Compression Wrap Compression Stockings Add-Ons Electronic Signature(s) Signed: 03/18/2021 2:13:00 PM By: Kalman Shan DO Entered By: Kalman Shan on 03/18/2021 14:01:14 [name], [name] ([id number]) -------------------------------------------------------------------------------- Multi-Disciplinary Care Plan Details Patient Name: [name] Date of Service: 03/18/2021 12:45 PM Medical Record Number: [id number] Patient Account Number: [id number] Date of Birth/Sex: [birth year] (61 y.o. M) Treating RN: Levora Dredge Primary Care Ravonda Brecheen: Norva Riffle Other Clinician: Referring Charniece Venturino: Norva Riffle Treating Icker Swigert/Extender: Yaakov Guthrie in Treatment: 0 Active Inactive Electronic Signature(s) Signed: 03/19/2021 5:09:53 PM By: Gretta Cool, BSN, RN, CWS, Kim RN, BSN Signed: 03/20/2021 4:37:29 PM By: Levora Dredge Previous Signature: 03/19/2021 12:48:18 PM Version By: Levora Dredge Entered By: Gretta Cool BSN, RN, CWS,  Kim on 03/19/2021 17:09:53 [name], [name] ([id number]) -------------------------------------------------------------------------------- Pain Assessment Details Patient Name: [name] Date of Service: 03/18/2021 12:45 PM Medical Record Number: [id number] Patient Account Number: [id number] Date of Birth/Sex: [birth year] (61 y.o. M) Treating RN: Levora Dredge Primary Care Tiffanee Mcnee: Norva Riffle Other Clinician: Referring Javarius Tsosie: Norva Riffle Treating Angenette Daily/Extender: Yaakov Guthrie in Treatment: 0 Active Problems Location of Pain Severity and Description of Pain Patient Has Paino Yes Site Locations Rate the pain. Current Pain Level: 9 Pain Management and Medication Current Pain Management: Notes at wound site Electronic Signature(s) Signed: 03/19/2021 12:48:18 PM By: Levora Dredge Entered By: Levora Dredge on 03/18/2021 12:41:31 [name], [name] ([id number]) -------------------------------------------------------------------------------- Patient/Caregiver Education Details Patient Name: [name] Date of Service: 03/18/2021 12:45 PM Medical Record Number: [id number] Patient Account Number: [id number] Date of Birth/Gender: January 25, 1961 (61 y.o. M) Treating RN: Levora Dredge Primary Care Physician: Norva Riffle Other Clinician: Referring Physician: Norva Riffle Treating Physician/Extender: Yaakov Guthrie in Treatment: 0 Education Assessment Education Provided To: Patient Education Topics Provided Pain: Handouts: A Guide to Pain Control Methods: Explain/Verbal Responses: State content correctly Pressure: Handouts: Pressure Ulcers: Care and Offloading, Preventing Pressure Ulcers Methods: Explain/Verbal Responses: State content correctly Welcome To [facility]: Handouts: Welcome To [facility] Methods: Explain/Verbal Responses: State content correctly Wound/Skin Impairment: Handouts: Caring  for Your Ulcer Methods: Explain/Verbal Responses: State content correctly Electronic Signature(s) Signed: 03/19/2021 12:48:18 PM By: Levora Dredge Entered By: Levora Dredge on 03/18/2021 13:50:22 [name], [name] ([id number]) -------------------------------------------------------------------------------- Wound Assessment Details Patient Name: [name] Date of Service: 03/18/2021 12:45 PM Medical Record Number: [id number] Patient Account Number: [id number] Date of Birth/Sex: 1961-01-07 (61 y.o. M) Treating RN: Levora Dredge Primary Care Jorma Tassinari: Norva Riffle Other Clinician: Referring Montell Leopard: Norva Riffle Treating Merced Brougham/Extender: Yaakov Guthrie in Treatment: 0 Wound Status Wound Number: 1 Primary Pressure Ulcer Etiology: Wound Location: Right Gluteus Wound Status: Open Wounding Event: Pressure Injury Comorbid Anemia, Hepatitis C, History of pressure wounds, Date Acquired: 03/08/2005 History: Paraplegia Weeks Of Treatment: 0 Clustered Wound: No Photos Wound Measurements Length: (cm) 3 Width: (cm) 1.5 Depth: (cm) 2.7 Area: (cm) 3.534 Volume: (cm) 9.543 % Reduction in Area: % Reduction in Volume: Epithelialization: None Tunneling: Yes Position (o'clock): 1 Maximum Distance: (cm) 3.5 Undermining: No Wound Description Classification: Category/Stage IV Exudate Amount: None Present Foul Odor After Cleansing: No Slough/Fibrino No Wound Bed Granulation Amount: Large (67-100%) Granulation Quality: Pink, Pale Electronic Signature(s) Signed: 03/19/2021 12:48:18 PM By: Levora Dredge Entered By: Levora Dredge on 03/18/2021 13:25:19 [name], [name] ([id number]) -------------------------------------------------------------------------------- Vitals Details Patient Name: [name] Date of Service: 03/18/2021 12:45 PM Medical Record Number: [id number] Patient Account Number: [id number] Date of Birth/Sex: 05-03-1960 (61 y.o.  M) Treating RN: Levora Dredge Primary Care Mario Coronado: Norva Riffle Other Clinician: Referring Linette Gunderson: Norva Riffle Treating Lajuan Godbee/Extender: Yaakov Guthrie in Treatment: 0  Vital Signs Time Taken: 12:35 Temperature (F): 97.9 Pulse (bpm): 96 Respiratory Rate (breaths/min): 18 Blood Pressure (mmHg): 145/77 Reference Range: 80 - 120 mg / dl Electronic Signature(s) Signed: 03/19/2021 12:48:18 PM By: Levora Dredge Entered By: Levora Dredge on 03/18/2021 12:41:54

## 2021-03-19 NOTE — Progress Notes (Signed)
[name] ([id number]) Visit Report for 03/18/2021 Abuse/Suicide Risk Screen Details Patient Name: [name], [name] Date of Service: 03/18/2021 12:45 PM Medical Record Number: [id number] Patient Account Number: [id number] Date of Birth/Sex: [birth year] (61 y.o. M) Treating RN: Angelina Pih Primary Care Addilyne Backs: Vara Guardian Other Clinician: Referring Emigdio Wildeman: Vara Guardian Treating Bannie Lobban/Extender: Tilda Franco in Treatment: 0 Abuse/Suicide Risk Screen Items Answer ABUSE RISK SCREEN: Has anyone close to you tried to hurt or harm you recentlyo No Do you feel uncomfortable with anyone in your familyo No Has anyone forced you do things that you didnot want to doo No Electronic Signature(s) Signed: 03/19/2021 12:48:18 PM By: Angelina Pih Entered By: Angelina Pih on 03/18/2021 12:47:26 [place], Deano ([id number]) -------------------------------------------------------------------------------- Activities of Daily Living Details Patient Name: [name] Date of Service: 03/18/2021 12:45 PM Medical Record Number: [id number] Patient Account Number: [id number] Date of Birth/Sex: [birth year] (61 y.o. M) Treating RN: Angelina Pih Primary Care Onalee Steinbach: Vara Guardian Other Clinician: Referring Lavayah Vita: Vara Guardian Treating Abdulraheem Pineo/Extender: Tilda Franco in Treatment: 0 Activities of Daily Living Items Answer Activities of Daily Living (Please select one for each item) Drive Automobile Completely Able Take Medications Completely Able Use Telephone Completely Able Care for Appearance Completely Able Use Toilet Completely Able Bath / Shower Completely Able Dress Self Completely Able Feed Self Completely Able Walk Completely Able Get In / Out Bed Completely Able Housework Completely Able Prepare Meals Completely Able Handle Money Completely Able Shop for Self Completely Able Electronic Signature(s) Signed: 03/19/2021  12:48:18 PM By: Angelina Pih Entered By: Angelina Pih on 03/18/2021 12:47:58 Docter, Deontay ([id number]) -------------------------------------------------------------------------------- Education Screening Details Patient Name: [name] Date of Service: 03/18/2021 12:45 PM Medical Record Number: [id number] Patient Account Number: [id number] Date of Birth/Sex: May 13, 1960 (60 y.o. M) Treating RN: Angelina Pih Primary Care Jerry Haugen: Vara Guardian Other Clinician: Referring Carola Viramontes: Vara Guardian Treating Nathaniel Wakeley/Extender: Tilda Franco in Treatment: 0 Learning Preferences/Education Level/Primary Language Learning Preference: Explanation, Demonstration, Video, Communication Board, Printed Material Highest Education Level: Grade School Preferred Language: English Cognitive Barrier Language Barrier: No Translator Needed: No Memory Deficit: No Emotional Barrier: No Cultural/Religious Beliefs Affecting Medical Care: No Physical Barrier Impaired Vision: No Impaired Hearing: No Decreased Hand dexterity: No Knowledge/Comprehension Knowledge Level: High Comprehension Level: High Ability to understand written instructions: High Ability to understand verbal instructions: High Motivation Anxiety Level: Calm Cooperation: Cooperative Education Importance: Acknowledges Need Interest in Health Problems: Asks Questions Perception: Coherent Willingness to Engage in Self-Management High Activities: Readiness to Engage in Self-Management High Activities: Electronic Signature(s) Signed: 03/19/2021 12:48:18 PM By: Angelina Pih Entered By: Angelina Pih on 03/18/2021 12:48:37 Uehara, Ved ([id number]) -------------------------------------------------------------------------------- Fall Risk Assessment Details Patient Name: [name] Date of Service: 03/18/2021 12:45 PM Medical Record Number: [id number] Patient Account Number: [id number] Date of  Birth/Sex: [birth year] (61 y.o. M) Treating RN: Angelina Pih Primary Care Jametta Moorehead: Vara Guardian Other Clinician: Referring Elisheva Fallas: Vara Guardian Treating Jearl Soto/Extender: Tilda Franco in Treatment: 0 Fall Risk Assessment Items Have you had 2 or more falls in the last 12 monthso 0 No Have you had any fall that resulted in injury in the last 12 monthso 0 No FALLS RISK SCREEN History of falling - immediate or within 3 months 0 No Secondary diagnosis (Do you have 2 or more medical diagnoseso) 0 No Ambulatory aid None/bed rest/wheelchair/nurse 0 Yes Crutches/cane/walker 0 No Furniture 0 No Intravenous therapy Access/Saline/Heparin Lock 0 No Gait/Transferring Normal/ bed rest/ wheelchair 0 Yes Weak (short steps with or without shuffle, stooped but able to lift head while  walking, may 0 No seek support from furniture) Impaired (short steps with shuffle, may have difficulty arising from chair, head down, impaired 0 No balance) Mental Status Oriented to own ability 0 Yes Electronic Signature(s) Signed: 03/19/2021 12:48:18 PM By: Angelina Pih Entered By: Angelina Pih on 03/18/2021 12:49:13 Hutzler, Travonte ([id number]) -------------------------------------------------------------------------------- Foot Assessment Details Patient Name: [name] Date of Service: 03/18/2021 12:45 PM Medical Record Number: [id number] Patient Account Number: [id number] Date of Birth/Sex: 11/10/1960 (61 y.o. M) Treating RN: Angelina Pih Primary Care Maebry Obrien: Vara Guardian Other Clinician: Referring Zenna Traister: Vara Guardian Treating Sair Faulcon/Extender: Tilda Franco in Treatment: 0 Foot Assessment Items Site Locations + = Sensation present, - = Sensation absent, C = Callus, U = Ulcer R = Redness, W = Warmth, M = Maceration, PU = Pre-ulcerative lesion F = Fissure, S = Swelling, D = Dryness Assessment Right: Left: Other Deformity: No  No Prior Foot Ulcer: No No Prior Amputation: No No Charcot Joint: No No Ambulatory Status: Non-ambulatory Assistance Device: Wheelchair Gait: Steady Notes pt paraplegic, pt has no sensation to lower extremities from knee down on bilat legs Electronic Signature(s) Signed: 03/19/2021 12:48:18 PM By: Angelina Pih Entered By: Angelina Pih on 03/18/2021 12:51:22 Hedgecock, Konstantine ([id number]) -------------------------------------------------------------------------------- Nutrition Risk Screening Details Patient Name: [name] Date of Service: 03/18/2021 12:45 PM Medical Record Number: [id number] Patient Account Number: [id number] Date of Birth/Sex: April 22, 1960 (61 y.o. M) Treating RN: Angelina Pih Primary Care Kirstina Leinweber: Vara Guardian Other Clinician: Referring Yoselyn Mcglade: Vara Guardian Treating Jarelle Ates/Extender: Tilda Franco in Treatment: 0 Height (in): Weight (lbs): Body Mass Index (BMI): Nutrition Risk Screening Items Score Screening NUTRITION RISK SCREEN: I have an illness or condition that made me change the kind and/or amount of food I eat 0 No I eat fewer than two meals per day 0 No I eat few fruits and vegetables, or milk products 0 No I have three or more drinks of beer, liquor or wine almost every day 0 No I have tooth or mouth problems that make it hard for me to eat 0 No I don't always have enough money to buy the food I need 0 No I eat alone most of the time 0 No I take three or more different prescribed or over-the-counter drugs a day 0 No Without wanting to, I have lost or gained 10 pounds in the last six months 0 No I am not always physically able to shop, cook and/or feed myself 0 No Nutrition Protocols Good Risk Protocol 0 No interventions needed Moderate Risk Protocol High Risk Proctocol Risk Level: Good Risk Score: 0 Electronic Signature(s) Signed: 03/19/2021 12:48:18 PM By: Angelina Pih Entered By: Angelina Pih on  03/18/2021 12:49:35

## 2021-03-19 NOTE — Progress Notes (Signed)
[name] ([id number]) Visit Report for 03/18/2021 Chief Complaint Document Details Patient Name: [name], [name] Date of Service: 03/18/2021 12:45 PM Medical Record Number: [id number] Patient Account Number: [id number] Date of Birth/Sex: 23-Apr-1960 (61 y.o. M) Treating RN: Levora Dredge Primary Care Provider: Norva Riffle Other Clinician: Referring Provider: Norva Riffle Treating Provider/Extender: Yaakov Guthrie in Treatment: 0 Information Obtained from: Patient Chief Complaint Right buttocks ulcer since 2007 Electronic Signature(s) Signed: 03/18/2021 2:13:00 PM By: Kalman Shan DO Entered By: Kalman Shan on 03/18/2021 14:01:38 [place], [place] ([id number]) -------------------------------------------------------------------------------- HPI Details Patient Name: [name] Date of Service: 03/18/2021 12:45 PM Medical Record Number: [id number] Patient Account Number: [id number] Date of Birth/Sex: 1960/07/02 (61 y.o. M) Treating RN: Levora Dredge Primary Care Provider: Norva Riffle Other Clinician: Referring Provider: Norva Riffle Treating Provider/Extender: Yaakov Guthrie in Treatment: 0 History of Present Illness HPI Description: Admission 03/18/2021 Mr. 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 He is well-known to the [facility] wound care center. He has followed with them since 2013 for this issue. He has had excision of the right ischial pressure ulcer with bone debridement in the OR by Dr. Vernona Rieger in 2019. He has had a wound VAC in the past. He states he repositions every hour to try to offload the wound bed. He is scheduled to see Dr. Zigmund Daniel [facility] wound care center) later this month. He was hoping to  discuss a plastic surgery referral for potential muscle flap. He states he went to the ED earlier in the month and was prescribed doxycycline. He currently uses disposable underwear and changes this 5 times daily. He is not using a dressing to the wound bed. He currently denies signs of infection. Electronic Signature(s) Signed: 03/18/2021 2:13:00 PM By: Kalman Shan DO Entered By: Kalman Shan on 03/18/2021 14:05:12 [place], [place]im ([id number]) -------------------------------------------------------------------------------- Physical Exam Details Patient Name: [name] Date of Service: 03/18/2021 12:45 PM Medical Record Number: [id number] Patient Account Number: [id number] Date of Birth/Sex: [birth year] (61 y.o. M) Treating RN: Levora Dredge Primary Care Provider: Norva Riffle Other Clinician: Referring Provider: Norva Riffle Treating Provider/Extender: Yaakov Guthrie in Treatment: 0 Constitutional . Psychiatric . Notes Right buttocks ulcer with tunneling. Opening area appears macerated. This tunnels. No obvious signs of surrounding infection. No odor noted. Electronic Signature(s) Signed: 03/18/2021 2:13:00 PM By: Kalman Shan DO Entered By: Kalman Shan on 03/18/2021 14:06:08 Medaglia, Serafino ([id number]) -------------------------------------------------------------------------------- Physician Orders Details Patient Name: [name] Date of Service: 03/18/2021 12:45 PM Medical Record Number: [id number] Patient Account Number: [id number] Date of Birth/Sex: [birth year] (61 y.o. M) Treating RN: Levora Dredge Primary Care Provider: Norva Riffle Other Clinician: Referring Provider: Norva Riffle Treating Provider/Extender: Yaakov Guthrie in Treatment: 0 Verbal / Phone Orders: No Diagnosis Coding Follow-up Appointments o Return Appointment in: - Patient will call office if he needs to come back. Will follow with  [facility] and call us on a as needed basis. Referral sent to plastic surgery MD Luppens. Bathing/ Shower/ Hygiene o Wash wounds with antibacterial soap and water. o May shower; gently cleanse wound with antibacterial soap, rinse and pat dry prior to dressing wounds o No tub bath. Wound Treatment Wound #1 - Gluteus Wound Laterality: Right Cleanser: Byram Ancillary Kit - 15 Day Supply (DME) (Generic) 1 x Per Day/30 Days Discharge Instructions: Use supplies as instructed; Kit  contains: (15) Saline Bullets; (15) 3x3 Gauze; 15 pr Gloves Primary Dressing: Gauze (DME) (Generic) 1 x Per Day/30 Days Discharge Instructions: As directed: dry, moistened with saline or moistened with Dakins Solution Secondary Dressing: Zetuvit Plus Silicone Border Dressing 4x4 (in/in) (DME) (Generic) 1 x Per Day/30 Days Consults o Plastic Surgery - MD Lupins Patient Medications Allergies: No Known Drug Allergies Notifications Medication Indication Start End Dakin's Solution 03/18/2021 DOSE 1 - miscellaneous 0.125 % solution - use for wet to dry dressings Electronic Signature(s) Signed: 03/18/2021 2:11:03 PM By: Kalman Shan DO Entered By: Kalman Shan on 03/18/2021 14:11:03 [place], [place] ([id number]) -------------------------------------------------------------------------------- Problem List Details Patient Name: [name] Date of Service: 03/18/2021 12:45 PM Medical Record Number: [id number] Patient Account Number: [id number] Date of Birth/Sex: 1960/12/23 (61 y.o. M) Treating RN: Levora Dredge Primary Care Provider: Norva Riffle Other Clinician: Referring Provider: Norva Riffle Treating Provider/Extender: Yaakov Guthrie in Treatment: 0 Active Problems ICD-10 Encounter Code Description Active Date MDM Diagnosis L89.314 Pressure ulcer of right buttock, stage 4 03/18/2021 No Yes G82.20 Paraplegia, unspecified 03/18/2021 No Yes Inactive Problems Resolved  Problems Electronic Signature(s) Signed: 03/18/2021 2:13:00 PM By: Kalman Shan DO Entered By: Kalman Shan on 03/18/2021 14:01:03 [place], [place] ([id number]) -------------------------------------------------------------------------------- Progress Note Details Patient Name: [name] Date of Service: 03/18/2021 12:45 PM Medical Record Number: [id number] Patient Account Number: [id number] Date of Birth/Sex: 1960-10-27 (61 y.o. M) Treating RN: Levora Dredge Primary Care Provider: Norva Riffle Other Clinician: Referring Provider: Norva Riffle Treating Provider/Extender: Yaakov Guthrie in Treatment: 0 Subjective Chief Complaint Information obtained from Patient Right buttocks ulcer since 2007 History of Present Illness (HPI) Admission 03/18/2021 Mr. 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 He is well-known to the [facility] wound care center. He has followed with them since 2013 for this issue. He has had excision of the right ischial pressure ulcer with bone debridement in the OR by Dr. Vernona Rieger in 2019. He has had a wound VAC in the past. He states he repositions every hour to try to offload the wound bed. He is scheduled to see Dr. Zigmund [facility] wound care center) later this month. He was hoping to discuss a plastic surgery referral for potential muscle flap. He states he went to the ED earlier in the month and was prescribed doxycycline. He currently uses disposable underwear and changes this 5 times daily. He is not using a dressing to the wound bed. He currently denies signs of infection. Patient History Allergies No Known Drug Allergies Social History Former smoker - ended on 08/10/2010, Alcohol Use - Never - in past, Drug Use - Prior  History, Caffeine Use - Daily - coffee. 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 Eyes Denies complaints or symptoms of Dry Eyes, Vision Changes, Glasses / Contacts. Ear/Nose/Mouth/Throat Denies complaints or symptoms of Difficult clearing ears, Sinusitis. Respiratory Denies complaints or symptoms of Chronic or frequent coughs, Shortness of Breath. Cardiovascular  Denies complaints or symptoms of Chest pain, LE edema. Endocrine Denies complaints or symptoms of Hepatitis, Thyroid disease, Polydypsia (Excessive Thirst). Genitourinary Denies complaints or symptoms of Kidney failure/ Dialysis, Incontinence/dribbling. Immunological Denies complaints or symptoms of Hives, Itching. Neurologic Denies complaints or symptoms of Numbness/parasthesias, Focal/Weakness. Psychiatric Complains or has symptoms of Anxiety. Ehrsam, Inri ([id number]) Objective Constitutional Vitals Time Taken: 12:35 PM, Temperature: 97.9 F, Pulse: 96 bpm, Respiratory Rate: 18 breaths/min, Blood Pressure: 145/77 mmHg. General Notes: Right buttocks ulcer with tunneling. Opening area appears macerated. This tunnels. No obvious signs of surrounding infection. No odor noted. Integumentary (Hair, Skin) Wound #1 status is Open. Original cause of wound was Pressure Injury. The date acquired was: 03/08/2005. The wound is located on the Right Gluteus. The wound measures 3cm length x 1.5cm width x 2.7cm depth; 3.534cm^2  area and 9.543cm^3 volume. There is no undermining noted, however, there is tunneling at 1:00 with a maximum distance of 3.5cm. There is a none present amount of drainage noted. There is large (67-100%) pink, pale granulation within the wound bed. 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 He follows with [facility] wound care and it appears that the wound was closed in June 2022. This has since reopened. I recommended aggressive offloading and Dakin's wet-to-dry dressing with foam border dressing to be changed daily. He would like a muscle flap and I recommended referral to [facility] however he states that is too far of a drive. We will send a referral to [facility]. He is following up with [facility] wound care center at the end of the month. He states he would like to continue care there on a regular basis. He can follow-up here as needed. He knows to call with any questions or concerns. 46 minutes was spent on the encounter including face-to-face, EMR review and coordination of care Plan Follow-up Appointments: Return Appointment in: - Patient will call office if he needs to come back. Will follow with [facility] and call us on a as needed basis. Referral sent to plastic surgery MD Luppens. Bathing/ Shower/ Hygiene: Wash wounds with antibacterial soap and water. May shower; gently cleanse wound with antibacterial soap, rinse and pat dry prior to dressing wounds No tub bath. Consults ordered were: Plastic Surgery - MD Lupins The following medication(s) was prescribed: Dakin's Solution miscellaneous 0.125 % solution 1 use for wet to dry dressings starting 03/18/2021 WOUND #1: - Gluteus Wound Laterality: Right Cleanser: Byram Ancillary Kit - 15 Day Supply (DME) (Generic) 1 x Per Day/30 Days Discharge Instructions: Use  supplies as instructed; Kit contains: (15) Saline Bullets; (15) 3x3 Gauze; 15 pr Gloves Primary Dressing: Gauze (DME) (Generic) 1 x Per Day/30 Days Discharge Instructions: As directed: dry, moistened with saline or moistened with Dakins Solution Secondary Dressing: Zetuvit Plus Silicone Border Dressing 4x4 (in/in) (DME) (Generic) 1 x Per Day/30 Days Darrington, Betty ([id number]) 1. Dakin's wet-to-dry 2. Plastic surgery referral 3. Follow-up as needed Electronic Signature(s) Signed: 03/18/2021 2:13:00 PM By: Kalman Shan DO Entered By: Kalman Shan on 03/18/2021 14:11:40 Saar, Joselito ([id number]) -------------------------------------------------------------------------------- ROS/PFSH Details Patient Name: [name] Date of Service: 03/18/2021 12:45 PM Medical Record Number: [id number] Patient Account Number: [id number] Date of Birth/Sex: [birth year] (61 y.o. M) Treating RN: Levora Dredge Primary Care Provider: Norva Riffle Other Clinician: Referring Provider: Norva Riffle Treating Provider/Extender: Yaakov Guthrie in Treatment: 0 Constitutional Symptoms (General  Health) Complaints and Symptoms: Negative for: Fatigue; Fever; Chills; Marked Weight Change Eyes Complaints and Symptoms: Negative for: Dry Eyes; Vision Changes; Glasses / Contacts Ear/Nose/Mouth/Throat Complaints and Symptoms: Negative for: Difficult clearing ears; Sinusitis Respiratory Complaints and Symptoms: Negative for: Chronic or frequent coughs; Shortness of Breath Cardiovascular Complaints and Symptoms: Negative for: Chest pain; LE edema Endocrine Complaints and Symptoms: Negative for: Hepatitis; Thyroid disease; Polydypsia (Excessive Thirst) Genitourinary Complaints and Symptoms: Negative for: Kidney failure/ Dialysis; Incontinence/dribbling Immunological Complaints and Symptoms: Negative for: Hives; Itching Neurologic Complaints and Symptoms: Negative for:  Numbness/parasthesias; Focal/Weakness Medical History: Positive for: Paraplegia Psychiatric Complaints and Symptoms: Positive for: Anxiety Hematologic/Lymphatic Medical History: Positive for: Anemia [place], Monique ([id number]) Gastrointestinal Medical History: Positive for: Hepatitis C Past Medical History Notes: pt states hep c cured Integumentary (Skin) Medical History: Positive for: History of pressure wounds Past Medical History Notes: pt states has had wound for 10 years Musculoskeletal Medical History: Past Medical History Notes: arthritis in hands Oncologic Immunizations Pneumococcal Vaccine: Received Pneumococcal Vaccination: Yes Received Pneumococcal Vaccination On or After 60th Birthday: No Implantable Devices None Family and Social History Former smoker - ended on 08/10/2010; Alcohol Use: Never - in past; Drug Use: Prior History; Caffeine Use: Daily - coffee Electronic Signature(s) Signed: 03/18/2021 2:13:00 PM By: Kalman Shan DO Signed: 03/19/2021 12:48:18 PM By: Levora Dredge Entered By: Levora Dredge on 03/18/2021 12:47:16 [place], [place] ([id number]) -------------------------------------------------------------------------------- SuperBill Details Patient Name: [name] Date of Service: 03/18/2021 Medical Record Number: [id number] Patient Account Number: [id number] Date of Birth/Sex: August 26, 1960 (61 y.o. M) Treating RN: Levora Dredge Primary Care Provider: Norva Riffle Other Clinician: Referring Provider: Norva Riffle Treating Provider/Extender: Yaakov Guthrie in Treatment: 0 Diagnosis Coding ICD-10 Codes Code Description L89.314 Pressure ulcer of right buttock, stage 4 G82.20 Paraplegia, unspecified Facility Procedures CPT4 Code: [id number] Description: [phone] - WOUND CARE VISIT-LEV 2 EST PT Modifier: Quantity: 1 Physician Procedures CPT4 Code: 1423200 Description: 94179 - WC PHYS LEVEL 4 - NEW  PT Modifier: Quantity: 1 CPT4 Code: Description: ICD-10 Diagnosis Description L89.314 Pressure ulcer of right buttock, stage 4 G82.20 Paraplegia, unspecified Modifier: Quantity: Electronic Signature(s) Signed: 03/18/2021 2:13:00 PM By: Kalman Shan DO Entered By: Kalman Shan on 03/18/2021 14:12:34

## 2021-06-26 ENCOUNTER — Encounter (HOSPITAL_COMMUNITY): Payer: Self-pay | Admitting: *Deleted

## 2021-06-26 ENCOUNTER — Emergency Department (HOSPITAL_COMMUNITY)
Admission: EM | Admit: 2021-06-26 | Discharge: 2021-06-26 | Discharge status: Against Medical Advice | Payer: Medicare Other | Source: Home / Self Care

## 2021-06-26 ENCOUNTER — Other Ambulatory Visit: Payer: Self-pay

## 2021-06-26 DIAGNOSIS — K56609 Unspecified intestinal obstruction, unspecified as to partial versus complete obstruction: Secondary | ICD-10-CM | POA: Diagnosis not present

## 2021-06-26 DIAGNOSIS — Z5321 Procedure and treatment not carried out due to patient leaving prior to being seen by health care provider: Secondary | ICD-10-CM | POA: Insufficient documentation

## 2021-06-26 DIAGNOSIS — R112 Nausea with vomiting, unspecified: Secondary | ICD-10-CM | POA: Insufficient documentation

## 2021-06-26 DIAGNOSIS — R197 Diarrhea, unspecified: Secondary | ICD-10-CM | POA: Insufficient documentation

## 2021-06-26 DIAGNOSIS — K5651 Intestinal adhesions [bands], with partial obstruction: Secondary | ICD-10-CM | POA: Diagnosis not present

## 2021-06-26 NOTE — ED Triage Notes (Signed)
Pt in c/o n/v/d,  onset x 2 days, pt states, "I ate some bad meat I think", pt reports x 3 emesis in the last 24 hours,  denies liquid in the last 24 hours, pt A&O x4 ?

## 2021-06-26 NOTE — ED Notes (Signed)
Attempted to round on the pt in the lobby, pt did not answer when his name was called, will attempt to f/u again ?

## 2021-06-27 ENCOUNTER — Inpatient Hospital Stay (HOSPITAL_COMMUNITY)
Admission: EM | Admit: 2021-06-27 | Discharge: 2021-07-08 | DRG: 329 | Disposition: A | Discharge status: Home / Self Care | Payer: Medicare Other | Attending: Family Medicine | Admitting: Family Medicine

## 2021-06-27 ENCOUNTER — Other Ambulatory Visit: Payer: Self-pay

## 2021-06-27 ENCOUNTER — Encounter (HOSPITAL_COMMUNITY): Payer: Self-pay

## 2021-06-27 ENCOUNTER — Inpatient Hospital Stay (HOSPITAL_COMMUNITY): Payer: Medicare Other

## 2021-06-27 ENCOUNTER — Emergency Department (HOSPITAL_COMMUNITY): Payer: Medicare Other

## 2021-06-27 DIAGNOSIS — K56609 Unspecified intestinal obstruction, unspecified as to partial versus complete obstruction: Secondary | ICD-10-CM | POA: Diagnosis present

## 2021-06-27 DIAGNOSIS — Z981 Arthrodesis status: Secondary | ICD-10-CM | POA: Diagnosis not present

## 2021-06-27 DIAGNOSIS — G894 Chronic pain syndrome: Secondary | ICD-10-CM | POA: Diagnosis present

## 2021-06-27 DIAGNOSIS — F32A Depression, unspecified: Secondary | ICD-10-CM | POA: Diagnosis present

## 2021-06-27 DIAGNOSIS — Z8041 Family history of malignant neoplasm of ovary: Secondary | ICD-10-CM | POA: Diagnosis not present

## 2021-06-27 DIAGNOSIS — Z8249 Family history of ischemic heart disease and other diseases of the circulatory system: Secondary | ICD-10-CM

## 2021-06-27 DIAGNOSIS — Z9049 Acquired absence of other specified parts of digestive tract: Secondary | ICD-10-CM | POA: Diagnosis not present

## 2021-06-27 DIAGNOSIS — D649 Anemia, unspecified: Secondary | ICD-10-CM | POA: Diagnosis not present

## 2021-06-27 DIAGNOSIS — Z993 Dependence on wheelchair: Secondary | ICD-10-CM | POA: Diagnosis not present

## 2021-06-27 DIAGNOSIS — L89314 Pressure ulcer of right buttock, stage 4: Secondary | ICD-10-CM | POA: Diagnosis present

## 2021-06-27 DIAGNOSIS — G8220 Paraplegia, unspecified: Secondary | ICD-10-CM | POA: Diagnosis present

## 2021-06-27 DIAGNOSIS — M86651 Other chronic osteomyelitis, right thigh: Secondary | ICD-10-CM | POA: Diagnosis not present

## 2021-06-27 DIAGNOSIS — F418 Other specified anxiety disorders: Secondary | ICD-10-CM | POA: Diagnosis not present

## 2021-06-27 DIAGNOSIS — Z79899 Other long term (current) drug therapy: Secondary | ICD-10-CM | POA: Diagnosis not present

## 2021-06-27 DIAGNOSIS — G8929 Other chronic pain: Secondary | ICD-10-CM | POA: Diagnosis present

## 2021-06-27 DIAGNOSIS — M8668 Other chronic osteomyelitis, other site: Secondary | ICD-10-CM | POA: Diagnosis present

## 2021-06-27 DIAGNOSIS — K838 Other specified diseases of biliary tract: Secondary | ICD-10-CM

## 2021-06-27 DIAGNOSIS — K56600 Partial intestinal obstruction, unspecified as to cause: Secondary | ICD-10-CM | POA: Diagnosis not present

## 2021-06-27 DIAGNOSIS — Z8614 Personal history of Methicillin resistant Staphylococcus aureus infection: Secondary | ICD-10-CM | POA: Diagnosis not present

## 2021-06-27 DIAGNOSIS — Z8619 Personal history of other infectious and parasitic diseases: Secondary | ICD-10-CM | POA: Diagnosis not present

## 2021-06-27 DIAGNOSIS — M1990 Unspecified osteoarthritis, unspecified site: Secondary | ICD-10-CM | POA: Diagnosis present

## 2021-06-27 DIAGNOSIS — Z87891 Personal history of nicotine dependence: Secondary | ICD-10-CM | POA: Diagnosis not present

## 2021-06-27 DIAGNOSIS — B961 Klebsiella pneumoniae [K. pneumoniae] as the cause of diseases classified elsewhere: Secondary | ICD-10-CM | POA: Diagnosis present

## 2021-06-27 DIAGNOSIS — F1120 Opioid dependence, uncomplicated: Secondary | ICD-10-CM | POA: Diagnosis present

## 2021-06-27 DIAGNOSIS — E162 Hypoglycemia, unspecified: Secondary | ICD-10-CM | POA: Diagnosis present

## 2021-06-27 DIAGNOSIS — E876 Hypokalemia: Secondary | ICD-10-CM | POA: Diagnosis present

## 2021-06-27 DIAGNOSIS — Z807 Family history of other malignant neoplasms of lymphoid, hematopoietic and related tissues: Secondary | ICD-10-CM

## 2021-06-27 DIAGNOSIS — E871 Hypo-osmolality and hyponatremia: Secondary | ICD-10-CM | POA: Diagnosis present

## 2021-06-27 DIAGNOSIS — F419 Anxiety disorder, unspecified: Secondary | ICD-10-CM | POA: Diagnosis present

## 2021-06-27 DIAGNOSIS — N390 Urinary tract infection, site not specified: Secondary | ICD-10-CM | POA: Diagnosis present

## 2021-06-27 DIAGNOSIS — K5651 Intestinal adhesions [bands], with partial obstruction: Principal | ICD-10-CM | POA: Diagnosis present

## 2021-06-27 DIAGNOSIS — K219 Gastro-esophageal reflux disease without esophagitis: Secondary | ICD-10-CM | POA: Diagnosis present

## 2021-06-27 DIAGNOSIS — L89150 Pressure ulcer of sacral region, unstageable: Secondary | ICD-10-CM

## 2021-06-27 LAB — COMPREHENSIVE METABOLIC PANEL
ALT: 11 U/L (ref 0–44)
AST: 17 U/L (ref 15–41)
Albumin: 3.9 g/dL (ref 3.5–5.0)
Alkaline Phosphatase: 79 U/L (ref 38–126)
Anion gap: 11 (ref 5–15)
BUN: 14 mg/dL (ref 6–20)
CO2: 29 mmol/L (ref 22–32)
Calcium: 9.4 mg/dL (ref 8.9–10.3)
Chloride: 93 mmol/L — ABNORMAL LOW (ref 98–111)
Creatinine, Ser: 0.65 mg/dL (ref 0.61–1.24)
GFR, Estimated: >60 mL/min (ref >60)
Glucose, Bld: 92 mg/dL (ref 70–99)
Potassium: 3.8 mmol/L (ref 3.5–5.1)
Sodium: 133 mmol/L — ABNORMAL LOW (ref 135–145)
Total Bilirubin: 0.4 mg/dL (ref 0.3–1.2)
Total Protein: 9.1 g/dL — ABNORMAL HIGH (ref 6.5–8.1)

## 2021-06-27 LAB — URINALYSIS, ROUTINE W REFLEX MICROSCOPIC
Bilirubin Urine: NEGATIVE
Glucose, UA: NEGATIVE mg/dL
Ketones, ur: 80 mg/dL — AB
Nitrite: NEGATIVE
Protein, ur: NEGATIVE mg/dL
Specific Gravity, Urine: 1.019 (ref 1.005–1.030)
pH: 6 (ref 5.0–8.0)

## 2021-06-27 LAB — CBC WITH DIFFERENTIAL/PLATELET
Abs Immature Granulocytes: 0.03 10*3/uL (ref 0.00–0.07)
Basophils Absolute: 0 10*3/uL (ref 0.0–0.1)
Basophils Relative: 0 %
Eosinophils Absolute: 0 10*3/uL (ref 0.0–0.5)
Eosinophils Relative: 0 %
HCT: 43 % (ref 39.0–52.0)
Hemoglobin: 13.9 g/dL (ref 13.0–17.0)
Immature Granulocytes: 0 %
Lymphocytes Relative: 6 %
Lymphs Abs: 0.7 10*3/uL (ref 0.7–4.0)
MCH: 25.6 pg — ABNORMAL LOW (ref 26.0–34.0)
MCHC: 32.3 g/dL (ref 30.0–36.0)
MCV: 79.3 fL — ABNORMAL LOW (ref 80.0–100.0)
Monocytes Absolute: 0.9 10*3/uL (ref 0.1–1.0)
Monocytes Relative: 7 %
Neutro Abs: 11.4 10*3/uL — ABNORMAL HIGH (ref 1.7–7.7)
Neutrophils Relative %: 87 %
Platelets: 361 10*3/uL (ref 150–400)
RBC: 5.42 MIL/uL (ref 4.22–5.81)
RDW: 13.9 % (ref 11.5–15.5)
WBC: 13.1 10*3/uL — ABNORMAL HIGH (ref 4.0–10.5)
nRBC: 0 % (ref 0.0–0.2)

## 2021-06-27 LAB — LIPASE, BLOOD: Lipase: 18 U/L (ref 11–51)

## 2021-06-27 LAB — SEDIMENTATION RATE: Sed Rate: 47 mm/hr — ABNORMAL HIGH (ref 0–16)

## 2021-06-27 LAB — LACTIC ACID, PLASMA
Lactic Acid, Venous: 1.2 mmol/L (ref 0.5–1.9)
Lactic Acid, Venous: 1.8 mmol/L (ref 0.5–1.9)

## 2021-06-27 MED ORDER — SODIUM CHLORIDE 0.9 % IV SOLN: INTRAVENOUS | Status: DC

## 2021-06-27 MED ORDER — IOHEXOL 300 MG/ML  SOLN
100.0000 mL | Freq: Once | INTRAMUSCULAR | Status: AC | PRN
  Administered 2021-06-27: 100 mL via INTRAVENOUS

## 2021-06-27 MED ORDER — ONDANSETRON HCL 4 MG PO TABS
4.0000 mg | ORAL_TABLET | Freq: Four times a day (QID) | ORAL | Status: DC | PRN
  Administered 2021-07-06: 4 mg via ORAL
  Filled 2021-06-27: qty 1

## 2021-06-27 MED ORDER — SODIUM CHLORIDE 0.9 % IV BOLUS
1000.0000 mL | Freq: Once | INTRAVENOUS | Status: AC
  Administered 2021-06-27: 1000 mL via INTRAVENOUS

## 2021-06-27 MED ORDER — METHADONE HCL 10 MG/ML IJ SOLN: 10.0000 mg | Freq: Three times a day (TID) | INTRAMUSCULAR | Status: DC

## 2021-06-27 MED ORDER — LACTATED RINGERS IV SOLN: INTRAVENOUS | Status: DC

## 2021-06-27 MED ORDER — PHENOL 1.4 % MT LIQD
1.0000 | OROMUCOSAL | Status: DC | PRN
Start: 2021-06-27 — End: 2021-07-08
  Filled 2021-06-27: qty 177

## 2021-06-27 MED ORDER — ENOXAPARIN SODIUM 40 MG/0.4ML IJ SOSY
40.0000 mg | PREFILLED_SYRINGE | INTRAMUSCULAR | Status: DC
  Administered 2021-06-27 – 2021-06-30 (×4): 40 mg via SUBCUTANEOUS
  Filled 2021-06-27 (×4): qty 0.4

## 2021-06-27 MED ORDER — METHADONE HCL 10 MG PO TABS
20.0000 mg | ORAL_TABLET | Freq: Once | ORAL | Status: AC
  Administered 2021-06-27: 20 mg via ORAL
  Filled 2021-06-27 (×2): qty 2

## 2021-06-27 MED ORDER — KETOROLAC TROMETHAMINE 15 MG/ML IJ SOLN
15.0000 mg | Freq: Four times a day (QID) | INTRAMUSCULAR | Status: DC | PRN
  Administered 2021-06-27 – 2021-07-02 (×3): 15 mg via INTRAVENOUS
  Filled 2021-06-27 (×3): qty 1

## 2021-06-27 MED ORDER — DOCUSATE SODIUM 100 MG PO CAPS
100.0000 mg | ORAL_CAPSULE | Freq: Two times a day (BID) | ORAL | Status: DC
  Filled 2021-06-27 (×3): qty 1

## 2021-06-27 MED ORDER — LINACLOTIDE 145 MCG PO CAPS
145.0000 ug | ORAL_CAPSULE | Freq: Every day | ORAL | Status: DC
  Filled 2021-06-27: qty 1

## 2021-06-27 MED ORDER — ONDANSETRON HCL 4 MG/2ML IJ SOLN
4.0000 mg | Freq: Once | INTRAMUSCULAR | Status: AC
  Administered 2021-06-27: 4 mg via INTRAVENOUS
  Filled 2021-06-27: qty 2

## 2021-06-27 MED ORDER — HYDROMORPHONE HCL 1 MG/ML IJ SOLN
0.5000 mg | Freq: Once | INTRAMUSCULAR | Status: AC
  Administered 2021-06-27: 0.5 mg via INTRAVENOUS
  Filled 2021-06-27: qty 0.5

## 2021-06-27 MED ORDER — ONDANSETRON HCL 4 MG/2ML IJ SOLN
4.0000 mg | Freq: Four times a day (QID) | INTRAMUSCULAR | Status: DC | PRN
Start: 2021-06-27 — End: 2021-07-08
  Administered 2021-07-06 – 2021-07-07 (×5): 4 mg via INTRAVENOUS
  Filled 2021-06-27 (×6): qty 2

## 2021-06-27 NOTE — Progress Notes (Signed)
Called by Dr. Melina Copa regarding this patient.  He complains of abdominal pain, nausea, and vomiting for 48 hours.  He underwent workup in the ED which demonstrated a leukocytosis of 13.1.  His CT abdomen and pelvis demonstrates early or incomplete small bowel obstruction with transition point in the right lower abdomen, pneumobilia, and worsening right decubitus ulcer.  Per Dr. Orlena Sheldon, that patient has a soft abdominal exam. Recommend conservative management at this time. ? ?Plan: ?-Recommend admission to hospitalist service ?-NG tube placement  ?-NPO ?-IVF ?-Lactic acid ordered ?-Patient's pneumobilia is likely secondary to ERCP with sphincterotomy and patent sphincter of Oddi ?-I will plan to see the patient tomorrow.  Please call with any questions or concerns ? ?Riann Oman, DO ?[facility] ?[place]  [id number] ?[phone] (office) ? ?

## 2021-06-27 NOTE — ED Notes (Signed)
Pt self caths at home and requested to self cath here for his sample.  ?
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
## 2021-06-27 NOTE — ED Triage Notes (Signed)
Patient reports abd pain since Thursday.  Patient reports LBM was Thursday.  Patient complains of nausea and vomiting a total of 8 times.   ?

## 2021-06-27 NOTE — ED Provider Notes (Signed)
Signout from Dr. Hyacinth Meeker.  61 year old male paraplegic here with 2 days of abdominal pain left-sided associated with nausea vomiting and decreased bowel frequency.  History of chronic constipation.  Labs unremarkable.  He is pending a urinalysis and a CAT scan.  Disposition per results of testing. ?Physical Exam  ?BP 127/84   Pulse 83   Temp 97.6 ?F (36.4 ?C) (Oral)   Resp (!) 22   Ht 5\' 9"  (1.753 m)   Wt 68 kg   SpO2 99%   BMI 22.15 kg/m?  ? ?Physical Exam ? ?Procedures  ?Procedures ? ?ED Course / MDM  ?  ?Medical Decision Making ?Amount and/or Complexity of Data Reviewed ?Labs: ordered. ?Radiology: ordered. ? ?Risk ?Prescription drug management. ?Decision regarding hospitalization. ? ? ?Reviewed history with patient.  He said he has had nausea vomiting for the last 3 days.  Associated with abdominal distention.  He also states he had a chronic wound of his right buttock that is been going on for over 15 years.  He does feel its been worse in tunneling recently.  He thinks he might of had a fever.  Prior history of cholecystectomy and hernia repair when he was young.  I reviewed CT findings with him of possible bowel obstruction and possible osteomyelitis of his buttock wound.  He said he seen a at [facility] who does not want to operate on him.  He is also worried that he has 2 dogs to care for at home. ? ?Abdominal exam is soft without signs of peritonitis.  Minimal lower abdominal tenderness.  As far as his wound he has a 3 cm defect right lower buttock which tracks deep.  There is no purulent drainage and there is no surrounding erythema or induration.  He is otherwise nontoxic-appearing ? ?1630.  Discussed with Dr. [facility].  She recommends the patient be admitted to a medical service, NG tube, IV fluids and bowel rest.  She will evaluate patient tomorrow. ? ?1730.  Discussed with [facility] hospitalist Dr. Robyne Peers who will evaluate patient for admission.  Patient was able to find somebody to  watch his dogs.  Ordered his home morphine.  Ordered NG tube per surgery recommendations. ? ? ? ? ?  ?Adrian Blackwater, MD ?06/28/21 1009 ? ?

## 2021-06-28 ENCOUNTER — Inpatient Hospital Stay (HOSPITAL_COMMUNITY): Payer: Medicare Other

## 2021-06-28 DIAGNOSIS — K56609 Unspecified intestinal obstruction, unspecified as to partial versus complete obstruction: Secondary | ICD-10-CM | POA: Diagnosis not present

## 2021-06-28 LAB — BASIC METABOLIC PANEL
Anion gap: 8 (ref 5–15)
BUN: 12 mg/dL (ref 6–20)
CO2: 27 mmol/L (ref 22–32)
Calcium: 8.6 mg/dL — ABNORMAL LOW (ref 8.9–10.3)
Chloride: 102 mmol/L (ref 98–111)
Creatinine, Ser: 0.63 mg/dL (ref 0.61–1.24)
GFR, Estimated: >60 mL/min (ref >60)
Glucose, Bld: 64 mg/dL — ABNORMAL LOW (ref 70–99)
Potassium: 3.9 mmol/L (ref 3.5–5.1)
Sodium: 137 mmol/L (ref 135–145)

## 2021-06-28 LAB — CBC
HCT: 34.8 % — ABNORMAL LOW (ref 39.0–52.0)
Hemoglobin: 11 g/dL — ABNORMAL LOW (ref 13.0–17.0)
MCH: 25.7 pg — ABNORMAL LOW (ref 26.0–34.0)
MCHC: 31.6 g/dL (ref 30.0–36.0)
MCV: 81.3 fL (ref 80.0–100.0)
Platelets: 247 10*3/uL (ref 150–400)
RBC: 4.28 MIL/uL (ref 4.22–5.81)
RDW: 13.9 % (ref 11.5–15.5)
WBC: 7.2 10*3/uL (ref 4.0–10.5)
nRBC: 0 % (ref 0.0–0.2)

## 2021-06-28 LAB — HIV ANTIBODY (ROUTINE TESTING W REFLEX): HIV Screen 4th Generation wRfx: NONREACTIVE

## 2021-06-28 LAB — GLUCOSE, CAPILLARY
Glucose-Capillary: 108 mg/dL — ABNORMAL HIGH (ref 70–99)
Glucose-Capillary: 166 mg/dL — ABNORMAL HIGH (ref 70–99)
Glucose-Capillary: 56 mg/dL — ABNORMAL LOW (ref 70–99)
Glucose-Capillary: 81 mg/dL (ref 70–99)

## 2021-06-28 LAB — C-REACTIVE PROTEIN: CRP: 5.5 mg/dL — ABNORMAL HIGH (ref <1.0)

## 2021-06-28 MED ORDER — SODIUM CHLORIDE 0.9 % IV SOLN
100.0000 mg | Freq: Two times a day (BID) | INTRAVENOUS | Status: AC
  Administered 2021-06-28 – 2021-07-04 (×13): 100 mg via INTRAVENOUS
  Filled 2021-06-28 (×18): qty 100

## 2021-06-28 MED ORDER — SODIUM CHLORIDE 0.9 % IV SOLN
2.0000 g | INTRAVENOUS | Status: DC
  Administered 2021-06-28 – 2021-07-01 (×4): 2 g via INTRAVENOUS
  Filled 2021-06-28 (×4): qty 20

## 2021-06-28 MED ORDER — BISACODYL 10 MG RE SUPP
10.0000 mg | Freq: Every day | RECTAL | Status: DC
  Administered 2021-06-28 – 2021-06-30 (×3): 10 mg via RECTAL
  Filled 2021-06-28 (×3): qty 1

## 2021-06-28 MED ORDER — DEXTROSE 50 % IV SOLN
12.5000 g | INTRAVENOUS | Status: AC
  Administered 2021-06-28: 12.5 g via INTRAVENOUS
  Filled 2021-06-28: qty 50

## 2021-06-28 MED ORDER — HYDROMORPHONE HCL 1 MG/ML IJ SOLN
1.0000 mg | INTRAMUSCULAR | Status: DC | PRN
  Administered 2021-06-28 – 2021-06-29 (×5): 1 mg via INTRAVENOUS
  Filled 2021-06-28 (×5): qty 1

## 2021-06-28 MED ORDER — DEXTROSE-NACL 5-0.9 % IV SOLN: INTRAVENOUS | Status: DC
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
## 2021-06-28 NOTE — Consult Note (Signed)
WOC Nurse Consult Note: ?Reason for Consult:Chronic nonhealing pressure injury to right ischial tuberosity: CT yesterday indicates suspicious for osteomyelitis   ?Wound type:Pressure, infectious ?Pressure Injury POA: Yes ?Measurement:Measured by Bedside RN E. Fierke on 06/27/21: ?2cm x 1.5cm x 2cm ?Wound bed:Red, moist ?Drainage (amount, consistency, odor) small ?Periwound: Intact with indications of previous wound healing ?Dressing procedure/placement/frequency: Wound to be dressed daily following a NS cleanse and a size appropriate piece of silver hydrofiber inserted into defect. The wound will then be covered with dry gauze and topped with a silicone foam. ? ?Patient has long been followed, albeit not regularly by Dr. Vernona Rieger at the [facility]. Last seen 6 months ago. Recommend follow up in that Clinic post discharge. ? ? ?[facility] nursing team will not follow, but will remain available to this patient, the nursing and medical teams.  Please re-consult if needed. ?Thanks, ?Maudie Flakes, MSN, RN, [place], [place], [facility], [place]  ?Pager# [phone]  ? ? ? ?  ?

## 2021-06-29 ENCOUNTER — Inpatient Hospital Stay (HOSPITAL_COMMUNITY): Payer: Medicare Other

## 2021-06-29 DIAGNOSIS — K56609 Unspecified intestinal obstruction, unspecified as to partial versus complete obstruction: Secondary | ICD-10-CM | POA: Diagnosis not present

## 2021-06-29 LAB — BASIC METABOLIC PANEL
Anion gap: 10 (ref 5–15)
BUN: 5 mg/dL — ABNORMAL LOW (ref 6–20)
CO2: 25 mmol/L (ref 22–32)
Calcium: 8.2 mg/dL — ABNORMAL LOW (ref 8.9–10.3)
Chloride: 99 mmol/L (ref 98–111)
Creatinine, Ser: 0.42 mg/dL — ABNORMAL LOW (ref 0.61–1.24)
GFR, Estimated: >60 mL/min (ref >60)
Glucose, Bld: 121 mg/dL — ABNORMAL HIGH (ref 70–99)
Potassium: 3.2 mmol/L — ABNORMAL LOW (ref 3.5–5.1)
Sodium: 134 mmol/L — ABNORMAL LOW (ref 135–145)

## 2021-06-29 LAB — GLUCOSE, CAPILLARY
Glucose-Capillary: 133 mg/dL — ABNORMAL HIGH (ref 70–99)
Glucose-Capillary: 114 mg/dL — ABNORMAL HIGH (ref 70–99)
Glucose-Capillary: 148 mg/dL — ABNORMAL HIGH (ref 70–99)

## 2021-06-29 LAB — MAGNESIUM: Magnesium: 1.7 mg/dL (ref 1.7–2.4)

## 2021-06-29 MED ORDER — METHADONE HCL 10 MG PO TABS
20.0000 mg | ORAL_TABLET | Freq: Two times a day (BID) | ORAL | Status: DC
  Administered 2021-06-29 – 2021-07-01 (×5): 20 mg via ORAL
  Filled 2021-06-29 (×5): qty 2

## 2021-06-29 MED ORDER — DIATRIZOATE MEGLUMINE & SODIUM 66-10 % PO SOLN
90.0000 mL | Freq: Once | ORAL | Status: AC
  Administered 2021-06-29: 90 mL via NASOGASTRIC
  Filled 2021-06-29: qty 90

## 2021-06-29 MED ORDER — POTASSIUM CHLORIDE 10 MEQ/100ML IV SOLN: 10.0000 meq | INTRAVENOUS | Status: DC

## 2021-06-29 MED ORDER — MAGNESIUM SULFATE 2 GM/50ML IV SOLN
2.0000 g | Freq: Once | INTRAVENOUS | Status: AC
  Administered 2021-06-29: 2 g via INTRAVENOUS

## 2021-06-29 MED ORDER — POTASSIUM CHLORIDE 10 MEQ/100ML IV SOLN
10.0000 meq | INTRAVENOUS | Status: AC
  Administered 2021-06-29 (×4): 10 meq via INTRAVENOUS
  Filled 2021-06-29 (×3): qty 100

## 2021-06-29 MED ORDER — HYDROMORPHONE HCL 1 MG/ML IJ SOLN
1.0000 mg | Freq: Three times a day (TID) | INTRAMUSCULAR | Status: DC | PRN
  Administered 2021-07-01: 1 mg via INTRAVENOUS
  Filled 2021-06-29: qty 1

## 2021-06-29 MED ORDER — KCL IN DEXTROSE-NACL 20-5-0.9 MEQ/L-%-% IV SOLN
INTRAVENOUS | Status: DC
Start: 2021-06-29 — End: 2021-07-08

## 2021-06-29 MED ORDER — DIATRIZOATE MEGLUMINE & SODIUM 66-10 % PO SOLN
ORAL | Status: AC
  Filled 2021-06-29: qty 90

## 2021-06-29 NOTE — Progress Notes (Signed)
[facility] Progress Note ? ?   ?Subjective: ?Patient seen and examined.  He is resting comfortably in bed.  He is complaining of generalized not feeling well and pain in his legs secondary to not taking his methadone.  He denies any abdominal pain, nausea, and vomiting.  He had a bowel movement yesterday, but but is unsure if he has passed any flatus.  He denies hearing any flatus.  NG tube is in place with about 250 cc of bilious output in canister.  He had 500 cc of output documented in the last 24 hours.  Patient stated that he took his home methadone this morning at 5 AM while his NG tube was clamped. ? ?Objective: ?Vital signs in last 24 hours: ?Temp:  [98 ?F (36.7 ?C)-98.4 ?F (36.9 ?C)] 98.4 ?F (36.9 ?C) (04/24 0423) ?Pulse Rate:  [71-76] 76 (04/24 0423) ?Resp:  [18-19] 18 (04/24 0423) ?BP: (140-144)/(76-95) 140/95 (04/24 0423) ?SpO2:  [98 %-100 %] 99 % (04/24 0423) ?Last BM Date : 06/28/21 ? ?Intake/Output from previous day: ?04/23 0701 - 04/24 0700 ?In: 1615.9 [I.V.:1265.9; IV Piggyback:350] ?Out: 2200 [Urine:1400; Emesis/NG output:800] ?Intake/Output this shift: ?Total I/O ?In: 299.9 [I.V.:299.9] ?Out: -  ? ?General appearance: alert, cooperative, and no distress ?GI: Abdomen soft, nondistended, no percussion tenderness, nontender to palpation, no rigidity, guarding, rebound tenderness; right subcostal cicatrix noted ? ?Lab Results:  ?Recent Labs  ?  06/27/21 ?1431 06/28/21 ?[id number]  ?WBC 13.1* 7.2  ?HGB 13.9 11.0*  ?HCT 43.0 34.8*  ?PLT 361 247  ? ?BMET ?Recent Labs  ?  06/28/21 ?[id number] 06/29/21 ?[id number]  ?NA 137 134*  ?K 3.9 3.2*  ?CL 102 99  ?CO2 27 25  ?GLUCOSE 64* 121*  ?BUN 12 5*  ?CREATININE 0.63 0.42*  ?CALCIUM 8.6* 8.2*  ? ?PT/INR ?No results for input(s): LABPROT, INR in the last 72 hours. ? ?Studies/Results: ?DG Chest 1 View ? ?Result Date: 06/28/2021 ?CLINICAL DATA:  NG tube placement EXAM: CHEST  1 VIEW COMPARISON:  06/28/2021 FINDINGS: NG tube is in place with the tip in the mid  stomach. Heart normal size. Aortic atherosclerosis. No confluent opacities or effusions. No acute bony abnormality. IMPRESSION: NG tube in the mid stomach. No acute cardiopulmonary disease. Electronically Signed   By: Charlett NoseKevin  Dover M.D.   On: 06/28/2021 19:27  ? ?DG Chest 1 View ? ?Result Date: 06/28/2021 ?CLINICAL DATA:  NG tube placement EXAM: CHEST  1 VIEW COMPARISON:  June 27, 2021 FINDINGS: The heart size and mediastinal contours are within normal limits. Both lungs are clear. Nasogastric tube is identified coiled with the tip in the distal esophagus, repositioning is recommended. IMPRESSION: Nasogastric tube is identified coiled with the tip in the distal esophagus, repositioning is recommended. These results will be called to the ordering clinician or representative by the Radiologist Assistant, and communication documented in the PACS or [employer] Dashboard. Electronically Signed   By: Sherian ReinWei-Chen  Lin M.D.   On: 06/28/2021 09:43  ? ?DG Chest 1 View ? ?Result Date: 06/27/2021 ?CLINICAL DATA:  NG tube placement EXAM: CHEST  1 VIEW COMPARISON:  11/28/2019 FINDINGS: NG tube tip is in the proximal stomach. Heart is normal size. Lungs clear. No effusions. Posterior spinal rods in place. No acute bony abnormality. IMPRESSION: NG tube tip in the proximal stomach. No acute cardiopulmonary disease. Electronically Signed   By: Charlett NoseKevin  Dover M.D.   On: 06/27/2021 19:25  ? ?CT ABDOMEN PELVIS W CONTRAST ? ?Result Date: 06/27/2021 ?CLINICAL DATA:  Left lower  quadrant abdominal pain. EXAM: CT ABDOMEN AND PELVIS WITH CONTRAST TECHNIQUE: Multidetector CT imaging of the abdomen and pelvis was performed using the standard protocol following bolus administration of intravenous contrast. RADIATION DOSE REDUCTION: This exam was performed according to the departmental dose-optimization program which includes automated exposure control, adjustment of the mA and/or kV according to patient size and/or use of iterative reconstruction technique.  CONTRAST:  OMNIPAQUE IOHEXOL 300 MG/ML  SOLN COMPARISON:  August 10, 2016 FINDINGS: Lower chest: No acute abnormality. Hepatobiliary: Post cholecystectomy. Pneumobilia, new finding from patient's prior CT exam. Resolved biliary ductal dilation. Pancreas: Unremarkable. No pancreatic ductal dilatation or surrounding inflammatory changes. Spleen: Normal in size without focal abnormality. Adrenals/Urinary Tract: Adrenal glands are unremarkable. Kidneys are normal, without renal calculi, focal lesion, or hydronephrosis. Bladder is unremarkable. Stomach/Bowel: Normal appearance the stomach and duodenum. Radial dilation of small bowel loops in the abdomen with fluid-filled distended small bowel loops measuring up to 3.5 cm. Transitional point in the right lower abdomen, at the level of the distal ileum. Preserved bowel gas pattern of the colon. Normal appendix. Vascular/Lymphatic: Aortic atherosclerosis. No enlarged abdominal or pelvic lymph nodes. IVC filter unchanged. Reproductive: Prostate is unremarkable. Other: Worsening of right decubital ulcer with lytic and sclerotic changes in the right inferior pubic ramus, suspicious for osteomyelitis. Musculoskeletal: Stable postsurgical changes of the spine and left femur. IMPRESSION: 1. Early or incomplete small bowel obstruction with transition point in the right lower abdomen, at the level of the distal ileum. 2. Pneumobilia, new finding from patient's prior CT exam. Resolved biliary ductal dilation. 3. Worsening of right decubital ulcer with lytic and sclerotic changes in the right inferior pubic ramus, suspicious for osteomyelitis. Aortic Atherosclerosis (ICD10-I70.0). Electronically Signed   By: Ted Mcalpine M.D.   On: 06/27/2021 15:36  ? ?DG Chest Port 1V same Day ? ?Result Date: 06/28/2021 ?CLINICAL DATA:  NG tube placement EXAM: PORTABLE CHEST 1 VIEW COMPARISON:  06/28/2021, 11 a.m. FINDINGS: Esophagogastric tube has been repositioned, with tip below the  diaphragm, side port near the gastroesophageal junction. Cardiomegaly without acute abnormality of the lungs. Thoracolumbar spinal fusion. IMPRESSION: Esophagogastric tube has been repositioned, with tip below the diaphragm, side port near the gastroesophageal junction. Recommend advancement to ensure subdiaphragmatic positioning of both tip and side port. Electronically Signed   By: Jearld Lesch M.D.   On: 06/28/2021 16:12  ? ?DG Chest Port 1V same Day ? ?Result Date: 06/28/2021 ?CLINICAL DATA:  Evaluate enteric/NG tube. EXAM: PORTABLE CHEST 1 VIEW COMPARISON:  Film earlier today FINDINGS: The enteric/NG tube is partially coiled in the stomach but tip extends back cephalad into the esophagus. Repositioning recommended. No other changes identified. IMPRESSION: Enteric/NG tube partially coiled in the stomach but tip extends back cephalad into the esophagus. Repositioning recommended. Electronically Signed   By: Harmon Pier M.D.   On: 06/28/2021 11:21  ? ?DG ABD ACUTE 2+V W 1V CHEST ? ?Result Date: 06/29/2021 ?CLINICAL DATA:  Small-bowel obstruction EXAM: DG ABDOMEN ACUTE WITH 1 VIEW CHEST COMPARISON:  Chest radiograph of 1 day prior.  CT 06/27/2021. FINDINGS: Frontal view of the chest demonstrates thoracolumbar spine fixation. Nasogastric tube with tip at gastric body and side port in the region of the gastric cardia. Midline trachea. Normal heart size. Atherosclerosis in the transverse aorta. No pleural effusion or pneumothorax. Clear lungs. Abdominal films demonstrate no free intraperitoneal air on upright positioning. No significant air-fluid levels. Mid small bowel loops which measure up to 3.9 cm on supine imaging. Normal caliber  ascending colon. IVC filter.  Cholecystectomy clips. Right inferior pubic ramus absence is presumably related to osteomyelitis given appearance on CT. IMPRESSION: Left abdominal mild small bowel distension, possibly related to low-grade small-bowel obstruction. No free intraperitoneal  air or other acute complication. Right inferior pubic ramus osteomyelitis, as on CT. Electronically Signed   By: Jeronimo Greaves M.D.   On: 06/29/2021 10:40   ? ?Anti-infectives: ?Anti-infectives (From admission
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
## 2021-06-29 NOTE — TOC Progression Note (Signed)
?  Transition of Care (TOC) Screening Note ? ? ?Patient Details  ?Name: [name] ?Date of Birth: 04/16/1960 ? ? ?Transition of Care (TOC) CM/SW Contact:    ?Elliot Gault, LCSW ?Phone Number: ?06/29/2021, 11:00 AM ? ? ? ?Transition of Care Department [facility]) has reviewed patient and no TOC needs have been identified at this time. We will continue to monitor patient advancement through interdisciplinary progression rounds. If new patient transition needs arise, please place a TOC consult. ? ? ?

## 2021-06-30 ENCOUNTER — Inpatient Hospital Stay (HOSPITAL_COMMUNITY): Payer: Medicare Other

## 2021-06-30 DIAGNOSIS — K56609 Unspecified intestinal obstruction, unspecified as to partial versus complete obstruction: Secondary | ICD-10-CM | POA: Diagnosis not present

## 2021-06-30 LAB — CBC
HCT: 34.8 % — ABNORMAL LOW (ref 39.0–52.0)
Hemoglobin: 11.4 g/dL — ABNORMAL LOW (ref 13.0–17.0)
MCH: 26 pg (ref 26.0–34.0)
MCHC: 32.8 g/dL (ref 30.0–36.0)
MCV: 79.3 fL — ABNORMAL LOW (ref 80.0–100.0)
Platelets: 310 10*3/uL (ref 150–400)
RBC: 4.39 MIL/uL (ref 4.22–5.81)
RDW: 13.6 % (ref 11.5–15.5)
WBC: 6.3 10*3/uL (ref 4.0–10.5)
nRBC: 0 % (ref 0.0–0.2)

## 2021-06-30 LAB — RENAL FUNCTION PANEL
Albumin: 2.9 g/dL — ABNORMAL LOW (ref 3.5–5.0)
Anion gap: 7 (ref 5–15)
BUN: <5 mg/dL — ABNORMAL LOW (ref 6–20)
CO2: 26 mmol/L (ref 22–32)
Calcium: 8.5 mg/dL — ABNORMAL LOW (ref 8.9–10.3)
Chloride: 103 mmol/L (ref 98–111)
Creatinine, Ser: 0.44 mg/dL — ABNORMAL LOW (ref 0.61–1.24)
GFR, Estimated: >60 mL/min (ref >60)
Glucose, Bld: 125 mg/dL — ABNORMAL HIGH (ref 70–99)
Phosphorus: 2.6 mg/dL (ref 2.5–4.6)
Potassium: 3.7 mmol/L (ref 3.5–5.1)
Sodium: 136 mmol/L (ref 135–145)

## 2021-06-30 LAB — URINE CULTURE: Culture: 80000 — AB

## 2021-06-30 LAB — GLUCOSE, CAPILLARY
Glucose-Capillary: 126 mg/dL — ABNORMAL HIGH (ref 70–99)
Glucose-Capillary: 128 mg/dL — ABNORMAL HIGH (ref 70–99)
Glucose-Capillary: 133 mg/dL — ABNORMAL HIGH (ref 70–99)
Glucose-Capillary: 97 mg/dL (ref 70–99)

## 2021-06-30 MED ORDER — BISACODYL 10 MG RE SUPP
10.0000 mg | Freq: Two times a day (BID) | RECTAL | Status: DC
Start: 2021-06-30 — End: 2021-07-08
  Administered 2021-06-30 – 2021-07-06 (×10): 10 mg via RECTAL
  Filled 2021-06-30 (×14): qty 1

## 2021-06-30 NOTE — Progress Notes (Signed)
Update Note: ? ?Patient received a 24 hour film today to evaluate for contrast in the colon.  I spoke with radiology, who states that the contrast is fairly dilute, but she does not see contrast in the colon.  The small bowel that was distended on imaging yesterday and containing contrast are less prominent, and the contrast is noted to be in more distal small bowel.  I also discussed this imaging and the patient with Dr. Henreitta Leber.  Given that he is hemodynamically stable without leukocytosis and with a soft abdomen, will continue with conservative management at this time.  I suspect that the patient has slow transit secondary to his paraplegia and chronic opioid use, but the contrast is progressing farther through the small bowel.  Will place NG tube to gravity and evaluate if patient has N/V or increasing abdominal pain.  Will also give additional dulcolax suppository and will plan to get repeat abdominal xray tomorrow morning.  Continue to monitor for bowel movements. ? ?Theophilus Kinds, DO ?[facility] ?[address] ?[place], [place] [address] ?[phone] (office) ? ?

## 2021-06-30 NOTE — Progress Notes (Signed)
[facility] Progress Note ? ?   ?Subjective: ?Patient seen and examined.  He is resting comfortably in bed.  He denies abdominal pain, nausea, and vomiting.  He also denies bowel movements or audible flatus since receiving the contrast yesterday.  He is complaining of overall not well feeling from being due for his methadone dose.  He has 300 cc of bilious output in the cannister and 850 cc documented yesterday. ? ?Objective: ?Vital signs in last 24 hours: ?Temp:  [97.8 ?F (36.6 ?C)-98.4 ?F (36.9 ?C)] 97.8 ?F (36.6 ?C) (04/25 0445) ?Pulse Rate:  [72-74] 73 (04/25 0445) ?Resp:  [18-19] 19 (04/25 0445) ?BP: (131-142)/(80-89) 139/86 (04/25 0445) ?SpO2:  [98 %-100 %] 99 % (04/25 0445) ?Last BM Date : 06/28/21 ? ?Intake/Output from previous day: ?04/24 0701 - 04/25 0700 ?In: 2734 [I.V.:1934; IV Piggyback:800] ?Out: 2350 [Urine:1500; Emesis/NG output:850] ?Intake/Output this shift: ?Total I/O ?In: -  ?Out: 200 [Urine:200] ? ?General appearance: alert, cooperative, and no distress ?Nose: Ng tube in place ?GI: abdomen soft, nondistended, nontender to percussion and palpation; no rigidity, guarding, or rebound tenderness; right subcostal cicatrix noted ? ?Lab Results:  ?Recent Labs  ?  06/28/21 ?7517 06/30/21 ?0451  ?WBC 7.2 6.3  ?HGB 11.0* 11.4*  ?HCT 34.8* 34.8*  ?PLT 247 310  ? ?BMET ?Recent Labs  ?  06/29/21 ?0017 06/30/21 ?0451  ?NA 134* 136  ?K 3.2* 3.7  ?CL 99 103  ?CO2 25 26  ?GLUCOSE 121* 125*  ?BUN 5* <5*  ?CREATININE 0.42* 0.44*  ?CALCIUM 8.2* 8.5*  ? ?PT/INR ?No results for input(s): LABPROT, INR in the last 72 hours. ? ?Studies/Results: ?DG Chest 1 View ? ?Result Date: 06/28/2021 ?CLINICAL DATA:  NG tube placement EXAM: CHEST  1 VIEW COMPARISON:  06/28/2021 FINDINGS: NG tube is in place with the tip in the mid stomach. Heart normal size. Aortic atherosclerosis. No confluent opacities or effusions. No acute bony abnormality. IMPRESSION: NG tube in the mid stomach. No acute cardiopulmonary  disease. Electronically Signed   By: Charlett Nose M.D.   On: 06/28/2021 19:27  ? ?DG Chest 1 View ? ?Result Date: 06/28/2021 ?CLINICAL DATA:  NG tube placement EXAM: CHEST  1 VIEW COMPARISON:  June 27, 2021 FINDINGS: The heart size and mediastinal contours are within normal limits. Both lungs are clear. Nasogastric tube is identified coiled with the tip in the distal esophagus, repositioning is recommended. IMPRESSION: Nasogastric tube is identified coiled with the tip in the distal esophagus, repositioning is recommended. These results will be called to the ordering clinician or representative by the Radiologist Assistant, and communication documented in the PACS or [employer]. Electronically Signed   By: Sherian Rein M.D.   On: 06/28/2021 09:43  ? ?DG Chest Port 1V same Day ? ?Result Date: 06/28/2021 ?CLINICAL DATA:  NG tube placement EXAM: PORTABLE CHEST 1 VIEW COMPARISON:  06/28/2021, 11 a.m. FINDINGS: Esophagogastric tube has been repositioned, with tip below the diaphragm, side port near the gastroesophageal junction. Cardiomegaly without acute abnormality of the lungs. Thoracolumbar spinal fusion. IMPRESSION: Esophagogastric tube has been repositioned, with tip below the diaphragm, side port near the gastroesophageal junction. Recommend advancement to ensure subdiaphragmatic positioning of both tip and side port. Electronically Signed   By: Jearld Lesch M.D.   On: 06/28/2021 16:12  ? ?DG Chest Port 1V same Day ? ?Result Date: 06/28/2021 ?CLINICAL DATA:  Evaluate enteric/NG tube. EXAM: PORTABLE CHEST 1 VIEW COMPARISON:  Film earlier today FINDINGS: The enteric/NG tube is partially coiled  in the stomach but tip extends back cephalad into the esophagus. Repositioning recommended. No other changes identified. IMPRESSION: Enteric/NG tube partially coiled in the stomach but tip extends back cephalad into the esophagus. Repositioning recommended. Electronically Signed   By: Harmon PierJeffrey  Hu M.D.   On: 06/28/2021  11:21  ? ?DG ABD ACUTE 2+V W 1V CHEST ? ?Result Date: 06/29/2021 ?CLINICAL DATA:  Small-bowel obstruction EXAM: DG ABDOMEN ACUTE WITH 1 VIEW CHEST COMPARISON:  Chest radiograph of 1 day prior.  CT 06/27/2021. FINDINGS: Frontal view of the chest demonstrates thoracolumbar spine fixation. Nasogastric tube with tip at gastric body and side port in the region of the gastric cardia. Midline trachea. Normal heart size. Atherosclerosis in the transverse aorta. No pleural effusion or pneumothorax. Clear lungs. Abdominal films demonstrate no free intraperitoneal air on upright positioning. No significant air-fluid levels. Mid small bowel loops which measure up to 3.9 cm on supine imaging. Normal caliber ascending colon. IVC filter.  Cholecystectomy clips. Right inferior pubic ramus absence is presumably related to osteomyelitis given appearance on CT. IMPRESSION: Left abdominal mild small bowel distension, possibly related to low-grade small-bowel obstruction. No free intraperitoneal air or other acute complication. Right inferior pubic ramus osteomyelitis, as on CT. Electronically Signed   By: Jeronimo GreavesKyle  Talbot M.D.   On: 06/29/2021 10:40  ? ?DG Abd Portable 1V-Small Bowel Obstruction Protocol-initial, 8 hr delay ? ?Result Date: 06/29/2021 ?CLINICAL DATA:  NG tube. EXAM: PORTABLE ABDOMEN - 1 VIEW COMPARISON:  Abdominal series 06/29/2021 FINDINGS: Nasogastric tube tip is in the proximal stomach. Dilated small bowel loops in the left upper quadrant measure up to 5 cm and have increased in size. There are surgical clips in the right abdomen. IVC filter is present. Thoracolumbar fusion hardware is present. IMPRESSION: 1. Nasogastric tube tip in the proximal stomach. Recommend advancing tube. 2. Dilated small bowel in the upper abdomen has increased in size concerning for small bowel obstruction. Correlate clinically. Electronically Signed   By: Darliss CheneyAmy  Guttmann M.D.   On: 06/29/2021 20:30   ? ?Anti-infectives: ?Anti-infectives (From  admission, onward)  ? ? Start     Dose/Rate Route Frequency Ordered Stop  ? 06/28/21 2000  doxycycline (VIBRAMYCIN) 100 mg in sodium chloride 0.9 % 250 mL IVPB       ? 100 mg ?125 mL/hr over 120 Minutes Intravenous Every 12 hours 06/28/21 1820    ? 06/28/21 1900  cefTRIAXone (ROCEPHIN) 2 g in sodium chloride 0.9 % 100 mL IVPB       ? 2 g ?200 mL/hr over 30 Minutes Intravenous Every 24 hours 06/28/21 1811    ? ?  ? ? ?Assessment/Plan: ? ?Patient is a 61 year old male who is admitted with a small bowel obstruction.  CT abdomen and pelvis with early or incomplete small bowel obstruction with transition point in the right lower abdomen, at the level of the distal ileum. ? ?-Patient underwent small bowel obstruction protocol yesterday.  The 8 hour film looks like there is a small amount of contrast in the colon, however there are dilated loops of small bowel to 5 cm still containing contrast ?-Given this imaging finding with no bowel movements from the patient, will obtain a 24 hour film to evaluate for further progression of the contrast ?-Patient is a paraplegic with chronic opioid use, so I suspect that the patient has slow motility and constipation superimposed on his current bowel obstruction picture ?-NG to LIS ?-NPO ?-IVF ?-Methadone per primary team ?-Dulcolax ?-Further recommendations to follow  imaging ?-Appreciate hospitalist recommendations ? ?Chene Kasinger, DO ?[facility] ?[address] ?[place], [place] [address] ?[phone] (office) ? ? ? LOS: 3 days  ? ? ?Dareth Andrew A Trevel Dillenbeck ?06/30/2021 ? ?
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
## 2021-07-01 ENCOUNTER — Inpatient Hospital Stay (HOSPITAL_COMMUNITY): Payer: Medicare Other

## 2021-07-01 DIAGNOSIS — M86651 Other chronic osteomyelitis, right thigh: Secondary | ICD-10-CM | POA: Diagnosis not present

## 2021-07-01 DIAGNOSIS — G894 Chronic pain syndrome: Secondary | ICD-10-CM | POA: Diagnosis not present

## 2021-07-01 DIAGNOSIS — K56609 Unspecified intestinal obstruction, unspecified as to partial versus complete obstruction: Secondary | ICD-10-CM | POA: Diagnosis not present

## 2021-07-01 DIAGNOSIS — K56600 Partial intestinal obstruction, unspecified as to cause: Secondary | ICD-10-CM | POA: Diagnosis not present

## 2021-07-01 DIAGNOSIS — G8220 Paraplegia, unspecified: Secondary | ICD-10-CM

## 2021-07-01 LAB — BASIC METABOLIC PANEL
Anion gap: 7 (ref 5–15)
BUN: <5 mg/dL — ABNORMAL LOW (ref 6–20)
CO2: 25 mmol/L (ref 22–32)
Calcium: 8.7 mg/dL — ABNORMAL LOW (ref 8.9–10.3)
Chloride: 104 mmol/L (ref 98–111)
Creatinine, Ser: 0.43 mg/dL — ABNORMAL LOW (ref 0.61–1.24)
GFR, Estimated: >60 mL/min (ref >60)
Glucose, Bld: 111 mg/dL — ABNORMAL HIGH (ref 70–99)
Potassium: 3.7 mmol/L (ref 3.5–5.1)
Sodium: 136 mmol/L (ref 135–145)

## 2021-07-01 LAB — GLUCOSE, CAPILLARY
Glucose-Capillary: 101 mg/dL — ABNORMAL HIGH (ref 70–99)
Glucose-Capillary: 113 mg/dL — ABNORMAL HIGH (ref 70–99)
Glucose-Capillary: 115 mg/dL — ABNORMAL HIGH (ref 70–99)
Glucose-Capillary: 116 mg/dL — ABNORMAL HIGH (ref 70–99)
Glucose-Capillary: 90 mg/dL (ref 70–99)
Glucose-Capillary: 93 mg/dL (ref 70–99)

## 2021-07-01 MED ORDER — HYDROMORPHONE HCL 1 MG/ML IJ SOLN
0.5000 mg | Freq: Three times a day (TID) | INTRAMUSCULAR | Status: DC | PRN
  Filled 2021-07-01 (×2): qty 0.5

## 2021-07-01 MED ORDER — IOHEXOL 300 MG/ML  SOLN
100.0000 mL | Freq: Once | INTRAMUSCULAR | Status: AC | PRN
  Administered 2021-07-01: 75 mL via INTRAVENOUS

## 2021-07-01 MED ORDER — METHADONE HCL 10 MG PO TABS
20.0000 mg | ORAL_TABLET | Freq: Three times a day (TID) | ORAL | Status: DC
  Administered 2021-07-01 – 2021-07-08 (×21): 20 mg via ORAL
  Filled 2021-07-01 (×21): qty 2

## 2021-07-01 MED ORDER — ENOXAPARIN SODIUM 40 MG/0.4ML IJ SOSY
40.0000 mg | PREFILLED_SYRINGE | INTRAMUSCULAR | Status: DC
  Administered 2021-07-03 – 2021-07-07 (×5): 40 mg via SUBCUTANEOUS
  Filled 2021-07-01 (×5): qty 0.4

## 2021-07-01 NOTE — Progress Notes (Addendum)
[facility] Progress Note ? ?   ?Subjective: ?Patient seen and examined.  He is resting comfortably in bed.  He denies nausea and vomiting since his NG was placed on gravity.  He denies significant abdominal pain.  He believes that he has heard himself pass a small amount of flatus, and confirms a small smear of stool in his diaper.  He is very anxious and stating he needs to leave the hospital soon. ? ?Objective: ?Vital signs in last 24 hours: ?Temp:  [97.8 ?F (36.6 ?C)-98 ?F (36.7 ?C)] 97.8 ?F (36.6 ?C) (04/26 16100623) ?Pulse Rate:  [67-76] 76 (04/26 96040623) ?Resp:  [16-19] 16 (04/26 54090623) ?BP: (136-161)/(76-91) 140/91 (04/26 81190623) ?SpO2:  [98 %-100 %] 100 % (04/26 14780623) ?Last BM Date : 06/28/21 ? ?Intake/Output from previous day: ?04/25 0701 - 04/26 0700 ?In: 0  ?Out: 2200 [Urine:2200] ?Intake/Output this shift: ?No intake/output data recorded. ? ?General appearance: alert, cooperative, and no distress ?Nose: NG tube in place ?GI: Abdomen soft, mild distention, no percussion tenderness, nontender to palpation, no rigidity, guarding, rebound tenderness; right subcostal cicatrix noted ? ?Lab Results:  ?Recent Labs  ?  06/30/21 ?0451  ?WBC 6.3  ?HGB 11.4*  ?HCT 34.8*  ?PLT 310  ? ?BMET ?Recent Labs  ?  06/30/21 ?0451 07/01/21 ?0449  ?NA 136 136  ?K 3.7 3.7  ?CL 103 104  ?CO2 26 25  ?GLUCOSE 125* 111*  ?BUN <5* <5*  ?CREATININE 0.44* 0.43*  ?CALCIUM 8.5* 8.7*  ? ?PT/INR ?No results for input(s): LABPROT, INR in the last 72 hours. ? ?Studies/Results: ?CT ABDOMEN PELVIS W CONTRAST ? ?Result Date: 07/01/2021 ?CLINICAL DATA:  Evaluate for small bowel obstruction EXAM: CT ABDOMEN AND PELVIS WITH CONTRAST TECHNIQUE: Multidetector CT imaging of the abdomen and pelvis was performed using the standard protocol following bolus administration of intravenous contrast. RADIATION DOSE REDUCTION: This exam was performed according to the departmental dose-optimization program which includes automated exposure control,  adjustment of the mA and/or kV according to patient size and/or use of iterative reconstruction technique. CONTRAST:  75mL OMNIPAQUE IOHEXOL 300 MG/ML  SOLN COMPARISON:  06/27/2021 FINDINGS: Lower chest: No acute abnormality. Hepatobiliary: No focal liver abnormality is seen. Status post cholecystectomy. No biliary dilatation. Minimal postoperative pneumobilia. Pancreas: Unremarkable. No pancreatic ductal dilatation or surrounding inflammatory changes. Spleen: Normal in size without significant abnormality. Adrenals/Urinary Tract: Adrenal glands are unremarkable. Kidneys are normal, without renal calculi, solid lesion, or hydronephrosis. Bladder is unremarkable. Stomach/Bowel: Stomach is within normal limits. Appendix appears normal. Diffusely distended mid to distal small bowel, largest loops measuring up to 4.7 cm in caliber, again with an abrupt caliber change in the right hemiabdomen and decompression of the distal ileum to the ileocecal valve (series 2, image 42, series 6, image 47). There is scattered gas and stool present in the colon to the rectum. Wall thickening and mucosal hyperenhancement of the rectum (series 2, image 63, 74). Vascular/Lymphatic: Aortic atherosclerosis. Infrarenal IVC filter. No enlarged abdominal or pelvic lymph nodes. Reproductive: No mass or other significant abnormality. Other: No abdominal wall hernia or abnormality. No ascites. Musculoskeletal: No acute osseous findings. Large decubitus ulceration of the right buttock, open to bone, with severe, chronic bony destruction of the right ischial ramus (series 2, image 81). IMPRESSION: 1. Diffusely distended mid to distal small bowel, largest loops measuring up to 4.7 cm in caliber, again with an abrupt caliber change in the right hemiabdomen and decompression of the distal ileum to the ileocecal valve. There is scattered  gas and stool present in the colon to the rectum. Findings are consistent with partial small bowel obstruction. 2.  Wall thickening and mucosal hyperenhancement of the rectum, consistent with nonspecific infectious, inflammatory, or ischemic proctitis. 3. Large decubitus ulceration of the right buttock, open to bone, with chronic osteomyelitis and bony destruction of the right ischial ramus. Aortic Atherosclerosis (ICD10-I70.0). Electronically Signed   By: Jearld Lesch M.D.   On: 07/01/2021 10:21  ? ?DG Abd Portable 1V-Small Bowel Obstruction Protocol-initial, 8 hr delay ? ?Result Date: 06/29/2021 ?CLINICAL DATA:  NG tube. EXAM: PORTABLE ABDOMEN - 1 VIEW COMPARISON:  Abdominal series 06/29/2021 FINDINGS: Nasogastric tube tip is in the proximal stomach. Dilated small bowel loops in the left upper quadrant measure up to 5 cm and have increased in size. There are surgical clips in the right abdomen. IVC filter is present. Thoracolumbar fusion hardware is present. IMPRESSION: 1. Nasogastric tube tip in the proximal stomach. Recommend advancing tube. 2. Dilated small bowel in the upper abdomen has increased in size concerning for small bowel obstruction. Correlate clinically. Electronically Signed   By: Darliss Cheney M.D.   On: 06/29/2021 20:30  ? ?DG Abd Portable 1V-Small Bowel Obstruction Protocol-24 hr delay ? ?Result Date: 06/30/2021 ?CLINICAL DATA:  SBO protocol EXAM: PORTABLE ABDOMEN - 1 VIEW COMPARISON:  Abdominal radiograph dated June 29, 2021 FINDINGS: NG tube tip is in the proximal stomach with side port near the GE junction. Prominent gas-filled loops of small bowel, somewhat less distended when compared with prior exam. Contrast material is seen in the distal small bowel. IMPRESSION: 1. Gas-filled loops of proximal small bowel are less distended when compared with prior exam. Contrast material is seen in the distal small bowel. 2. NG tube tip is in the proximal stomach with side port near the GE junction. Recommend advancement for optimal positioning. Electronically Signed   By: Allegra Lai M.D.   On: 06/30/2021 11:52    ? ?Anti-infectives: ?Anti-infectives (From admission, onward)  ? ? Start     Dose/Rate Route Frequency Ordered Stop  ? 06/28/21 2000  doxycycline (VIBRAMYCIN) 100 mg in sodium chloride 0.9 % 250 mL IVPB       ? 100 mg ?125 mL/hr over 120 Minutes Intravenous Every 12 hours 06/28/21 1820    ? 06/28/21 1900  cefTRIAXone (ROCEPHIN) 2 g in sodium chloride 0.9 % 100 mL IVPB       ? 2 g ?200 mL/hr over 30 Minutes Intravenous Every 24 hours 06/28/21 1811    ? ?  ? ? ?Assessment/Plan: ? ?Patient is a 61 year old male who was admitted with small bowel obstruction. ? ?-He underwent a repeat CT abdomen and pelvis today to further evaluate the location of his contrast, and this demonstrated concern for an incomplete bowel obstruction with no contrast in the colon ?-Given these findings, I discussed with the patient that he will need surgical intervention ?-Tentatively scheduled for exploratory laparotomy, lysis of adhesions, and possible small bowel resection on 4/27 at 7:30 AM ?-The risk and benefits of exploratory laparotomy, lysis of adhesions, possible small bowel resection were discussed with the patient, including but not limited to bleeding, infection, injury to surrounding structures, need for additional procedures. ?-I further explained that postoperatively, he will need to be in the hospital for a couple more days with NG tube initially still in his nose until he starts having bowel function. ?-Patient is agreeable to surgery and gave consent. ?-I did explain that he will not be discharged home with  any new narcotic pain medications. He understands and he is agreeable ?-NG tube to be placed back on low intermittent suction ?-NPO ?-IVF ?-Methadone per primary team ?-Will message wound care nurse about possible different mattress for patient ?-Appreciate hospitalist recommendations ? ? LOS: 4 days  ? ? ?Nillie Bartolotta A Raelea Gosse ?07/01/2021 ? ?
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
## 2021-07-01 NOTE — H&P (View-Only) (Signed)
[facility] Progress Note ? ?   ?Subjective: ?Patient seen and examined.  He is resting comfortably in bed.  He denies nausea and vomiting since his NG was placed on gravity.  He denies significant abdominal pain.  He believes that he has heard himself pass a small amount of flatus, and confirms a small smear of stool in his diaper.  He is very anxious and stating he needs to leave the hospital soon. ? ?Objective: ?Vital signs in last 24 hours: ?Temp:  [97.8 ?F (36.6 ?C)-98 ?F (36.7 ?C)] 97.8 ?F (36.6 ?C) (04/26 0623) ?Pulse Rate:  [67-76] 76 (04/26 0623) ?Resp:  [16-19] 16 (04/26 0623) ?BP: (136-161)/(76-91) 140/91 (04/26 0623) ?SpO2:  [98 %-100 %] 100 % (04/26 0623) ?Last BM Date : 06/28/21 ? ?Intake/Output from previous day: ?04/25 0701 - 04/26 0700 ?In: 0  ?Out: 2200 [Urine:2200] ?Intake/Output this shift: ?No intake/output data recorded. ? ?General appearance: alert, cooperative, and no distress ?Nose: NG tube in place ?GI: Abdomen soft, mild distention, no percussion tenderness, nontender to palpation, no rigidity, guarding, rebound tenderness; right subcostal cicatrix noted ? ?Lab Results:  ?Recent Labs  ?  06/30/21 ?0451  ?WBC 6.3  ?HGB 11.4*  ?HCT 34.8*  ?PLT 310  ? ?BMET ?Recent Labs  ?  06/30/21 ?0451 07/01/21 ?0449  ?NA 136 136  ?K 3.7 3.7  ?CL 103 104  ?CO2 26 25  ?GLUCOSE 125* 111*  ?BUN <5* <5*  ?CREATININE 0.44* 0.43*  ?CALCIUM 8.5* 8.7*  ? ?PT/INR ?No results for input(s): LABPROT, INR in the last 72 hours. ? ?Studies/Results: ?CT ABDOMEN PELVIS W CONTRAST ? ?Result Date: 07/01/2021 ?CLINICAL DATA:  Evaluate for small bowel obstruction EXAM: CT ABDOMEN AND PELVIS WITH CONTRAST TECHNIQUE: Multidetector CT imaging of the abdomen and pelvis was performed using the standard protocol following bolus administration of intravenous contrast. RADIATION DOSE REDUCTION: This exam was performed according to the departmental dose-optimization program which includes automated exposure control,  adjustment of the mA and/or kV according to patient size and/or use of iterative reconstruction technique. CONTRAST:  75mL OMNIPAQUE IOHEXOL 300 MG/ML  SOLN COMPARISON:  06/27/2021 FINDINGS: Lower chest: No acute abnormality. Hepatobiliary: No focal liver abnormality is seen. Status post cholecystectomy. No biliary dilatation. Minimal postoperative pneumobilia. Pancreas: Unremarkable. No pancreatic ductal dilatation or surrounding inflammatory changes. Spleen: Normal in size without significant abnormality. Adrenals/Urinary Tract: Adrenal glands are unremarkable. Kidneys are normal, without renal calculi, solid lesion, or hydronephrosis. Bladder is unremarkable. Stomach/Bowel: Stomach is within normal limits. Appendix appears normal. Diffusely distended mid to distal small bowel, largest loops measuring up to 4.7 cm in caliber, again with an abrupt caliber change in the right hemiabdomen and decompression of the distal ileum to the ileocecal valve (series 2, image 42, series 6, image 47). There is scattered gas and stool present in the colon to the rectum. Wall thickening and mucosal hyperenhancement of the rectum (series 2, image 63, 74). Vascular/Lymphatic: Aortic atherosclerosis. Infrarenal IVC filter. No enlarged abdominal or pelvic lymph nodes. Reproductive: No mass or other significant abnormality. Other: No abdominal wall hernia or abnormality. No ascites. Musculoskeletal: No acute osseous findings. Large decubitus ulceration of the right buttock, open to bone, with severe, chronic bony destruction of the right ischial ramus (series 2, image 81). IMPRESSION: 1. Diffusely distended mid to distal small bowel, largest loops measuring up to 4.7 cm in caliber, again with an abrupt caliber change in the right hemiabdomen and decompression of the distal ileum to the ileocecal valve. There is scattered   gas and stool present in the colon to the rectum. Findings are consistent with partial small bowel obstruction. 2.  Wall thickening and mucosal hyperenhancement of the rectum, consistent with nonspecific infectious, inflammatory, or ischemic proctitis. 3. Large decubitus ulceration of the right buttock, open to bone, with chronic osteomyelitis and bony destruction of the right ischial ramus. Aortic Atherosclerosis (ICD10-I70.0). Electronically Signed   By: Jearld Lesch M.D.   On: 07/01/2021 10:21  ? ?DG Abd Portable 1V-Small Bowel Obstruction Protocol-initial, 8 hr delay ? ?Result Date: 06/29/2021 ?CLINICAL DATA:  NG tube. EXAM: PORTABLE ABDOMEN - 1 VIEW COMPARISON:  Abdominal series 06/29/2021 FINDINGS: Nasogastric tube tip is in the proximal stomach. Dilated small bowel loops in the left upper quadrant measure up to 5 cm and have increased in size. There are surgical clips in the right abdomen. IVC filter is present. Thoracolumbar fusion hardware is present. IMPRESSION: 1. Nasogastric tube tip in the proximal stomach. Recommend advancing tube. 2. Dilated small bowel in the upper abdomen has increased in size concerning for small bowel obstruction. Correlate clinically. Electronically Signed   By: Darliss Cheney M.D.   On: 06/29/2021 20:30  ? ?DG Abd Portable 1V-Small Bowel Obstruction Protocol-24 hr delay ? ?Result Date: 06/30/2021 ?CLINICAL DATA:  SBO protocol EXAM: PORTABLE ABDOMEN - 1 VIEW COMPARISON:  Abdominal radiograph dated June 29, 2021 FINDINGS: NG tube tip is in the proximal stomach with side port near the GE junction. Prominent gas-filled loops of small bowel, somewhat less distended when compared with prior exam. Contrast material is seen in the distal small bowel. IMPRESSION: 1. Gas-filled loops of proximal small bowel are less distended when compared with prior exam. Contrast material is seen in the distal small bowel. 2. NG tube tip is in the proximal stomach with side port near the GE junction. Recommend advancement for optimal positioning. Electronically Signed   By: Allegra Lai M.D.   On: 06/30/2021 11:52    ? ?Anti-infectives: ?Anti-infectives (From admission, onward)  ? ? Start     Dose/Rate Route Frequency Ordered Stop  ? 06/28/21 2000  doxycycline (VIBRAMYCIN) 100 mg in sodium chloride 0.9 % 250 mL IVPB       ? 100 mg ?125 mL/hr over 120 Minutes Intravenous Every 12 hours 06/28/21 1820    ? 06/28/21 1900  cefTRIAXone (ROCEPHIN) 2 g in sodium chloride 0.9 % 100 mL IVPB       ? 2 g ?200 mL/hr over 30 Minutes Intravenous Every 24 hours 06/28/21 1811    ? ?  ? ? ?Assessment/Plan: ? ?Patient is a 61 year old male who was admitted with small bowel obstruction. ? ?-He underwent a repeat CT abdomen and pelvis today to further evaluate the location of his contrast, and this demonstrated concern for an incomplete bowel obstruction with no contrast in the colon ?-Given these findings, I discussed with the patient that he will need surgical intervention ?-Tentatively scheduled for exploratory laparotomy, lysis of adhesions, and possible small bowel resection on 4/27 at 7:30 AM ?-The risk and benefits of exploratory laparotomy, lysis of adhesions, possible small bowel resection were discussed with the patient, including but not limited to bleeding, infection, injury to surrounding structures, need for additional procedures. ?-I further explained that postoperatively, he will need to be in the hospital for a couple more days with NG tube initially still in his nose until he starts having bowel function. ?-Patient is agreeable to surgery and gave consent. ?-I did explain that he will not be discharged home with  any new narcotic pain medications. He understands and he is agreeable ?-NG tube to be placed back on low intermittent suction ?-NPO ?-IVF ?-Methadone per primary team ?-Will message wound care nurse about possible different mattress for patient ?-Appreciate hospitalist recommendations ? ? LOS: 4 days  ? ? ?Mancil Pfenning A Garren Greenman ?07/01/2021 ? ?

## 2021-07-01 NOTE — Progress Notes (Signed)
Patient switched to air mattress.  ?

## 2021-07-02 ENCOUNTER — Inpatient Hospital Stay (HOSPITAL_COMMUNITY): Payer: Medicare Other | Admitting: Anesthesiology

## 2021-07-02 ENCOUNTER — Other Ambulatory Visit: Payer: Self-pay

## 2021-07-02 ENCOUNTER — Encounter (HOSPITAL_COMMUNITY): Payer: Self-pay | Admitting: Certified Registered"

## 2021-07-02 ENCOUNTER — Encounter (HOSPITAL_COMMUNITY)
Admission: EM | Disposition: A | Discharge status: Home / Self Care | Payer: Self-pay | Source: Home / Self Care | Attending: Family Medicine

## 2021-07-02 DIAGNOSIS — K56609 Unspecified intestinal obstruction, unspecified as to partial versus complete obstruction: Secondary | ICD-10-CM | POA: Diagnosis not present

## 2021-07-02 DIAGNOSIS — F418 Other specified anxiety disorders: Secondary | ICD-10-CM

## 2021-07-02 DIAGNOSIS — M1990 Unspecified osteoarthritis, unspecified site: Secondary | ICD-10-CM

## 2021-07-02 DIAGNOSIS — D649 Anemia, unspecified: Secondary | ICD-10-CM

## 2021-07-02 HISTORY — PX: LYSIS OF ADHESION: SHX5961

## 2021-07-02 HISTORY — PX: LAPAROTOMY: SHX154

## 2021-07-02 HISTORY — PX: BOWEL RESECTION: SHX1257

## 2021-07-02 LAB — SURGICAL PCR SCREEN
MRSA, PCR: NEGATIVE
Staphylococcus aureus: NEGATIVE

## 2021-07-02 LAB — CBC
HCT: 37.6 % — ABNORMAL LOW (ref 39.0–52.0)
Hemoglobin: 11.8 g/dL — ABNORMAL LOW (ref 13.0–17.0)
MCH: 25.2 pg — ABNORMAL LOW (ref 26.0–34.0)
MCHC: 31.4 g/dL (ref 30.0–36.0)
MCV: 80.3 fL (ref 80.0–100.0)
Platelets: 317 10*3/uL (ref 150–400)
RBC: 4.68 MIL/uL (ref 4.22–5.81)
RDW: 13.8 % (ref 11.5–15.5)
WBC: 5.9 10*3/uL (ref 4.0–10.5)
nRBC: 0 % (ref 0.0–0.2)

## 2021-07-02 LAB — BASIC METABOLIC PANEL
Anion gap: 6 (ref 5–15)
BUN: <5 mg/dL — ABNORMAL LOW (ref 6–20)
CO2: 30 mmol/L (ref 22–32)
Calcium: 8.9 mg/dL (ref 8.9–10.3)
Chloride: 101 mmol/L (ref 98–111)
Creatinine, Ser: 0.54 mg/dL — ABNORMAL LOW (ref 0.61–1.24)
GFR, Estimated: >60 mL/min (ref >60)
Glucose, Bld: 103 mg/dL — ABNORMAL HIGH (ref 70–99)
Potassium: 4.2 mmol/L (ref 3.5–5.1)
Sodium: 137 mmol/L (ref 135–145)

## 2021-07-02 LAB — GLUCOSE, CAPILLARY: Glucose-Capillary: 171 mg/dL — ABNORMAL HIGH (ref 70–99)

## 2021-07-02 LAB — MAGNESIUM: Magnesium: 1.9 mg/dL (ref 1.7–2.4)

## 2021-07-02 SURGERY — LAPAROTOMY, EXPLORATORY
Anesthesia: General | Site: Abdomen

## 2021-07-02 MED ORDER — ORAL CARE MOUTH RINSE: 15.0000 mL | Freq: Once | OROMUCOSAL | Status: AC

## 2021-07-02 MED ORDER — ACETAMINOPHEN 500 MG PO TABS
1000.0000 mg | ORAL_TABLET | Freq: Four times a day (QID) | ORAL | Status: DC
  Administered 2021-07-02 – 2021-07-03 (×5): 1000 mg via ORAL
  Filled 2021-07-02 (×7): qty 2

## 2021-07-02 MED ORDER — CHLORHEXIDINE GLUCONATE CLOTH 2 % EX PADS
6.0000 | MEDICATED_PAD | Freq: Every day | CUTANEOUS | Status: DC
  Administered 2021-07-02 – 2021-07-08 (×7): 6 via TOPICAL

## 2021-07-02 MED ORDER — LIDOCAINE 2% (20 MG/ML) 5 ML SYRINGE
INTRAMUSCULAR | Status: DC | PRN
  Administered 2021-07-02: 80 mg via INTRAVENOUS

## 2021-07-02 MED ORDER — SODIUM CHLORIDE 0.9 % IR SOLN
Status: DC | PRN
  Administered 2021-07-02 (×3): 1000 mL

## 2021-07-02 MED ORDER — BUPIVACAINE LIPOSOME 1.3 % IJ SUSP
INTRAMUSCULAR | Status: DC | PRN
  Administered 2021-07-02: 20 mL

## 2021-07-02 MED ORDER — SUCCINYLCHOLINE CHLORIDE 200 MG/10ML IV SOSY
PREFILLED_SYRINGE | INTRAVENOUS | Status: AC
  Filled 2021-07-02: qty 10

## 2021-07-02 MED ORDER — HYDROMORPHONE HCL 1 MG/ML IJ SOLN
0.5000 mg | INTRAMUSCULAR | Status: DC | PRN
  Administered 2021-07-02: 0.5 mg via INTRAVENOUS
  Filled 2021-07-02: qty 0.5

## 2021-07-02 MED ORDER — ONDANSETRON HCL 4 MG/2ML IJ SOLN
INTRAMUSCULAR | Status: DC | PRN
  Administered 2021-07-02: 4 mg via INTRAVENOUS

## 2021-07-02 MED ORDER — FENTANYL CITRATE (PF) 250 MCG/5ML IJ SOLN
INTRAMUSCULAR | Status: DC | PRN
  Administered 2021-07-02 (×6): 50 ug via INTRAVENOUS
  Administered 2021-07-02: 100 ug via INTRAVENOUS

## 2021-07-02 MED ORDER — METRONIDAZOLE 500 MG/100ML IV SOLN
INTRAVENOUS | Status: AC
  Administered 2021-07-02: 500 mg via INTRAVENOUS
  Filled 2021-07-02: qty 100

## 2021-07-02 MED ORDER — ONDANSETRON HCL 4 MG/2ML IJ SOLN
INTRAMUSCULAR | Status: AC
  Filled 2021-07-02: qty 2

## 2021-07-02 MED ORDER — METHOCARBAMOL 1000 MG/10ML IJ SOLN
500.0000 mg | Freq: Three times a day (TID) | INTRAVENOUS | Status: DC
  Administered 2021-07-02 – 2021-07-04 (×6): 500 mg via INTRAVENOUS
  Filled 2021-07-02 (×3): qty 5
  Filled 2021-07-02: qty 500

## 2021-07-02 MED ORDER — DEXMEDETOMIDINE HCL IN NACL 80 MCG/20ML IV SOLN
INTRAVENOUS | Status: AC
  Filled 2021-07-02: qty 20

## 2021-07-02 MED ORDER — BUPIVACAINE LIPOSOME 1.3 % IJ SUSP
INTRAMUSCULAR | Status: AC
  Filled 2021-07-02: qty 20

## 2021-07-02 MED ORDER — METRONIDAZOLE 500 MG/100ML IV SOLN: 500.0000 mg | Freq: Once | INTRAVENOUS | Status: AC

## 2021-07-02 MED ORDER — DEXAMETHASONE SODIUM PHOSPHATE 4 MG/ML IJ SOLN
INTRAMUSCULAR | Status: DC | PRN
  Administered 2021-07-02: 6 mg via INTRAVENOUS

## 2021-07-02 MED ORDER — KETAMINE HCL 50 MG/5ML IJ SOSY
PREFILLED_SYRINGE | INTRAMUSCULAR | Status: AC
  Filled 2021-07-02: qty 5

## 2021-07-02 MED ORDER — PROPOFOL 10 MG/ML IV BOLUS
INTRAVENOUS | Status: DC | PRN
  Administered 2021-07-02: 170 mg via INTRAVENOUS

## 2021-07-02 MED ORDER — HYDROMORPHONE HCL 1 MG/ML IJ SOLN
1.0000 mg | INTRAMUSCULAR | Status: DC | PRN
  Administered 2021-07-02: 0.5 mg via INTRAVENOUS
  Administered 2021-07-02 – 2021-07-04 (×4): 1 mg via INTRAVENOUS
  Filled 2021-07-02 (×5): qty 1

## 2021-07-02 MED ORDER — LACTATED RINGERS IV SOLN: INTRAVENOUS | Status: DC

## 2021-07-02 MED ORDER — MEPERIDINE HCL 50 MG/ML IJ SOLN: 6.2500 mg | INTRAMUSCULAR | Status: DC | PRN

## 2021-07-02 MED ORDER — HYDROMORPHONE HCL 1 MG/ML IJ SOLN
0.2500 mg | INTRAMUSCULAR | Status: DC | PRN
  Administered 2021-07-02 (×3): 0.5 mg via INTRAVENOUS
  Filled 2021-07-02 (×2): qty 0.5

## 2021-07-02 MED ORDER — SUGAMMADEX SODIUM 200 MG/2ML IV SOLN
INTRAVENOUS | Status: DC | PRN
  Administered 2021-07-02: 200 mg via INTRAVENOUS

## 2021-07-02 MED ORDER — DEXMEDETOMIDINE (PRECEDEX) IN NS 20 MCG/5ML (4 MCG/ML) IV SYRINGE
PREFILLED_SYRINGE | INTRAVENOUS | Status: DC | PRN
  Administered 2021-07-02: 8 ug via INTRAVENOUS
  Administered 2021-07-02: 12 ug via INTRAVENOUS

## 2021-07-02 MED ORDER — ROCURONIUM BROMIDE 10 MG/ML (PF) SYRINGE
PREFILLED_SYRINGE | INTRAVENOUS | Status: DC | PRN
  Administered 2021-07-02: 10 mg via INTRAVENOUS
  Administered 2021-07-02: 90 mg via INTRAVENOUS

## 2021-07-02 MED ORDER — MIDAZOLAM HCL 2 MG/2ML IJ SOLN
INTRAMUSCULAR | Status: DC | PRN
  Administered 2021-07-02: 2 mg via INTRAVENOUS

## 2021-07-02 MED ORDER — ROCURONIUM BROMIDE 10 MG/ML (PF) SYRINGE
PREFILLED_SYRINGE | INTRAVENOUS | Status: AC
  Filled 2021-07-02: qty 10

## 2021-07-02 MED ORDER — PHENYLEPHRINE HCL-NACL 20-0.9 MG/250ML-% IV SOLN
INTRAVENOUS | Status: AC
  Filled 2021-07-02: qty 250

## 2021-07-02 MED ORDER — PHENYLEPHRINE 80 MCG/ML (10ML) SYRINGE FOR IV PUSH (FOR BLOOD PRESSURE SUPPORT)
PREFILLED_SYRINGE | INTRAVENOUS | Status: DC | PRN
Start: 2021-07-02 — End: 2021-07-02

## 2021-07-02 MED ORDER — PHENYLEPHRINE 80 MCG/ML (10ML) SYRINGE FOR IV PUSH (FOR BLOOD PRESSURE SUPPORT)
PREFILLED_SYRINGE | INTRAVENOUS | Status: DC | PRN
  Administered 2021-07-02: 80 ug via INTRAVENOUS
  Administered 2021-07-02: 160 ug via INTRAVENOUS

## 2021-07-02 MED ORDER — DEXAMETHASONE SODIUM PHOSPHATE 10 MG/ML IJ SOLN
INTRAMUSCULAR | Status: AC
  Filled 2021-07-02: qty 1

## 2021-07-02 MED ORDER — PROPOFOL 10 MG/ML IV BOLUS
INTRAVENOUS | Status: AC
  Filled 2021-07-02: qty 20

## 2021-07-02 MED ORDER — LIDOCAINE HCL (PF) 2 % IJ SOLN
INTRAMUSCULAR | Status: AC
  Filled 2021-07-02: qty 5

## 2021-07-02 MED ORDER — MIDAZOLAM HCL 2 MG/2ML IJ SOLN
INTRAMUSCULAR | Status: AC
  Filled 2021-07-02: qty 2

## 2021-07-02 MED ORDER — KETAMINE HCL 10 MG/ML IJ SOLN
INTRAMUSCULAR | Status: DC | PRN
  Administered 2021-07-02: 20 mg via INTRAVENOUS
  Administered 2021-07-02: 30 mg via INTRAVENOUS

## 2021-07-02 MED ORDER — CHLORHEXIDINE GLUCONATE 0.12 % MT SOLN
15.0000 mL | Freq: Once | OROMUCOSAL | Status: AC
  Administered 2021-07-02: 15 mL via OROMUCOSAL

## 2021-07-02 MED ORDER — FENTANYL CITRATE (PF) 250 MCG/5ML IJ SOLN
INTRAMUSCULAR | Status: AC
  Filled 2021-07-02: qty 5

## 2021-07-02 MED ORDER — HYDROMORPHONE HCL 1 MG/ML IJ SOLN
0.2500 mg | INTRAMUSCULAR | Status: AC | PRN
  Administered 2021-07-02 (×4): 0.5 mg via INTRAVENOUS
  Filled 2021-07-02 (×3): qty 0.5

## 2021-07-02 SURGICAL SUPPLY — 52 items
APL PRP STRL LF DISP 70% ISPRP (MISCELLANEOUS) ×1
APL SWBSTK 6 STRL LF DISP (MISCELLANEOUS) ×1
APPLICATOR COTTON TIP 6 STRL (MISCELLANEOUS) IMPLANT
APPLICATOR COTTON TIP 6IN STRL (MISCELLANEOUS) ×2 IMPLANT
CHLORAPREP W/TINT 26 (MISCELLANEOUS) ×3 IMPLANT
CLOTH BEACON ORANGE TIMEOUT ST (SAFETY) ×3 IMPLANT
COVER LIGHT HANDLE STERIS (MISCELLANEOUS) ×6 IMPLANT
DRAPE WARM FLUID 44X44 (DRAPES) ×3 IMPLANT
DRSG OPSITE POSTOP 4X10 (GAUZE/BANDAGES/DRESSINGS) ×1 IMPLANT
ELECT BLADE 6 FLAT ULTRCLN (ELECTRODE) ×1 IMPLANT
ELECT REM PT RETURN 9FT ADLT (ELECTROSURGICAL) ×2
ELECTRODE REM PT RTRN 9FT ADLT (ELECTROSURGICAL) ×2 IMPLANT
GLOVE BIO SURGEON STRL SZ7 (GLOVE) ×1 IMPLANT
GLOVE BIOGEL PI IND STRL 6.5 (GLOVE) ×2 IMPLANT
GLOVE BIOGEL PI IND STRL 7.0 (GLOVE) ×4 IMPLANT
GLOVE BIOGEL PI INDICATOR 6.5 (GLOVE) ×2
GLOVE BIOGEL PI INDICATOR 7.0 (GLOVE) ×4
GLOVE SURG SS PI 6.5 STRL IVOR (GLOVE) ×9 IMPLANT
GLOVE SURG SS PI 7.0 STRL IVOR (GLOVE) ×3 IMPLANT
GOWN STRL REUS W/TWL LRG LVL3 (GOWN DISPOSABLE) ×11 IMPLANT
HANDLE SUCTION POOLE (INSTRUMENTS) ×2 IMPLANT
INST SET MAJOR GENERAL (KITS) ×3 IMPLANT
KIT TURNOVER KIT A (KITS) ×3 IMPLANT
LIGASURE IMPACT 36 18CM CVD LR (INSTRUMENTS) ×1 IMPLANT
MANIFOLD NEPTUNE II (INSTRUMENTS) ×3 IMPLANT
NDL HYPO 21X1.5 SAFETY (NEEDLE) ×2 IMPLANT
NEEDLE HYPO 21X1.5 SAFETY (NEEDLE) ×2 IMPLANT
NS IRRIG 1000ML POUR BTL (IV SOLUTION) ×7 IMPLANT
PACK MAJOR ABDOMINAL (CUSTOM PROCEDURE TRAY) ×3 IMPLANT
PAD ARMBOARD 7.5X6 YLW CONV (MISCELLANEOUS) ×3 IMPLANT
PENCIL SMOKE EVACUATOR (MISCELLANEOUS) ×1 IMPLANT
RELOAD PROXIMATE 75MM BLUE (ENDOMECHANICALS) ×4 IMPLANT
RELOAD STAPLE 75 3.8 BLU REG (ENDOMECHANICALS) IMPLANT
RETRACTOR WND ALEXIS-O 25 LRG (MISCELLANEOUS) IMPLANT
RTRCTR WOUND ALEXIS O 25CM LRG (MISCELLANEOUS) ×2
SET BASIN LINEN APH (SET/KITS/TRAYS/PACK) ×3 IMPLANT
SPONGE T-LAP 18X18 ~~LOC~~+RFID (SPONGE) ×8 IMPLANT
STAPLER GUN LINEAR PROX 60 (STAPLE) ×1 IMPLANT
STAPLER PROXIMATE 75MM BLUE (STAPLE) ×1 IMPLANT
STAPLER VISISTAT (STAPLE) ×4 IMPLANT
SUCTION POOLE HANDLE (INSTRUMENTS) ×2
SURGILUBE 2OZ TUBE FLIPTOP (MISCELLANEOUS) ×1 IMPLANT
SUT CHROMIC 2 0 SH (SUTURE) ×2 IMPLANT
SUT PDS AB CT VIOLET #0 27IN (SUTURE) ×6 IMPLANT
SUT SILK 2 0 (SUTURE) ×2
SUT SILK 2-0 18XBRD TIE 12 (SUTURE) IMPLANT
SUT SILK 3 0 SH CR/8 (SUTURE) ×5 IMPLANT
SUT VIC AB 2-0 CT2 27 (SUTURE) ×1 IMPLANT
SUT VIC AB 3-0 SH 27 (SUTURE) ×2
SUT VIC AB 3-0 SH 27X BRD (SUTURE) IMPLANT
SYR 20ML LL LF (SYRINGE) ×5 IMPLANT
TRAY FOLEY MTR SLVR 16FR STAT (SET/KITS/TRAYS/PACK) ×3 IMPLANT

## 2021-07-02 NOTE — Anesthesia Procedure Notes (Addendum)
Procedure Name: Intubation ?Date/Time: 07/02/2021 7:42 AM ?Performed by: Julian Reil, CRNA ?Pre-anesthesia Checklist: Patient identified, Emergency Drugs available, Suction available and Patient being monitored ?Patient Re-evaluated:Patient Re-evaluated prior to induction ?Oxygen Delivery Method: Circle system utilized ?Preoxygenation: Pre-oxygenation with 100% oxygen ?Induction Type: IV induction, Rapid sequence and Cricoid Pressure applied ?Laryngoscope Size: Glidescope and 3 ?Grade View: Grade I ?Tube type: Oral ?Tube size: 7.5 mm ?Number of attempts: 1 ?Airway Equipment and Method: Rigid stylet and Video-laryngoscopy ?Placement Confirmation: ETT inserted through vocal cords under direct vision, positive ETCO2 and breath sounds checked- equal and bilateral ?Secured at: 22 cm ?Tube secured with: Tape ?Dental Injury: Teeth and Oropharynx as per pre-operative assessment  ?Comments: Glidescope used for RSI.  4x4s bite block used. ? ? ? ? ?

## 2021-07-02 NOTE — Anesthesia Preprocedure Evaluation (Signed)
Anesthesia Evaluation  ?Patient identified by MRN, date of birth, ID band ?Patient awake ? ? ? ?Reviewed: ?Allergy & Precautions, NPO status , Patient's Chart, lab work & pertinent test results ? ?Airway ?Mallampati: II ? ?TM Distance: >3 FB ?Neck ROM: Full ? ? ? Dental ? ?(+) Dental Advisory Given, Missing ?  ?Pulmonary ?neg pulmonary ROS, former smoker,  ?  ?Pulmonary exam normal ?breath sounds clear to auscultation ? ? ? ? ? ? Cardiovascular ?Exercise Tolerance: Poor ?METS: Normal cardiovascular exam ?Rhythm:Regular Rate:Normal ? ? ?  ?Neuro/Psych ?PSYCHIATRIC DISORDERS Anxiety Depression  Neuromuscular disease (paraplegia)   ? GI/Hepatic ?GERD  Medicated,(+)  ?  ? substance abuse ? alcohol use, Hepatitis -, C  ?Endo/Other  ?negative endocrine ROS ? Renal/GU ?negative Renal ROS  ?negative genitourinary ?  ?Musculoskeletal ? ?(+) Arthritis ,  ? Abdominal ?  ?Peds ?negative pediatric ROS ?(+)  Hematology ? ?(+) Blood dyscrasia, anemia ,   ?Anesthesia Other Findings ? ? Reproductive/Obstetrics ?negative OB ROS ? ?  ? ? ? ? ? ? ? ? ? ? ? ? ? ?  ?  ? ? ? ? ? ? ? ?Anesthesia Physical ?Anesthesia Plan ? ?ASA: 3 ? ?Anesthesia Plan: General  ? ?Post-op Pain Management: Dilaudid IV  ? ?Induction: Intravenous, Rapid sequence and Cricoid pressure planned ? ?PONV Risk Score and Plan: 4 or greater and Ondansetron, Dexamethasone and Midazolam ? ?Airway Management Planned: Oral ETT ? ?Additional Equipment:  ? ?Intra-op Plan:  ? ?Post-operative Plan: Extubation in OR ? ?Informed Consent: I have reviewed the patients History and Physical, chart, labs and discussed the procedure including the risks, benefits and alternatives for the proposed anesthesia with the patient or authorized representative who has indicated his/her understanding and acceptance.  ? ? ? ?Dental advisory given ? ?Plan Discussed with: CRNA and Surgeon ? ?Anesthesia Plan Comments:   ? ? ? ? ? ?Anesthesia Quick Evaluation ? ?

## 2021-07-02 NOTE — Interval H&P Note (Signed)
History and Physical Interval Note: ? ?07/02/2021 ?7:19 AM ? ?[name]  has presented today for surgery, with the diagnosis of small bowel obstruction.  The various methods of treatment have been discussed with the patient and family. After consideration of risks, benefits and other options for treatment, the patient has consented to  Procedure(s): ?EXPLORATORY LAPAROTOMY, possible small bowel resection (N/A) as a surgical intervention.  The patient's history has been reviewed, patient examined, no change in status, stable for surgery.  I have reviewed the patient's chart and labs.  Questions were answered to the patient's satisfaction.   ? ? ?[name] ? ? ?

## 2021-07-02 NOTE — Progress Notes (Signed)
Has been in pain since surgery despite scheduled and prn pain medication.  Contacted Dr. Laural Benes and Dr Robyne Peers and Dr. Laural Benes increased  dilaudid from 0.5 mg Q4 prn to 1 mg Q3 prn.  NG tube output has been 75 since returning from pacu has had no c/o nausea. Dressing still dry and intact with small amount of old drainage ?

## 2021-07-02 NOTE — Progress Notes (Signed)
Returned from abd surgery around 1330 and honeycomb dressing dry and intact.  NG tube in place.  Gave scheduled pain medication orally and robaxin IV  hung and infusing.  NG tube suction turned off for and hour.   ?

## 2021-07-02 NOTE — Anesthesia Postprocedure Evaluation (Signed)
Anesthesia Post Note ? ?Patient: [name] ? ?Procedure(s) Performed: EXPLORATORY LAPAROTOMY (Abdomen) ?SMALL BOWEL RESECTION (Abdomen) ?EXTENSIVE LYSIS OF ADHESIONS (Abdomen) ? ?Patient location during evaluation: Phase II ?Anesthesia Type: General ?Level of consciousness: awake and alert and oriented ?Pain management: pain level controlled ?Vital Signs Assessment: post-procedure vital signs reviewed and stable ?Respiratory status: spontaneous breathing, nonlabored ventilation and respiratory function stable ?Cardiovascular status: blood pressure returned to baseline and stable ?Postop Assessment: no apparent nausea or vomiting ?Anesthetic complications: no ? ? ?No notable events documented. ? ? ?Last Vitals:  ?Vitals:  ? 07/02/21 1245 07/02/21 1333  ?BP: (!) 152/92 (!) 143/93  ?Pulse: 92 100  ?Resp: (!) 24 20  ?Temp: 36.7 ?C 36.6 ?C  ?SpO2: 100% 99%  ?  ?Last Pain:  ?Vitals:  ? 07/02/21 1333  ?TempSrc: Oral  ?PainSc:   ? ? ?  ?  ?  ?  ?  ?  ? ?Monquie Fulgham C Natalia Wittmeyer ? ? ? ? ?
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
## 2021-07-02 NOTE — Op Note (Signed)
[facility] ?Operative Note ? ?07/02/21 ? ?Preoperative Diagnosis: Small bowel obstruction ?  ?Postoperative Diagnosis: Same ?  ?Procedure(s) Performed: Exploratory laparotomy, extensive lysis of adhesions (>60 minutes), small bowel resection ?  ?Surgeon: Theophilus Kinds, DO  ?  ?Assistants: Cecile Sheerer, RN; Leonie Green MS3 ?  ?Anesthesia: General endotracheal ?  ?Anesthesiologist: Molli Barrows, MD  ?  ?Specimens: Small bowel resection ?  ?Estimated Blood Loss: 75 cc ?  ?Blood Replacement: None  ?  ?Complications: None  ? ?Wound Class: Clean contaminated  ?  ?Operative Indications: Patient is a 61 year old male who presented to the hospital with abdominal pain, nausea, and vomiting.  CT abdomen and pelvis demonstrated dilated loops of small bowel with transition point in right side of abdomen, concerning for intermediate small bowel obstruction.  Attempts were made at conservative management with NG tube, NPO, and IVF.  He underwent a small bowel obstruction protocol, which was demonstrating slow progression of contrast through small bowel, and it was difficult to assess if there was any contrast within the colon. A repeat CT abdomen and pelvis was performed to determine the location of the contrast, which again demonstrated similar findings of small bowel obstruction with dilated loops of bowel and transition point again in the right side of the abdomen with air in the colon, concerning for incomplete bowel obstruction.  Given that the patient had not had significant increase in bowel movements, decision was made to offer this patient surgery for his bowel obstruction. ? ?All risks, benefits, and alternatives to exploratory laparotomy, lysis of adhesions, and possible small bowel resection were discussed with the patient, all of his questions were answered to his expressed satisfaction. The patient expresses he wishes to proceed, and informed consent was obtained. ? ? ?Findings:  Extensive adhesions in the right upper quadrant requiring lysis of adhesions for >60 minutes.  Small bowel resection performed secondary to 3-4 identified enterotomies in the previously adhesed small bowel ?  ?Procedure: The patient was taken to the operating room and placed supine. General endotracheal anesthesia was induced. Intravenous antibiotics were administered per protocol.  An nasograstic tube was already in place. The abdomen was prepared and draped in the usual sterile fashion.  Timeout was performed. ? ?A vertical midline incision was made from just below the xiphoid to just below the umbilicus.  This was deepened through the subcutaneous tissues, and hemostasis was achieved with electrocautery.  The linea alba was identified and incised in a region below the area of likely scar tissue, and the peritoneal cavity was entered with care.  Upon entering the abdominal cavity, there were extensive adhesions of the omentum and bowel to the underside of his previous right subcostal incision site.  Extensive lysis of adhesions was performed for greater than 60 minutes to the underside of the abdominal wall using a combination of sharp dissection with Metzenbaum scissors and electrocautery.  The small bowel was also adhesed to the colon and the liver, and these adhesions were also taken down.  During the lysis of adhesions, 3 small enterotomies were created and identified.  The small bowel was run from the ligament of Treitz to the ileocecal valve.  The bowel was noted to be dilated proximally and collapsed distally with a zone of transition from the area of adhesions in the right side of the abdomen.  All adhesions were divided, and all small bowel loops were carefully separated.  The entire small bowel was then inspected for viability.  Given the  multiple identified enterotomies, decision was made to resect the area of small bowel that was adhesed to decrease the risk of potential fistula formation in the future  (46 cm total).  A window was created using hemostat to separate the mesentery from the bowel at each resection margin.  The bowel was divided with a GIA linear cutting stapler at each resection margin.  The small bowel was passed off the table as a specimen to be sent to pathology.  Enterotomies were created at the antimesenteric angles of both the proximal and distal segments, and a GIA linear cutting stapler was inserted and fired.  The lumen was inspected for hemostasis.  The enterotomy was closed with a TIA stapler.  The staple site was then imbricated with 3-0 silk, and crotch stitches were also placed.  The mesenteric defect was closed with 2-0 chromic suture in a running locking fashion.  The NG tube was palpated, and noted to be in appropriate position within the stomach.  The small bowel contents were then milked proximally to be aspirated by the NG tube.  The abdominal cavity was copiously irrigated with warm saline, and hemostasis was obtained.  At this time, all participants in the case changed their outer gloves given the need for small bowel resection during the case.  The fascia was closed with a running 1-0 PDS from both the superior and inferior aspects of the incision.  The subcutaneous tissue was again irrigated with warm saline.  The skin was closed with skin staples. ? ?Final inspection revealed acceptable hemostasis. All counts were correct at the end of the case. The patient was awakened from anesthesia and extubated without complication.  The patient went to the PACU in stable condition. ?  ?Theophilus Kinds, DO  ?[facility] ?[address] ?[place], [place] [address] ?[phone] (office) ? ? ?

## 2021-07-02 NOTE — Transfer of Care (Signed)
Immediate Anesthesia Transfer of Care Note ? ?Patient: [name] ? ?Procedure(s) Performed: EXPLORATORY LAPAROTOMY (Abdomen) ?SMALL BOWEL RESECTION (Abdomen) ?EXTENSIVE LYSIS OF ADHESIONS (Abdomen) ? ?Patient Location: PACU ? ?Anesthesia Type:General ? ?Level of Consciousness: awake and alert  ? ?Airway & Oxygen Therapy: Patient Spontanous Breathing and Patient connected to face mask oxygen ? ?Post-op Assessment: Report given to RN and Post -op Vital signs reviewed and stable ? ?Post vital signs: Reviewed and stable ? ?Last Vitals:  ?Vitals Value Taken Time  ?BP 158/99 07/02/21 1041  ?Temp    ?Pulse    ?Resp 15 07/02/21 1041  ?SpO2    ? ? ?Last Pain:  ?Vitals:  ? 07/01/21 2013  ?TempSrc: Oral  ?PainSc: 10-Worst pain ever  ?   ? ?Patients Stated Pain Goal: 2 (06/28/21 1942) ? ?Complications: No notable events documented. ?

## 2021-07-02 NOTE — Brief Op Note (Signed)
07/02/2021 ? ?10:37 AM ? ?PATIENT:  Daymion Nazaire  61 y.o. male ? ?PRE-OPERATIVE DIAGNOSIS:  small bowel obstruction ? ?POST-OPERATIVE DIAGNOSIS:  small bowel obstruction ? ?PROCEDURE:  Procedure(s): ?EXPLORATORY LAPAROTOMY (N/A) ?SMALL BOWEL RESECTION (N/A) ?EXTENSIVE LYSIS OF ADHESIONS (N/A) ? ?SURGEON:  Surgeon(s) and Role: ?   * Henlee Donovan A, DO - Primary ? ?ASSISTANTS: Cecile Sheerer, RN  ? ?ANESTHESIA:   general ? ?EBL:  75 mL  ? ?BLOOD ADMINISTERED:none ? ?DRAINS: none  ? ?LOCAL MEDICATIONS USED:  Exparel, 20 cc ? ?SPECIMEN:  Source of Specimen:  small bowel resection ? ?DISPOSITION OF SPECIMEN:  PATHOLOGY ? ?COUNTS:  YES ? ?DICTATION: .Note written in EPIC ? ?PLAN OF CARE: Admit to inpatient  ? ?PATIENT DISPOSITION:  PACU - hemodynamically stable. ? ?

## 2021-07-02 NOTE — Progress Notes (Signed)
Update Note: ? ?The patient has no one that he would like be to update after surgery.  I explained to the patient that he had extensive adhesions in the right side of his abdomen, but it required bowel resection once the adhesions were taken down secondary to enterotomies noted after lysis of adhesions.  He has NG tube in place and foley in place. ? ?Plan: ?-Maintain NG tube to LIS ?-NPO ?-IVF ?-Maintain foley ?-Pain control with scheduled Tylenol, Toradol, Methadone, and Robaxin; PRN Dilaudid ?-Dulcolax suppositories ?-Await return of bowel function ?-No oral laxatives at this time given need for small bowel resection ? ?Brittinie Wherley, DO ?[facility] ?[place], [place] [address] ?[phone] (office) ? ?

## 2021-07-03 DIAGNOSIS — K56609 Unspecified intestinal obstruction, unspecified as to partial versus complete obstruction: Secondary | ICD-10-CM | POA: Diagnosis not present

## 2021-07-03 LAB — BASIC METABOLIC PANEL
Anion gap: 7 (ref 5–15)
BUN: 6 mg/dL (ref 6–20)
CO2: 27 mmol/L (ref 22–32)
Calcium: 8.5 mg/dL — ABNORMAL LOW (ref 8.9–10.3)
Chloride: 101 mmol/L (ref 98–111)
Creatinine, Ser: 0.49 mg/dL — ABNORMAL LOW (ref 0.61–1.24)
GFR, Estimated: >60 mL/min (ref >60)
Glucose, Bld: 135 mg/dL — ABNORMAL HIGH (ref 70–99)
Potassium: 4 mmol/L (ref 3.5–5.1)
Sodium: 135 mmol/L (ref 135–145)

## 2021-07-03 LAB — CBC
HCT: 38.5 % — ABNORMAL LOW (ref 39.0–52.0)
Hemoglobin: 12.5 g/dL — ABNORMAL LOW (ref 13.0–17.0)
MCH: 25.6 pg — ABNORMAL LOW (ref 26.0–34.0)
MCHC: 32.5 g/dL (ref 30.0–36.0)
MCV: 78.7 fL — ABNORMAL LOW (ref 80.0–100.0)
Platelets: 343 10*3/uL (ref 150–400)
RBC: 4.89 MIL/uL (ref 4.22–5.81)
RDW: 14.3 % (ref 11.5–15.5)
WBC: 11.5 10*3/uL — ABNORMAL HIGH (ref 4.0–10.5)
nRBC: 0 % (ref 0.0–0.2)

## 2021-07-03 LAB — MAGNESIUM: Magnesium: 1.7 mg/dL (ref 1.7–2.4)

## 2021-07-03 LAB — GLUCOSE, CAPILLARY
Glucose-Capillary: 102 mg/dL — ABNORMAL HIGH (ref 70–99)
Glucose-Capillary: 114 mg/dL — ABNORMAL HIGH (ref 70–99)
Glucose-Capillary: 85 mg/dL (ref 70–99)

## 2021-07-03 MED ORDER — METHOCARBAMOL 1000 MG/10ML IJ SOLN
INTRAMUSCULAR | Status: AC
  Filled 2021-07-03: qty 10

## 2021-07-03 MED ORDER — METHOCARBAMOL 1000 MG/10ML IJ SOLN
INTRAMUSCULAR | Status: AC
Start: 2021-07-03 — End: ?
  Filled 2021-07-03: qty 10

## 2021-07-03 MED ORDER — SODIUM CHLORIDE 0.9 % IV SOLN
INTRAVENOUS | Status: DC | PRN
Start: 2021-07-03 — End: 2021-07-08

## 2021-07-03 MED ORDER — KETOROLAC TROMETHAMINE 30 MG/ML IJ SOLN
30.0000 mg | Freq: Four times a day (QID) | INTRAMUSCULAR | Status: DC
  Administered 2021-07-03 – 2021-07-07 (×15): 30 mg via INTRAVENOUS
  Filled 2021-07-03 (×16): qty 1

## 2021-07-03 MED ORDER — KETOROLAC TROMETHAMINE 30 MG/ML IJ SOLN: 30.0000 mg | Freq: Four times a day (QID) | INTRAMUSCULAR | Status: DC
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
## 2021-07-03 NOTE — Progress Notes (Signed)
NG ouput an additional 100 mls, green.  Stated he heard himself pass gas and that he is so hungry and wants to eat.   ?

## 2021-07-03 NOTE — Progress Notes (Addendum)
Has slept some today and received only scheduled pain medications but rates pain at a 9 or 10 when asked.  Denies nausea. NG tube output green and slightly over 200 so far today.  Allowed nurse tech to help turn to lie on left side.  Dressing changed to pressure wound right buttock. Honeycomb dressing to abdomen dry and intact.  Abd soft.  No bm after suppository this morning ?

## 2021-07-03 NOTE — Progress Notes (Signed)
[facility] Progress Note ? ?1 Day Post-Op  ?Subjective: ?Patient seen and examined.  He is resting comfortably in bed.  His pain is improving at this time.  He denies nausea and vomiting.  His NG tube remains in place with 900 cc of bilious output in the last 12 hours.  He denies any bowel movements since surgery. ? ?Objective: ?Vital signs in last 24 hours: ?Temp:  [97.8 ?F (36.6 ?C)-98.9 ?F (37.2 ?C)] 98.2 ?F (36.8 ?C) (04/28 0414) ?Pulse Rate:  [82-102] 96 (04/28 0414) ?Resp:  [15-31] 18 (04/28 0414) ?BP: (113-160)/(79-122) 124/81 (04/28 0414) ?SpO2:  [94 %-100 %] 100 % (04/28 0414) ?Last BM Date : 06/28/21 ? ?Intake/Output from previous day: ?04/27 0701 - 04/28 0700 ?In: 2531.9 [I.V.:2230.9; IV Piggyback:301] ?Out: 4850 [Urine:3800; Emesis/NG output:975; Blood:75] ?Intake/Output this shift: ?No intake/output data recorded. ? ?General appearance: alert, cooperative, and no distress ?Nose: Ng tube in place ?GI: abdomen soft, nondistended, no percussion tenderness, non tender to palpation; Midline incision C/D/I with skin staples and honeycomb dressing in place ? ?Lab Results:  ?Recent Labs  ?  07/02/21 ?0425 07/03/21 ?9767  ?WBC 5.9 11.5*  ?HGB 11.8* 12.5*  ?HCT 37.6* 38.5*  ?PLT 317 343  ? ?BMET ?Recent Labs  ?  07/02/21 ?0425 07/03/21 ?3419  ?NA 137 135  ?K 4.2 4.0  ?CL 101 101  ?CO2 30 27  ?GLUCOSE 103* 135*  ?BUN <5* 6  ?CREATININE 0.54* 0.49*  ?CALCIUM 8.9 8.5*  ? ?PT/INR ?No results for input(s): LABPROT, INR in the last 72 hours. ? ?Studies/Results: ?CT ABDOMEN PELVIS W CONTRAST ? ?Result Date: 07/01/2021 ?CLINICAL DATA:  Evaluate for small bowel obstruction EXAM: CT ABDOMEN AND PELVIS WITH CONTRAST TECHNIQUE: Multidetector CT imaging of the abdomen and pelvis was performed using the standard protocol following bolus administration of intravenous contrast. RADIATION DOSE REDUCTION: This exam was performed according to the departmental dose-optimization program which includes automated  exposure control, adjustment of the mA and/or kV according to patient size and/or use of iterative reconstruction technique. CONTRAST:  69mL OMNIPAQUE IOHEXOL 300 MG/ML  SOLN COMPARISON:  06/27/2021 FINDINGS: Lower chest: No acute abnormality. Hepatobiliary: No focal liver abnormality is seen. Status post cholecystectomy. No biliary dilatation. Minimal postoperative pneumobilia. Pancreas: Unremarkable. No pancreatic ductal dilatation or surrounding inflammatory changes. Spleen: Normal in size without significant abnormality. Adrenals/Urinary Tract: Adrenal glands are unremarkable. Kidneys are normal, without renal calculi, solid lesion, or hydronephrosis. Bladder is unremarkable. Stomach/Bowel: Stomach is within normal limits. Appendix appears normal. Diffusely distended mid to distal small bowel, largest loops measuring up to 4.7 cm in caliber, again with an abrupt caliber change in the right hemiabdomen and decompression of the distal ileum to the ileocecal valve (series 2, image 42, series 6, image 47). There is scattered gas and stool present in the colon to the rectum. Wall thickening and mucosal hyperenhancement of the rectum (series 2, image 63, 74). Vascular/Lymphatic: Aortic atherosclerosis. Infrarenal IVC filter. No enlarged abdominal or pelvic lymph nodes. Reproductive: No mass or other significant abnormality. Other: No abdominal wall hernia or abnormality. No ascites. Musculoskeletal: No acute osseous findings. Large decubitus ulceration of the right buttock, open to bone, with severe, chronic bony destruction of the right ischial ramus (series 2, image 81). IMPRESSION: 1. Diffusely distended mid to distal small bowel, largest loops measuring up to 4.7 cm in caliber, again with an abrupt caliber change in the right hemiabdomen and decompression of the distal ileum to the ileocecal valve. There is scattered gas and stool  present in the colon to the rectum. Findings are consistent with partial small bowel  obstruction. 2. Wall thickening and mucosal hyperenhancement of the rectum, consistent with nonspecific infectious, inflammatory, or ischemic proctitis. 3. Large decubitus ulceration of the right buttock, open to bone, with chronic osteomyelitis and bony destruction of the right ischial ramus. Aortic Atherosclerosis (ICD10-I70.0). Electronically Signed   By: Jearld Lesch M.D.   On: 07/01/2021 10:21   ? ?Anti-infectives: ?Anti-infectives (From admission, onward)  ? ? Start     Dose/Rate Route Frequency Ordered Stop  ? 07/02/21 0730  metroNIDAZOLE (FLAGYL) IVPB 500 mg       ? 500 mg ?100 mL/hr over 60 Minutes Intravenous  Once 07/02/21 0719 07/02/21 0824  ? 06/28/21 2000  doxycycline (VIBRAMYCIN) 100 mg in sodium chloride 0.9 % 250 mL IVPB       ? 100 mg ?125 mL/hr over 120 Minutes Intravenous Every 12 hours 06/28/21 1820    ? 06/28/21 1900  cefTRIAXone (ROCEPHIN) 2 g in sodium chloride 0.9 % 100 mL IVPB  Status:  Discontinued       ? 2 g ?200 mL/hr over 30 Minutes Intravenous Every 24 hours 06/28/21 1811 07/02/21 1310  ? ?  ? ? ?Assessment/Plan: ? ?Patient is a 61 year old male who was admitted with small bowel obstruction.  He underwent exploratory laparotomy with lysis of adhesions and small bowel resection on 4/27. ? ?-Mild leukocytosis this morning, 11.5 - likely reactive from surgery ?-NG with 900 cc of bilious output in last 12 hours ?-Maintain NG tube to LIS ?-Will maintain NG tube until return of bowel function given likely slow baseline transit and recent surgical intervention ?-Dulcolax suppositories ?-NPO ?-IVF ?-Pain control and antiemetics - schedule Tylenol, Toradol, Robaxin, and methadone; PRN dilaudid ?-Maintain foley while NG tube in place for accurate I's and O's ?-Dr. Henreitta Leber will be rounding for me over this weekend ?-Appreciate hospitalist recommendations ? ? ? LOS: 6 days  ? ? ?Carrye Goller A Jazzmon Prindle ?07/03/2021 ? ?

## 2021-07-04 ENCOUNTER — Inpatient Hospital Stay (HOSPITAL_COMMUNITY): Payer: Medicare Other

## 2021-07-04 LAB — BASIC METABOLIC PANEL
Anion gap: 6 (ref 5–15)
BUN: 9 mg/dL (ref 6–20)
CO2: 24 mmol/L (ref 22–32)
Calcium: 8.3 mg/dL — ABNORMAL LOW (ref 8.9–10.3)
Chloride: 107 mmol/L (ref 98–111)
Creatinine, Ser: 0.45 mg/dL — ABNORMAL LOW (ref 0.61–1.24)
GFR, Estimated: >60 mL/min (ref >60)
Glucose, Bld: 97 mg/dL (ref 70–99)
Potassium: 4.1 mmol/L (ref 3.5–5.1)
Sodium: 137 mmol/L (ref 135–145)

## 2021-07-04 LAB — CBC
HCT: 35.1 % — ABNORMAL LOW (ref 39.0–52.0)
Hemoglobin: 11.2 g/dL — ABNORMAL LOW (ref 13.0–17.0)
MCH: 25.7 pg — ABNORMAL LOW (ref 26.0–34.0)
MCHC: 31.9 g/dL (ref 30.0–36.0)
MCV: 80.7 fL (ref 80.0–100.0)
Platelets: 269 10*3/uL (ref 150–400)
RBC: 4.35 MIL/uL (ref 4.22–5.81)
RDW: 14.6 % (ref 11.5–15.5)
WBC: 9.7 10*3/uL (ref 4.0–10.5)
nRBC: 0 % (ref 0.0–0.2)

## 2021-07-04 LAB — DIFFERENTIAL
Abs Immature Granulocytes: 0.04 10*3/uL (ref 0.00–0.07)
Basophils Absolute: 0 10*3/uL (ref 0.0–0.1)
Basophils Relative: 0 %
Eosinophils Absolute: 0.2 10*3/uL (ref 0.0–0.5)
Eosinophils Relative: 2 %
Immature Granulocytes: 0 %
Lymphocytes Relative: 10 %
Lymphs Abs: 0.9 10*3/uL (ref 0.7–4.0)
Monocytes Absolute: 0.7 10*3/uL (ref 0.1–1.0)
Monocytes Relative: 8 %
Neutro Abs: 7.8 10*3/uL — ABNORMAL HIGH (ref 1.7–7.7)
Neutrophils Relative %: 80 %

## 2021-07-04 LAB — GLUCOSE, CAPILLARY
Glucose-Capillary: 81 mg/dL (ref 70–99)
Glucose-Capillary: 101 mg/dL — ABNORMAL HIGH (ref 70–99)

## 2021-07-04 LAB — MAGNESIUM: Magnesium: 1.6 mg/dL — ABNORMAL LOW (ref 1.7–2.4)

## 2021-07-04 MED ORDER — MAGNESIUM SULFATE 4 GM/100ML IV SOLN
4.0000 g | Freq: Once | INTRAVENOUS | Status: AC
  Administered 2021-07-04: 4 g via INTRAVENOUS
  Filled 2021-07-04: qty 100

## 2021-07-04 MED ORDER — METHOCARBAMOL 1000 MG/10ML IJ SOLN
500.0000 mg | Freq: Three times a day (TID) | INTRAVENOUS | Status: DC | PRN
  Filled 2021-07-04: qty 5

## 2021-07-04 MED ORDER — METHOCARBAMOL 1000 MG/10ML IJ SOLN
INTRAMUSCULAR | Status: AC
  Filled 2021-07-04: qty 10

## 2021-07-04 MED ORDER — ACETAMINOPHEN 160 MG/5ML PO SOLN
650.0000 mg | Freq: Four times a day (QID) | ORAL | Status: DC
  Administered 2021-07-04 – 2021-07-07 (×13): 650 mg via ORAL
  Filled 2021-07-04 (×16): qty 20.3

## 2021-07-04 NOTE — Progress Notes (Signed)
[facility] ? ?X-ray with better Bowel gas pattern and contrast in colon. will plan for sips and ng to gravity as he is still not had a bowel movement. ? ?Muscle relaxer change to PRN after speaking with the pharmacist. ? ?Algis Greenhouse MD ? ?
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
## 2021-07-04 NOTE — Progress Notes (Addendum)
[facility] Progress Note ? ?2 Days Post-Op  ?Subjective: ?? Possibly with flatus but unsure. No Bm with suppository. Otherwise says he is hungry.  ? ?Objective: ?Vital signs in last 24 hours: ?Temp:  [97.7 ?F (36.5 ?C)-98.6 ?F (37 ?C)] 98.6 ?F (37 ?C) (04/29 0432) ?Pulse Rate:  [82-86] 86 (04/29 0432) ?Resp:  [18-19] 18 (04/29 0432) ?BP: (124-141)/(74-89) 125/74 (04/29 0432) ?SpO2:  [100 %] 100 % (04/29 0432) ?Last BM Date : 06/28/21 ? ?Intake/Output from previous day: ?04/28 0701 - 04/29 0700 ?In: 3237.5 [I.V.:2737.4; IV Piggyback:500.1] ?Out: 1200 [Urine:900; Emesis/NG output:300] ?Intake/Output this shift: ?No intake/output data recorded. ? ?General appearance: alert and no distress ?GI: soft, nondistended, appropriately tender, midline honeycomb with no staining, staples c/d/I  ? ?Lab Results:  ?Recent Labs  ?  07/03/21 ?5916 07/04/21 ?0510  ?WBC 11.5* 9.7  ?HGB 12.5* 11.2*  ?HCT 38.5* 35.1*  ?PLT 343 269  ? ?BMET ?Recent Labs  ?  07/03/21 ?3846 07/04/21 ?0510  ?NA 135 137  ?K 4.0 4.1  ?CL 101 107  ?CO2 27 24  ?GLUCOSE 135* 97  ?BUN 6 9  ?CREATININE 0.49* 0.45*  ?CALCIUM 8.5* 8.3*  ? ?PT/INR ?No results for input(s): LABPROT, INR in the last 72 hours. ? ?Studies/Results: ?No results found. ? ?Anti-infectives: ?Anti-infectives (From admission, onward)  ? ? Start     Dose/Rate Route Frequency Ordered Stop  ? 07/02/21 0730  metroNIDAZOLE (FLAGYL) IVPB 500 mg       ? 500 mg ?100 mL/hr over 60 Minutes Intravenous  Once 07/02/21 0719 07/02/21 0824  ? 06/28/21 2000  doxycycline (VIBRAMYCIN) 100 mg in sodium chloride 0.9 % 250 mL IVPB       ? 100 mg ?125 mL/hr over 120 Minutes Intravenous Every 12 hours 06/28/21 1820 07/05/21 0859  ? 06/28/21 1900  cefTRIAXone (ROCEPHIN) 2 g in sodium chloride 0.9 % 100 mL IVPB  Status:  Discontinued       ? 2 g ?200 mL/hr over 30 Minutes Intravenous Every 24 hours 06/28/21 1811 07/02/21 1310  ? ?  ? ? ?Assessment/Plan: ?Patient s/p Ex lap, LOA and SBR. Doing fair.  Awaiting return of bowel function. Paraplegic so cannot tell if he is having flatus. NG output down. ? ?KUB ordered to assess ?Continue suppositories  ?RN to let me know if has BM ?NPO, NG  ?Patient aware that we do not want to stress the anastomosis before it is ready by feeding too soon  ?Can suck on hard candy  ? ?Updated team.  ? LOS: 7 days  ? ? ?Lucretia Roers ?07/04/2021 ? ?

## 2021-07-05 ENCOUNTER — Inpatient Hospital Stay (HOSPITAL_COMMUNITY): Payer: Medicare Other

## 2021-07-05 LAB — GLUCOSE, CAPILLARY
Glucose-Capillary: 76 mg/dL (ref 70–99)
Glucose-Capillary: 86 mg/dL (ref 70–99)
Glucose-Capillary: 91 mg/dL (ref 70–99)
Glucose-Capillary: 94 mg/dL (ref 70–99)

## 2021-07-05 LAB — HEMOGLOBIN AND HEMATOCRIT, BLOOD
HCT: 32.1 % — ABNORMAL LOW (ref 39.0–52.0)
Hemoglobin: 10.3 g/dL — ABNORMAL LOW (ref 13.0–17.0)

## 2021-07-05 LAB — MAGNESIUM: Magnesium: 1.9 mg/dL (ref 1.7–2.4)

## 2021-07-05 MED ORDER — FLEET ENEMA 7-19 GM/118ML RE ENEM
1.0000 | ENEMA | Freq: Once | RECTAL | Status: AC
Start: 2021-07-05 — End: 2021-07-05
  Administered 2021-07-05: 1 via RECTAL

## 2021-07-05 NOTE — Progress Notes (Signed)
[facility] ? ?No BM with the suppository but non specific bowel gas pattern on repeat KUB and tolerated the sips of water and juice yesterday without any nausea. He says he feels good and not bloated. Gravity bag with minimal thin gastric fluid in it. Nothing recorded from overnight from gravity bag. ? ?Dc NG ?Clear diet, GO SLOW, patient aware ?Enema to get the old contrast out of the rectum/descending colon ? ?Algis Greenhouse, MD ?[facility] ?[address] [place] [address] ?[place], [place] [address] ?[phone] (office) ? ?
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
## 2021-07-05 NOTE — Progress Notes (Signed)
CBG 76, juice given and advised to sip on during night.  ?

## 2021-07-06 ENCOUNTER — Encounter (HOSPITAL_COMMUNITY): Payer: Self-pay | Admitting: Surgery

## 2021-07-06 LAB — CBC
HCT: 32.3 % — ABNORMAL LOW (ref 39.0–52.0)
Hemoglobin: 10.2 g/dL — ABNORMAL LOW (ref 13.0–17.0)
MCH: 25.4 pg — ABNORMAL LOW (ref 26.0–34.0)
MCHC: 31.6 g/dL (ref 30.0–36.0)
MCV: 80.3 fL (ref 80.0–100.0)
Platelets: 315 10*3/uL (ref 150–400)
RBC: 4.02 MIL/uL — ABNORMAL LOW (ref 4.22–5.81)
RDW: 14.3 % (ref 11.5–15.5)
WBC: 8.5 10*3/uL (ref 4.0–10.5)
nRBC: 0 % (ref 0.0–0.2)

## 2021-07-06 LAB — SURGICAL PATHOLOGY

## 2021-07-06 LAB — BASIC METABOLIC PANEL
Anion gap: 5 (ref 5–15)
BUN: 6 mg/dL (ref 6–20)
CO2: 27 mmol/L (ref 22–32)
Calcium: 8 mg/dL — ABNORMAL LOW (ref 8.9–10.3)
Chloride: 104 mmol/L (ref 98–111)
Creatinine, Ser: 0.45 mg/dL — ABNORMAL LOW (ref 0.61–1.24)
GFR, Estimated: >60 mL/min (ref >60)
Glucose, Bld: 95 mg/dL (ref 70–99)
Potassium: 3.6 mmol/L (ref 3.5–5.1)
Sodium: 136 mmol/L (ref 135–145)

## 2021-07-06 LAB — GLUCOSE, CAPILLARY
Glucose-Capillary: 124 mg/dL — ABNORMAL HIGH (ref 70–99)
Glucose-Capillary: 86 mg/dL (ref 70–99)

## 2021-07-06 LAB — MAGNESIUM: Magnesium: 1.8 mg/dL (ref 1.7–2.4)

## 2021-07-06 MED ORDER — METHOCARBAMOL 500 MG PO TABS
500.0000 mg | ORAL_TABLET | Freq: Three times a day (TID) | ORAL | Status: DC
  Administered 2021-07-06 – 2021-07-08 (×7): 500 mg via ORAL
  Filled 2021-07-06 (×7): qty 1

## 2021-07-06 MED ORDER — SORBITOL 70 % SOLN
960.0000 mL | TOPICAL_OIL | Freq: Once | ORAL | Status: AC
  Administered 2021-07-06: 960 mL via RECTAL
  Filled 2021-07-06: qty 473

## 2021-07-06 MED ORDER — HYDROMORPHONE HCL 1 MG/ML IJ SOLN: 0.5000 mg | Freq: Four times a day (QID) | INTRAMUSCULAR | Status: DC | PRN

## 2021-07-06 MED ORDER — SENNOSIDES-DOCUSATE SODIUM 8.6-50 MG PO TABS
2.0000 | ORAL_TABLET | Freq: Two times a day (BID) | ORAL | Status: DC
  Administered 2021-07-06 – 2021-07-07 (×3): 2 via ORAL
  Filled 2021-07-06 (×5): qty 2

## 2021-07-06 NOTE — Progress Notes (Signed)
Wound care to chronic, nonhealing right ischial tuberosity pressure injury (POA): Cleanse with NS, pat surrounding skin dry. Fill defect with size appropriate piece of silver hydrofiber ([employer]qu[employer]el [employer], [place] ![id number]), top with dry gauze and secure with silicone foam. Keep HOB at or below a 30 degree angle while in bed. Limit time sitting to 30 minutes before boosting to relieve pressure-DONE BY THIS LPN PT TOLERATED WELL ?

## 2021-07-06 NOTE — Progress Notes (Signed)
SMOG enema  Dulcolax suppository administered with no results ?
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
## 2021-07-06 NOTE — Progress Notes (Signed)
[facility] Progress Note ? ?4 Days Post-Op  ?Subjective: ?Patient seen and examined.  He is resting comfortably in bed.  He is able to tolerate clear liquids without nausea and vomiting.  He denies any bowel movements or hearing any flatus.  His abdominal pain is well controlled at this time.  He denies fevers and chills. ? ?Objective: ?Vital signs in last 24 hours: ?Temp:  [97 ?F (36.1 ?C)-98.3 ?F (36.8 ?C)] 98.3 ?F (36.8 ?C) (05/01 0503) ?Pulse Rate:  [70-79] 79 (05/01 0503) ?Resp:  [17-20] 17 (05/01 0503) ?BP: (128-147)/(80-94) 132/85 (05/01 0503) ?SpO2:  [98 %-99 %] 98 % (05/01 0503) ?Last BM Date : 06/28/21 ? ?Intake/Output from previous day: ?04/30 0701 - 05/01 0700 ?In: 240 [P.O.:240] ?Out: 1375 [Urine:1375] ?Intake/Output this shift: ?No intake/output data recorded. ? ?General appearance: alert, cooperative, and no distress ?GI: Soft, mild distention, no percussion tenderness, nontender to palpation, midline incision C/D/I with skin staples in place and overlying honeycomb dressing, no strikethrough or ? ?Lab Results:  ?Recent Labs  ?  07/04/21 ?0510 07/05/21 ?8242 07/06/21 ?0444  ?WBC 9.7  --  8.5  ?HGB 11.2* 10.3* 10.2*  ?HCT 35.1* 32.1* 32.3*  ?PLT 269  --  315  ? ?BMET ?Recent Labs  ?  07/04/21 ?0510 07/06/21 ?0444  ?NA 137 136  ?K 4.1 3.6  ?CL 107 104  ?CO2 24 27  ?GLUCOSE 97 95  ?BUN 9 6  ?CREATININE 0.45* 0.45*  ?CALCIUM 8.3* 8.0*  ? ?PT/INR ?No results for input(s): LABPROT, INR in the last 72 hours. ? ?Studies/Results: ?DG Abd 1 View ? ?Result Date: 07/05/2021 ?CLINICAL DATA:  Postoperative ileus. EXAM: ABDOMEN - 1 VIEW COMPARISON:  Radiograph yesterday FINDINGS: Enteric contrast persists in the ascending and transverse colon. Prominent air-filled loop of small bowel in the right lower quadrant, slightly better defined on the current exam, 2.8 cm dimension. No other small bowel distension. Midline skin staples again seen. Enteric tube remains in place. IVC filter remains in place.  Cholecystectomy clips. Stable osseous findings. IMPRESSION: 1. Prominent air-filled loop of small bowel in the right lower quadrant, slightly better defined on the current exam, likely secondary to ileus. 2. There is enteric contrast in the colon that has not progressed much since yesterday. Electronically Signed   By: Narda Rutherford M.D.   On: 07/05/2021 16:21  ? ?DG Abd 1 View ? ?Result Date: 07/04/2021 ?CLINICAL DATA:  Postoperative ileus. Patient reports abdominal pain. EXAM: ABDOMEN - 1 VIEW COMPARISON:  Preoperative CT 07/01/2021 FINDINGS: Tip and side port of the enteric tube below the diaphragm in the stomach. Enteric contrast is seen in the ascending and transverse colon with formed stool. There may be a minimally prominent loop of small bowel in the right mid abdomen, but no other gaseous small bowel distension. Midline skin staples. IVC filter in place. Cholecystectomy clips. Right ischial decubitus ulcer. IMPRESSION: 1. Tip and side port of the enteric tube below the diaphragm in the stomach. 2. Nonspecific postoperative bowel gas pattern. Minimally prominent loop of small bowel in the right abdomen without significant gaseous small bowel distension. Enteric contrast in the ascending and transverse colon from prior CT. Electronically Signed   By: Narda Rutherford M.D.   On: 07/04/2021 12:54   ? ?Anti-infectives: ?Anti-infectives (From admission, onward)  ? ? Start     Dose/Rate Route Frequency Ordered Stop  ? 07/02/21 0730  metroNIDAZOLE (FLAGYL) IVPB 500 mg       ? 500 mg ?100 mL/hr over  60 Minutes Intravenous  Once 07/02/21 0719 07/02/21 0824  ? 06/28/21 2000  doxycycline (VIBRAMYCIN) 100 mg in sodium chloride 0.9 % 250 mL IVPB       ? 100 mg ?125 mL/hr over 120 Minutes Intravenous Every 12 hours 06/28/21 1820 07/04/21 2358  ? 06/28/21 1900  cefTRIAXone (ROCEPHIN) 2 g in sodium chloride 0.9 % 100 mL IVPB  Status:  Discontinued       ? 2 g ?200 mL/hr over 30 Minutes Intravenous Every 24 hours 06/28/21  1811 07/02/21 1310  ? ?  ? ? ?Assessment/Plan: ? ?Patient is a 61 year old male who was admitted with small bowel obstruction, status post exploratory laparotomy, extensive lysis of adhesions, and small bowel resection on 4/27. ? ?-Continue clear liquid diet at this time ?-Smog enema ordered ?-Continue Dulcolax suppositories ?-We will hold off on any oral laxatives given patient with new anastomosis ?-Continue to monitor for return of bowel function ?-Blood work and vitals are stable ?-Patient has not received a dose of Dilaudid in over 24 hours, decrease frequency to every 6 hours ?-Robaxin transition to PO from IV ?-Appreciate hospitalist recommendations ? ? LOS: 9 days  ? ? ?Justiss Gerbino A Exavior Kimmons ?07/06/2021 ? ?

## 2021-07-07 ENCOUNTER — Inpatient Hospital Stay (HOSPITAL_COMMUNITY): Payer: Medicare Other

## 2021-07-07 LAB — GLUCOSE, CAPILLARY
Glucose-Capillary: 92 mg/dL (ref 70–99)
Glucose-Capillary: 99 mg/dL (ref 70–99)
Glucose-Capillary: 125 mg/dL — ABNORMAL HIGH (ref 70–99)

## 2021-07-07 MED ORDER — KETOROLAC TROMETHAMINE 15 MG/ML IJ SOLN
15.0000 mg | Freq: Four times a day (QID) | INTRAMUSCULAR | Status: AC
  Administered 2021-07-07 (×2): 15 mg via INTRAVENOUS
  Filled 2021-07-07 (×2): qty 1
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
## 2021-07-07 NOTE — Progress Notes (Signed)
[facility] Progress Note ? ?5 Days Post-Op  ?Subjective: ?Patient seen and examined.  He is sitting in bed, eating his lunch.  He is tolerating his lunch, however he does state he has a little bit of nausea.  He denies any episodes of vomiting.  He had 3 bowel movements overnight, 2 were liquidy, and one was a good size.  He denies significant abdominal pain.  He denies fever and chills. ? ?Objective: ?Vital signs in last 24 hours: ?Temp:  [97.6 ?F (36.4 ?C)-98.1 ?F (36.7 ?C)] 97.6 ?F (36.4 ?C) (05/02 1258) ?Pulse Rate:  [86-91] 86 (05/02 1258) ?Resp:  [16-18] 16 (05/02 1258) ?BP: (118-137)/(83-84) 137/84 (05/02 1258) ?SpO2:  [99 %-100 %] 100 % (05/02 1258) ?Last BM Date : 07/06/21 ? ?Intake/Output from previous day: ?05/01 0701 - 05/02 0700 ?In: 3090.5 [P.O.:1320; I.V.:1770.5] ?Out: 1650 [Urine:1650] ?Intake/Output this shift: ?Total I/O ?In: 360 [P.O.:360] ?Out: -  ? ?General appearance: alert, cooperative, and no distress ?GI: Abdomen soft, nondistended, non tender to percussion and palpation, Incisions C/D/I with skin staples in place with honeycomb dressing without strikethrough ? ?Lab Results:  ?Recent Labs  ?  07/05/21 ?0459 07/06/21 ?0444  ?WBC  --  8.5  ?HGB 10.3* 10.2*  ?HCT 32.1* 32.3*  ?PLT  --  315  ? ?BMET ?Recent Labs  ?  07/06/21 ?0444  ?NA 136  ?K 3.6  ?CL 104  ?CO2 27  ?GLUCOSE 95  ?BUN 6  ?CREATININE 0.45*  ?CALCIUM 8.0*  ? ?PT/INR ?No results for input(s): LABPROT, INR in the last 72 hours. ? ?Studies/Results: ?DG Abd 1 View ? ?Result Date: 07/07/2021 ?CLINICAL DATA:  Postoperative ileus. EXAM: ABDOMEN - 1 VIEW COMPARISON:  July 05, 2021. FINDINGS: The bowel gas pattern is normal. Status post cholecystectomy. Surgical staples are seen in left lower quadrant. IMPRESSION: No abnormal bowel dilatation is noted. Electronically Signed   By: Lupita Raider M.D.   On: 07/07/2021 08:20  ? ?DG Abd 1 View ? ?Result Date: 07/05/2021 ?CLINICAL DATA:  Postoperative ileus. EXAM: ABDOMEN - 1  VIEW COMPARISON:  Radiograph yesterday FINDINGS: Enteric contrast persists in the ascending and transverse colon. Prominent air-filled loop of small bowel in the right lower quadrant, slightly better defined on the current exam, 2.8 cm dimension. No other small bowel distension. Midline skin staples again seen. Enteric tube remains in place. IVC filter remains in place. Cholecystectomy clips. Stable osseous findings. IMPRESSION: 1. Prominent air-filled loop of small bowel in the right lower quadrant, slightly better defined on the current exam, likely secondary to ileus. 2. There is enteric contrast in the colon that has not progressed much since yesterday. Electronically Signed   By: Narda Rutherford M.D.   On: 07/05/2021 16:21   ? ?Anti-infectives: ?Anti-infectives (From admission, onward)  ? ? Start     Dose/Rate Route Frequency Ordered Stop  ? 07/02/21 0730  metroNIDAZOLE (FLAGYL) IVPB 500 mg       ? 500 mg ?100 mL/hr over 60 Minutes Intravenous  Once 07/02/21 0719 07/02/21 0824  ? 06/28/21 2000  doxycycline (VIBRAMYCIN) 100 mg in sodium chloride 0.9 % 250 mL IVPB       ? 100 mg ?125 mL/hr over 120 Minutes Intravenous Every 12 hours 06/28/21 1820 07/04/21 2358  ? 06/28/21 1900  cefTRIAXone (ROCEPHIN) 2 g in sodium chloride 0.9 % 100 mL IVPB  Status:  Discontinued       ? 2 g ?200 mL/hr over 30 Minutes Intravenous Every 24 hours 06/28/21  1811 07/02/21 1310  ? ?  ? ? ?Assessment/Plan: ? ?Patient is a 61 year old male who was admitted with small bowel obstruction, status post exploratory laparotomy, extensive lysis of adhesions, and small bowel resection on 4/27. ? ?-Patient with bowel function overnight ?-KUB without signs of dilated loops of small bowel, contrast appears to still be within right colon ?-Continue full liquid diet ?-Will hold off on advancing diet at this time given current nausea. Likely advance to GI soft diet tomorrow ?-Continue Dulcolax suppositories and Senokot-S - patient currently refusing  bowel regimen, as he thinks it is making him nauseous ?-Recommend discontinuing dilaudid if patient no longer requiring it ?-Patient might require discharge home with PO Robaxin for a week ?-Appreciate hospitalist recommendations ?-Dr. Lovell Sheehan will see the patient for me starting on 5/3 ? ? ? LOS: 10 days  ? ? ?Ree Alcalde A Roxie Gueye ?07/07/2021 ? ?

## 2021-07-08 LAB — BASIC METABOLIC PANEL
Anion gap: 6 (ref 5–15)
BUN: 7 mg/dL (ref 6–20)
CO2: 25 mmol/L (ref 22–32)
Calcium: 8.1 mg/dL — ABNORMAL LOW (ref 8.9–10.3)
Chloride: 105 mmol/L (ref 98–111)
Creatinine, Ser: 0.46 mg/dL — ABNORMAL LOW (ref 0.61–1.24)
GFR, Estimated: >60 mL/min (ref >60)
Glucose, Bld: 95 mg/dL (ref 70–99)
Potassium: 3.5 mmol/L (ref 3.5–5.1)
Sodium: 136 mmol/L (ref 135–145)

## 2021-07-08 LAB — MAGNESIUM: Magnesium: 1.7 mg/dL (ref 1.7–2.4)

## 2021-07-08 LAB — CBC
HCT: 34 % — ABNORMAL LOW (ref 39.0–52.0)
Hemoglobin: 10.6 g/dL — ABNORMAL LOW (ref 13.0–17.0)
MCH: 25.4 pg — ABNORMAL LOW (ref 26.0–34.0)
MCHC: 31.2 g/dL (ref 30.0–36.0)
MCV: 81.5 fL (ref 80.0–100.0)
Platelets: 312 10*3/uL (ref 150–400)
RBC: 4.17 MIL/uL — ABNORMAL LOW (ref 4.22–5.81)
RDW: 14.7 % (ref 11.5–15.5)
WBC: 6.4 10*3/uL (ref 4.0–10.5)
nRBC: 0 % (ref 0.0–0.2)
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
## 2021-07-08 NOTE — Progress Notes (Signed)
6 Days Post-Op  ?Subjective: ?Patient tolerating full liquid diet well.  Is having multiple bowel movements.  Wants to be discharged. ? ?Objective: ?Vital signs in last 24 hours: ?Temp:  [97.6 ?F (36.4 ?C)-97.7 ?F (36.5 ?C)] 97.7 ?F (36.5 ?C) (05/03 0450) ?Pulse Rate:  [75-86] 77 (05/03 0450) ?Resp:  [16-18] 18 (05/03 0450) ?BP: (134-152)/(84-89) 134/89 (05/03 0450) ?SpO2:  [100 %] 100 % (05/03 0450) ?Last BM Date : 07/07/21 ? ?Intake/Output from previous day: ?05/02 0701 - 05/03 0700 ?In: 1200 [P.O.:1200] ?Out: 1150 [Urine:1150] ?Intake/Output this shift: ?No intake/output data recorded. ? ?General appearance: alert, cooperative, and no distress ?GI: soft, non-tender; bowel sounds normal; no masses,  no organomegaly and incision healing well. ? ?Lab Results:  ?Recent Labs  ?  07/06/21 ?0444 07/08/21 ?0532  ?WBC 8.5 6.4  ?HGB 10.2* 10.6*  ?HCT 32.3* 34.0*  ?PLT 315 312  ? ?BMET ?Recent Labs  ?  07/06/21 ?0444 07/08/21 ?0532  ?NA 136 136  ?K 3.6 3.5  ?CL 104 105  ?CO2 27 25  ?GLUCOSE 95 95  ?BUN 6 7  ?CREATININE 0.45* 0.46*  ?CALCIUM 8.0* 8.1*  ? ?PT/INR ?No results for input(s): LABPROT, INR in the last 72 hours. ? ?Studies/Results: ?DG Abd 1 View ? ?Result Date: 07/07/2021 ?CLINICAL DATA:  Postoperative ileus. EXAM: ABDOMEN - 1 VIEW COMPARISON:  July 05, 2021. FINDINGS: The bowel gas pattern is normal. Status post cholecystectomy. Surgical staples are seen in left lower quadrant. IMPRESSION: No abnormal bowel dilatation is noted. Electronically Signed   By: Marijo Conception M.D.   On: 07/07/2021 08:20   ? ?Anti-infectives: ?Anti-infectives (From admission, onward)  ? ? Start     Dose/Rate Route Frequency Ordered Stop  ? 07/02/21 0730  metroNIDAZOLE (FLAGYL) IVPB 500 mg       ? 500 mg ?100 mL/hr over 60 Minutes Intravenous  Once 07/02/21 0719 07/02/21 0824  ? 06/28/21 2000  doxycycline (VIBRAMYCIN) 100 mg in sodium chloride 0.9 % 250 mL IVPB       ? 100 mg ?125 mL/hr over 120 Minutes Intravenous Every 12 hours  06/28/21 1820 07/04/21 2358  ? 06/28/21 1900  cefTRIAXone (ROCEPHIN) 2 g in sodium chloride 0.9 % 100 mL IVPB  Status:  Discontinued       ? 2 g ?200 mL/hr over 30 Minutes Intravenous Every 24 hours 06/28/21 1811 07/02/21 1310  ? ?  ? ? ?Assessment/Plan: ?s/p Procedure(s): ?EXPLORATORY LAPAROTOMY ?SMALL BOWEL RESECTION ?Impression: Patient has recovered from the surgery.  Is okay for discharge from surgery standpoint.  I have [place] the Foley as he straight caths himself.  He already has methadone at home.  He will call the office for a follow-up appointment. ? LOS: 11 days  ? ? ?Aviva Signs ?07/08/2021  ?

## 2021-07-08 NOTE — Progress Notes (Signed)
Discharge instructions given patient verbalized understanding. Removed IV. Provided pericare after removal of foley catheter. Assisted patient getting dressed and helped into wheelchair. Returned medication methadone from pharmacy placed copy in chart. Assisted patient to private vehicle with tech.  ?

## 2021-07-08 NOTE — Care Management Important Message (Signed)
Important Message ? ?Patient Details  ?Name: [name] ?MRN: [id number] ?Date of Birth: 1960/11/15 ? ? ?Medicare Important Message Given:  Yes ? ? ? ? ?Corey Harold ?07/08/2021, 10:59 AM ?

## 2021-07-21 ENCOUNTER — Ambulatory Visit (INDEPENDENT_AMBULATORY_CARE_PROVIDER_SITE_OTHER): Payer: Medicare Other | Admitting: Surgery

## 2021-07-21 ENCOUNTER — Encounter: Payer: Self-pay | Admitting: Surgery

## 2021-07-21 VITALS — BP 128/81 | HR 83 | Temp 98.1°F | Resp 12 | Ht 69.0 in

## 2021-07-21 DIAGNOSIS — Z09 Encounter for follow-up examination after completed treatment for conditions other than malignant neoplasm: Secondary | ICD-10-CM

## 2021-07-22 NOTE — Progress Notes (Signed)
[facility] Note   HPI:  61 y.o. Male presents to clinic for post-op follow-up status post admission for bowel obstruction with subsequent exploratory laparotomy, lysis of adhesions, and small bowel resection on 4/27.  Since leaving the hospital, the patient has been doing well.  He is tolerating a diet without nausea and vomiting, and he is moving his bowels without issue.  His pain is adequately controlled.  His only complaint is that he has a little bit of redness at the inferior aspect of his incision.  He denies any fevers or chills.  Review of Systems:  All other review of systems: otherwise negative   Vital Signs:  BP 128/81   Pulse 83   Temp 98.1 F (36.7 C) (Oral)   Resp 12   Ht 5\' 9"  (1.753 m)   SpO2 96%   BMI 22.15 kg/m    Physical Exam:  Physical Exam Vitals reviewed.  Constitutional:      Appearance: Normal appearance.  Abdominal:     Comments: Abdomen soft, nondistended, no percussion tenderness, nontender to palpation; no rigidity, guarding, or rebound tenderness; midline incision healing well with skin staples in place, mild erythema at skin staple entrance sites  Neurological:     Mental Status: He is alert.   Laboratory studies: None  Imaging:  None  Pathology: A. SMALL BOWEL, RESECTION:  - Focal changes consistent with diverticuli.  - Adhesive changes of serosal surface.  - No malignancy identified.   Assessment:  61 y.o. yo Male who presents status post hospitalization for small bowel obstruction and subsequent exploratory laparotomy, lysis of adhesion, and small bowel resection on 4/27.  Plan:  -Patient is doing well.  He is tolerating a diet without nausea and vomiting, continuing to move his bowels, and his pain is adequately controlled -Skin staples removed -Advised the patient to follow-up with me as needed  All of the above recommendations were discussed with the patient, and all of patient's questions were answered to his  expressed satisfaction.  Graciella Freer, DO [facility] [address] [place], [place] [address] [phone] (office)

## 2022-05-14 ENCOUNTER — Emergency Department (HOSPITAL_COMMUNITY)
Admission: EM | Admit: 2022-05-14 | Discharge: 2022-05-14 | Disposition: A | Discharge status: Home / Self Care | Payer: 59 | Attending: Emergency Medicine | Admitting: Emergency Medicine

## 2022-05-14 ENCOUNTER — Emergency Department (HOSPITAL_COMMUNITY): Payer: 59

## 2022-05-14 ENCOUNTER — Encounter (HOSPITAL_COMMUNITY): Payer: Self-pay

## 2022-05-14 DIAGNOSIS — L89159 Pressure ulcer of sacral region, unspecified stage: Secondary | ICD-10-CM | POA: Diagnosis present

## 2022-05-14 DIAGNOSIS — L89154 Pressure ulcer of sacral region, stage 4: Secondary | ICD-10-CM | POA: Diagnosis not present

## 2022-05-14 LAB — CBC WITH DIFFERENTIAL/PLATELET
Abs Immature Granulocytes: 0.02 K/uL (ref 0.00–0.07)
Basophils Absolute: 0 K/uL (ref 0.0–0.1)
Basophils Relative: 0 %
Eosinophils Absolute: 0.1 K/uL (ref 0.0–0.5)
Eosinophils Relative: 1 %
HCT: 36.6 % — ABNORMAL LOW (ref 39.0–52.0)
Hemoglobin: 11.4 g/dL — ABNORMAL LOW (ref 13.0–17.0)
Immature Granulocytes: 0 %
Lymphocytes Relative: 20 %
Lymphs Abs: 1.2 K/uL (ref 0.7–4.0)
MCH: 23.3 pg — ABNORMAL LOW (ref 26.0–34.0)
MCHC: 31.1 g/dL (ref 30.0–36.0)
MCV: 74.8 fL — ABNORMAL LOW (ref 80.0–100.0)
Monocytes Absolute: 0.5 K/uL (ref 0.1–1.0)
Monocytes Relative: 9 %
Neutro Abs: 4.1 K/uL (ref 1.7–7.7)
Neutrophils Relative %: 70 %
Platelets: 325 K/uL (ref 150–400)
RBC: 4.89 MIL/uL (ref 4.22–5.81)
RDW: 17 % — ABNORMAL HIGH (ref 11.5–15.5)
WBC: 5.9 K/uL (ref 4.0–10.5)
nRBC: 0 % (ref 0.0–0.2)

## 2022-05-14 LAB — COMPREHENSIVE METABOLIC PANEL WITH GFR
ALT: 9 U/L (ref 0–44)
AST: 20 U/L (ref 15–41)
Albumin: 3.3 g/dL — ABNORMAL LOW (ref 3.5–5.0)
Alkaline Phosphatase: 106 U/L (ref 38–126)
Anion gap: 8 (ref 5–15)
BUN: 16 mg/dL (ref 8–23)
CO2: 26 mmol/L (ref 22–32)
Calcium: 8.5 mg/dL — ABNORMAL LOW (ref 8.9–10.3)
Chloride: 99 mmol/L (ref 98–111)
Creatinine, Ser: 0.6 mg/dL — ABNORMAL LOW (ref 0.61–1.24)
GFR, Estimated: >60 mL/min (ref >60)
Glucose, Bld: 110 mg/dL — ABNORMAL HIGH (ref 70–99)
Potassium: 3.7 mmol/L (ref 3.5–5.1)
Sodium: 133 mmol/L — ABNORMAL LOW (ref 135–145)
Total Bilirubin: 0.2 mg/dL — ABNORMAL LOW (ref 0.3–1.2)
Total Protein: 8.1 g/dL (ref 6.5–8.1)

## 2022-05-14 LAB — SEDIMENTATION RATE: Sed Rate: 42 mm/hr — ABNORMAL HIGH (ref 0–16)

## 2022-05-14 LAB — C-REACTIVE PROTEIN: CRP: 4.8 mg/dL — ABNORMAL HIGH (ref <1.0)

## 2022-05-14 MED ORDER — IOHEXOL 300 MG/ML  SOLN
100.0000 mL | Freq: Once | INTRAMUSCULAR | Status: AC | PRN
  Administered 2022-05-14: 80 mL via INTRAVENOUS

## 2022-05-14 MED ORDER — IBUPROFEN 600 MG PO TABS: 600.0000 mg | ORAL_TABLET | Freq: Three times a day (TID) | ORAL | 0 refills | Status: DC | PRN

## 2022-05-14 NOTE — ED Notes (Signed)
Patient transported to CT 

## 2022-05-14 NOTE — ED Triage Notes (Signed)
Pt with chronic sacral ulcer for the last fifteen years. He states that over the last two days he's having increased drainage, pain, and chills. No antipyretics PTA. Pt also states that he's concerned for the wound tunneling to his groin area.

## 2022-05-14 NOTE — Discharge Instructions (Signed)
The CT scan is negative for any acute finding or fluid collection consistent with severe infection. Still, infection is possible therefore if your symptoms worsen, you start having new drainage, fevers, worsening pain return to the emergency room.  We have ordered home health request as well.  If your insurance approves, expect a call from home health agency to assist you further.

## 2022-05-19 LAB — CULTURE, BLOOD (ROUTINE X 2)
Culture: NO GROWTH
Culture: NO GROWTH
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
## 2022-06-30 ENCOUNTER — Ambulatory Visit: Payer: 59 | Admitting: Internal Medicine

## 2022-07-14 ENCOUNTER — Ambulatory Visit: Payer: 59 | Admitting: Internal Medicine

## 2022-09-13 ENCOUNTER — Other Ambulatory Visit: Payer: Self-pay

## 2022-09-13 ENCOUNTER — Emergency Department (HOSPITAL_COMMUNITY): Payer: 59

## 2022-09-13 ENCOUNTER — Encounter (HOSPITAL_COMMUNITY): Payer: Self-pay | Admitting: Emergency Medicine

## 2022-09-13 ENCOUNTER — Emergency Department (HOSPITAL_COMMUNITY)
Admission: EM | Admit: 2022-09-13 | Discharge: 2022-09-13 | Disposition: A | Discharge status: Home / Self Care | Payer: 59 | Source: Home / Self Care | Attending: Emergency Medicine | Admitting: Emergency Medicine

## 2022-09-13 DIAGNOSIS — L89154 Pressure ulcer of sacral region, stage 4: Secondary | ICD-10-CM | POA: Insufficient documentation

## 2022-09-13 DIAGNOSIS — B852 Pediculosis, unspecified: Secondary | ICD-10-CM

## 2022-09-13 DIAGNOSIS — B850 Pediculosis due to Pediculus humanus capitis: Secondary | ICD-10-CM | POA: Insufficient documentation

## 2022-09-13 LAB — COMPREHENSIVE METABOLIC PANEL
ALT: 9 U/L (ref 0–44)
AST: 12 U/L — ABNORMAL LOW (ref 15–41)
Albumin: 2.8 g/dL — ABNORMAL LOW (ref 3.5–5.0)
Alkaline Phosphatase: 73 U/L (ref 38–126)
Anion gap: 7 (ref 5–15)
BUN: 10 mg/dL (ref 8–23)
CO2: 27 mmol/L (ref 22–32)
Calcium: 8.3 mg/dL — ABNORMAL LOW (ref 8.9–10.3)
Chloride: 101 mmol/L (ref 98–111)
Creatinine, Ser: 0.62 mg/dL (ref 0.61–1.24)
GFR, Estimated: >60 mL/min (ref >60)
Glucose, Bld: 89 mg/dL (ref 70–99)
Potassium: 4.3 mmol/L (ref 3.5–5.1)
Sodium: 135 mmol/L (ref 135–145)
Total Bilirubin: 0.3 mg/dL (ref 0.3–1.2)
Total Protein: 7.3 g/dL (ref 6.5–8.1)

## 2022-09-13 LAB — CBC WITH DIFFERENTIAL/PLATELET
Abs Immature Granulocytes: 0.01 10*3/uL (ref 0.00–0.07)
Basophils Absolute: 0 10*3/uL (ref 0.0–0.1)
Basophils Relative: 1 %
Eosinophils Absolute: 0.1 10*3/uL (ref 0.0–0.5)
Eosinophils Relative: 2 %
HCT: 33.6 % — ABNORMAL LOW (ref 39.0–52.0)
Hemoglobin: 10 g/dL — ABNORMAL LOW (ref 13.0–17.0)
Immature Granulocytes: 0 %
Lymphocytes Relative: 24 %
Lymphs Abs: 0.8 10*3/uL (ref 0.7–4.0)
MCH: 22.9 pg — ABNORMAL LOW (ref 26.0–34.0)
MCHC: 29.8 g/dL — ABNORMAL LOW (ref 30.0–36.0)
MCV: 77.1 fL — ABNORMAL LOW (ref 80.0–100.0)
Monocytes Absolute: 0.4 10*3/uL (ref 0.1–1.0)
Monocytes Relative: 10 %
Neutro Abs: 2.2 10*3/uL (ref 1.7–7.7)
Neutrophils Relative %: 63 %
Platelets: 310 10*3/uL (ref 150–400)
RBC: 4.36 MIL/uL (ref 4.22–5.81)
RDW: 15.2 % (ref 11.5–15.5)
WBC: 3.5 10*3/uL — ABNORMAL LOW (ref 4.0–10.5)
nRBC: 0 % (ref 0.0–0.2)

## 2022-09-13 LAB — URINALYSIS, ROUTINE W REFLEX MICROSCOPIC
Bilirubin Urine: NEGATIVE
Glucose, UA: NEGATIVE mg/dL
Hgb urine dipstick: NEGATIVE
Ketones, ur: 5 mg/dL — AB
Nitrite: NEGATIVE
Protein, ur: NEGATIVE mg/dL
Specific Gravity, Urine: 1.017 (ref 1.005–1.030)
pH: 5 (ref 5.0–8.0)

## 2022-09-13 LAB — C-REACTIVE PROTEIN: CRP: 6.1 mg/dL — ABNORMAL HIGH (ref <1.0)

## 2022-09-13 LAB — SEDIMENTATION RATE: Sed Rate: 47 mm/hr — ABNORMAL HIGH (ref 0–16)

## 2022-09-13 LAB — LACTIC ACID, PLASMA: Lactic Acid, Venous: 0.7 mmol/L (ref 0.5–1.9)

## 2022-09-13 MED ORDER — MALATHION 0.5 % EX LOTN: TOPICAL_LOTION | Freq: Once | CUTANEOUS | 0 refills | Status: AC

## 2022-09-13 MED ORDER — DOXYCYCLINE HYCLATE 100 MG PO CAPS: 100.0000 mg | ORAL_CAPSULE | Freq: Two times a day (BID) | ORAL | 0 refills | Status: DC

## 2022-09-13 NOTE — ED Triage Notes (Signed)
Pt via POV c/o pressure ulcer to right buttock, deep and painful with yellow exudate per pt report. Pt also has head lice and has been unable to get rid of them for 3 months. Pt rated 6-8/10. Pt notes that he took 4 OTC ibuprofen tablets PTA.

## 2022-09-13 NOTE — Discharge Instructions (Signed)
As we discussed, I have given you a prescription for doxycycline which is an antibiotic to cover for any infection and your chronic sacral wound.  Ultimately, the most important thing is that you see your wound care team to help facilitate healing.  Please take this antibiotic as prescribed in its entirety.  Additionally, I have given you a prescription for Malathion cream to apply to areas infected with lice.  Please apply once and then wash your head after 8 hours.  If the infestation continues, repeat again in 7 days.  Return if development of any new or worsening symptoms.
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
## 2023-01-13 ENCOUNTER — Encounter (HOSPITAL_COMMUNITY): Payer: Self-pay | Admitting: Radiology

## 2023-01-13 ENCOUNTER — Emergency Department (HOSPITAL_COMMUNITY)
Admission: EM | Admit: 2023-01-13 | Discharge: 2023-01-13 | Disposition: A | Discharge status: Home / Self Care | Payer: 59 | Attending: Emergency Medicine | Admitting: Emergency Medicine

## 2023-01-13 ENCOUNTER — Other Ambulatory Visit: Payer: Self-pay

## 2023-01-13 DIAGNOSIS — R39198 Other difficulties with micturition: Secondary | ICD-10-CM | POA: Diagnosis present

## 2023-01-13 DIAGNOSIS — N35014 Post-traumatic urethral stricture, male, unspecified: Secondary | ICD-10-CM | POA: Diagnosis not present

## 2023-01-13 LAB — URINALYSIS, ROUTINE W REFLEX MICROSCOPIC
Bacteria, UA: NONE SEEN
Bilirubin Urine: NEGATIVE
Glucose, UA: NEGATIVE mg/dL
Ketones, ur: NEGATIVE mg/dL
Leukocytes,Ua: NEGATIVE
Nitrite: NEGATIVE
Protein, ur: NEGATIVE mg/dL
Specific Gravity, Urine: 1.015 (ref 1.005–1.030)
pH: 5 (ref 5.0–8.0)

## 2023-01-13 MED ORDER — CEPHALEXIN 500 MG PO CAPS: 500.0000 mg | ORAL_CAPSULE | Freq: Three times a day (TID) | ORAL | 0 refills | Status: AC

## 2023-01-13 NOTE — ED Triage Notes (Signed)
Pt states that a few days ago he began having problems inserting his catheter to do his in and out cath at home. Pt states he has had to force the catheter through the urethra.

## 2023-01-13 NOTE — Discharge Instructions (Addendum)
You have been prescribed Cephalexin, 500 mg capsules.  This is an antibiotic that is used to treat multiple different infections, please take this medicine until it has completed - even if you are feeling better immediately.  As a side effect, this may cause diarrhea in some people, if you are developing any signs of an allergic reaction please stop the medication immediately and call your doctor or return to the emergency department for worsening symptoms  Please leave the catheter in place until you follow-up with the urologist, I have given you their phone number, he should see them within the week, do not try to take this out yourself.  ER for worsening symptoms

## 2023-01-13 NOTE — ED Provider Notes (Signed)
[place] EMERGENCY DEPARTMENT AT [facility] Provider Note   CSN: [id number] Arrival date & time: 01/13/23  1212     History  No chief complaint on file.   [name] is a 62 y.o. male.  HPI   This patient is a 62 year old male with a history of some paralysis of his lower extremities, has to do in and out self-catheterization at home and states it has been difficult for the last few days feeling like the catheter is having difficulty threading.  He is able to get urine out but with some difficulty and swelling.  No bleeding no fever no abdominal pain.  Home Medications Prior to Admission medications   Medication Sig Start Date End Date Taking? Authorizing Provider  cephALEXin (KEFLEX) 500 MG capsule Take 1 capsule (500 mg total) by mouth 3 (three) times daily for 7 days. 01/13/23 01/20/23 Yes Eber Hong, MD  Docusate Sodium (DSS) 100 MG CAPS Take 1 capsule by mouth 2 (two) times daily. 03/17/11   [provider]  doxycycline (VIBRAMYCIN) 100 MG capsule Take 1 capsule (100 mg total) by mouth 2 (two) times daily. 09/13/22   Smoot, Shawn Route, PA-C  ibuprofen (ADVIL) 600 MG tablet Take 1 tablet (600 mg total) by mouth every 8 (eight) hours as needed. 05/14/22   Derwood Kaplan, MD  methadone (DOLOPHINE) 10 MG tablet Take 20 mg by mouth 3 (three) times daily.    [provider]  naloxone [facility]) nasal spray 4 mg/0.1 mL as directed. 04/10/21   [provider]  pregabalin (LYRICA) 75 MG capsule Take 75 mg by mouth 3 (three) times daily.     [provider]      Allergies    Patient has no known allergies.    Review of Systems   Review of Systems  All other systems reviewed and are negative.   Physical Exam Updated Vital Signs BP (!) 155/91   Pulse 91   Temp 98.3 F (36.8 C) (Oral)   Resp 18   Ht 1.753 m (5\' 9" )   Wt 68 kg   SpO2 100%   BMI 22.15 kg/m  Physical Exam Vitals and nursing note reviewed.  Constitutional:       General: He is not in acute distress.    Appearance: He is well-developed.  HENT:     Head: Normocephalic and atraumatic.     Mouth/Throat:     Pharynx: No oropharyngeal exudate.  Eyes:     General: No scleral icterus.       Right eye: No discharge.        Left eye: No discharge.     Conjunctiva/sclera: Conjunctivae normal.     Pupils: Pupils are equal, round, and reactive to light.  Neck:     Thyroid: No thyromegaly.     Vascular: No JVD.  Cardiovascular:     Rate and Rhythm: Normal rate and regular rhythm.     Heart sounds: Normal heart sounds. No murmur heard.    No friction rub. No gallop.  Pulmonary:     Effort: Pulmonary effort is normal. No respiratory distress.     Breath sounds: Normal breath sounds. No wheezing or rales.  Abdominal:     General: Bowel sounds are normal. There is no distension.     Palpations: Abdomen is soft. There is no mass.     Tenderness: There is no abdominal tenderness.  Genitourinary:    Comments: Urethral meatus is small, no drainage, some incontinence  present Musculoskeletal:        General: No tenderness. Normal range of motion.     Cervical back: Normal range of motion and neck supple.  Lymphadenopathy:     Cervical: No cervical adenopathy.  Skin:    General: Skin is warm and dry.     Findings: No erythema or rash.  Neurological:     Mental Status: He is alert.     Coordination: Coordination normal.     Comments: Lower extremity weakness and paralysis  Psychiatric:        Behavior: Behavior normal.     ED Results / Procedures / Treatments   Labs (all labs ordered are listed, but only abnormal results are displayed) Labs Reviewed  URINE CULTURE  URINALYSIS, ROUTINE W REFLEX MICROSCOPIC    EKG None  Radiology No results found.  Procedures BLADDER CATHETERIZATION  Date/Time: 01/13/2023 1:26 PM  Performed by: Eber Hong, MD Authorized by: Eber Hong, MD   Consent:    Consent obtained:  Verbal   Consent given  by:  Patient   Risks, benefits, and alternatives were discussed: yes     Risks discussed:  False passage, infection, urethral injury, incomplete procedure and pain   Alternatives discussed:  No treatment Universal protocol:    Procedure explained and questions answered to patient or proxy's satisfaction: yes     Immediately prior to procedure, a time out was called: yes     Patient identity confirmed:  Verbally with patient Pre-procedure details:    Procedure purpose:  Therapeutic   Preparation: Patient was prepped and draped in usual sterile fashion   Anesthesia:    Anesthesia method:  None Procedure details:    Provider performed due to:  Complicated insertion, altered anatomy and nurse unable to complete   Altered anatomy:  Urethral stricture   Catheter insertion:  Indwelling   Catheter type:  Coude   Bladder irrigation: no     Number of attempts:  1   Urine characteristics:  Clear Post-procedure details:    Procedure completion:  Tolerated well, no immediate complications Comments:           Medications Ordered in ED Medications - No data to display  ED Course/ Medical Decision Making/ A&P Clinical Course as of 01/13/23 1330  Thu Jan 13, 2023  1301 Discussed the case with urology, they agree the patient can have a coud catheter and follow-up outpatient [BM]    Clinical Course User Index [BM] Eber Hong, MD                                 Medical Decision Making Amount and/or Complexity of Data Reviewed Labs: ordered.   I did discuss the care with urology prior to placing catheter, this was done successfully, I was asked to assist as nurse could not complete task due to resistance being met, I found that with gentle pressure the catheter was able to be advanced.        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

## 2023-01-14 DIAGNOSIS — R339 Retention of urine, unspecified: Secondary | ICD-10-CM | POA: Insufficient documentation

## 2023-01-14 DIAGNOSIS — N319 Neuromuscular dysfunction of bladder, unspecified: Secondary | ICD-10-CM | POA: Insufficient documentation

## 2023-01-14 DIAGNOSIS — G8220 Paraplegia, unspecified: Secondary | ICD-10-CM | POA: Insufficient documentation

## 2023-01-14 DIAGNOSIS — Z789 Other specified health status: Secondary | ICD-10-CM | POA: Insufficient documentation

## 2023-01-14 LAB — URINE CULTURE: Culture: <10000 — AB
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
## 2023-01-18 ENCOUNTER — Ambulatory Visit: Payer: 59 | Admitting: Urology

## 2023-01-18 DIAGNOSIS — Z789 Other specified health status: Secondary | ICD-10-CM

## 2023-01-18 DIAGNOSIS — N35014 Post-traumatic urethral stricture, male, unspecified: Secondary | ICD-10-CM

## 2023-01-18 DIAGNOSIS — N319 Neuromuscular dysfunction of bladder, unspecified: Secondary | ICD-10-CM

## 2023-01-18 DIAGNOSIS — R339 Retention of urine, unspecified: Secondary | ICD-10-CM

## 2023-01-19 ENCOUNTER — Ambulatory Visit: Payer: 59 | Admitting: Urology

## 2023-01-19 VITALS — BP 159/81 | HR 76

## 2023-01-19 DIAGNOSIS — N35919 Unspecified urethral stricture, male, unspecified site: Secondary | ICD-10-CM | POA: Diagnosis not present

## 2023-01-19 MED ORDER — CLOTRIMAZOLE-BETAMETHASONE 1-0.05 % EX CREA: 1.0000 | TOPICAL_CREAM | Freq: Two times a day (BID) | CUTANEOUS | 3 refills | Status: AC

## 2023-01-19 NOTE — Progress Notes (Signed)
Catheter Removal  Patient is present today for a catheter removal.  10ml of water was drained from the balloon. A 16FR foley cath was removed from the bladder, no complications were noted. Patient tolerated well.  Performed by: Guss Bunde, CMA  Continuous Intermittent Catheterization  Due to urinary retemtion patient is present today for a teaching of self I & O Catheterization. Patient was given detailed verbal and printed instructions of self catheterization. Patient was cleaned and prepped in a sterile fashion.  With instruction and assistance patient inserted a 14FR and urine return was noted 10 ml, urine was yellow in color. Patient tolerated well, no complications were noted Patient has CIC cath supplies at home.   Performed by: Guss Bunde, CMA

## 2023-01-19 NOTE — Progress Notes (Signed)
01/19/2023 3:35 PM   [name] [birth year] [id number]  Referring provider: Eber Hong, MD [address] [place],  [place] [address]  Urinary retention   HPI: [name] is a 62yo here for evaluation for urinary retention and urethral stricture. 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 He presented to the ER 1 week ago with difficulty performing CIC and had a foley placed. He denies nay hematuria currently.  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   Substance abuse (HCC)    drinking,     Surgical History: Past Surgical History:  Procedure Laterality Date   BACK SURGERY     BALLOON DILATION N/A 08/11/2016   Procedure: BALLOON DILATION;  Surgeon: Malissa Hippo, MD;  Location: [facility];  Service: Endoscopy;  Laterality: N/A;   BOWEL RESECTION N/A 07/02/2021   Procedure: SMALL BOWEL RESECTION;  Surgeon: Lewie Chamber, DO;  Location: [facility];  Service: General;  Laterality: N/A;   CHOLECYSTECTOMY     ERCP N/A 08/11/2016   Procedure: ENDOSCOPIC RETROGRADE CHOLANGIOPANCREATOGRAPHY (ERCP) extended Sphincterotomy;  Surgeon: Malissa Hippo, MD;  Location: [facility];  Service: Endoscopy;  Laterality: N/A;   FEMUR FRACTURE SURGERY  March 2012   retrograde intramedullary femoral nail   FRACTURE SURGERY     broke back, fusion   GALLBLADDER SURGERY  2010   HERNIA REPAIR     very young   LAPAROTOMY N/A 07/02/2021   Procedure: EXPLORATORY LAPAROTOMY;   Surgeon: Lewie Chamber, DO;  Location: [facility];  Service: General;  Laterality: N/A;   LYSIS OF ADHESION N/A 07/02/2021   Procedure: EXTENSIVE LYSIS OF ADHESIONS;  Surgeon: Lewie Chamber, DO;  Location: [facility];  Service: General;  Laterality: N/A;   ORIF FEMUR FRACTURE Right 08/04/2012   Procedure: OPEN REDUCTION INTERNAL FIXATION (ORIF) DISTAL FEMUR FRACTURE;  Surgeon: Eldred Manges, MD;  Location: [facility];  Service: Orthopedics;  Laterality: Right;  Right Retrograde Supracondylar Nail Right Femur    Home Medications:  Allergies as of 01/19/2023   No Known Allergies      Medication List        Accurate as of January 19, 2023  3:35 PM. If you have any questions, ask your nurse or doctor.          cephALEXin 500 MG capsule Commonly known as: KEFLEX Take 1 capsule (500 mg total) by mouth 3 (three) times daily for 7 days.   doxycycline 100 MG capsule Commonly known as: VIBRAMYCIN Take 1 capsule (100 mg total) by mouth 2 (two) times daily.   DSS 100 MG Caps Take 1 capsule by mouth 2 (two) times daily.   ibuprofen 600 MG tablet Commonly known as: ADVIL Take 1 tablet (600 mg total) by mouth every 8 (eight) hours as needed.  methadone 10 MG tablet Commonly known as: DOLOPHINE Take 20 mg by mouth 3 (three) times daily.   naloxone 4 MG/0.1ML Liqd nasal spray kit Commonly known as: NARCAN as directed.   pregabalin 75 MG capsule Commonly known as: LYRICA Take 75 mg by mouth 3 (three) times daily.        Allergies: No Known Allergies  Family History: Family History  Problem Relation Age of Onset   Ovarian cancer Mother    HIV Sister    Healthy Sister    Hodgkin's lymphoma Sister    Heart disease Sister    Healthy Sister    Healthy Son    Healthy Daughter    Anemia Daughter    Thyroid disease Daughter    Cancer Other        family history     Social History:  reports that he quit smoking about 12 years ago. His smoking use included  cigarettes. He started smoking about 17 years ago. He has a 5 pack-year smoking history. He has never used smokeless tobacco. He reports that he does not currently use alcohol. He reports that he does not use drugs.  ROS: All other review of systems were reviewed and are negative except what is noted above in HPI  Physical Exam: BP (!) 159/81   Pulse 76   Constitutional:  Alert and oriented, No acute distress. HEENT: [name] AT, moist mucus membranes.  Trachea midline, no masses. Cardiovascular: No clubbing, cyanosis, or edema. Respiratory: Normal respiratory effort, no increased work of breathing. GI: Abdomen is soft, nontender, nondistended, no abdominal masses GU: No CVA tenderness.  Lymph: No cervical or inguinal lymphadenopathy. Skin: No rashes, bruises or suspicious lesions. Neurologic: Grossly intact, no focal deficits, moving all 4 extremities. Psychiatric: Normal mood and affect.  Laboratory Data: Lab Results  Component Value Date   WBC 3.5 (L) 09/13/2022   HGB 10.0 (L) 09/13/2022   HCT 33.6 (L) 09/13/2022   MCV 77.1 (L) 09/13/2022   PLT 310 09/13/2022    Lab Results  Component Value Date   CREATININE 0.62 09/13/2022    No results found for: "PSA"  No results found for: "TESTOSTERONE"  No results found for: "HGBA1C"  Urinalysis    Component Value Date/Time   COLORURINE YELLOW 01/13/2023 1234   APPEARANCEUR CLEAR 01/13/2023 1234   LABSPEC 1.015 01/13/2023 1234   PHURINE 5.0 01/13/2023 1234   GLUCOSEU NEGATIVE 01/13/2023 1234   HGBUR SMALL (A) 01/13/2023 1234   BILIRUBINUR NEGATIVE 01/13/2023 1234   KETONESUR NEGATIVE 01/13/2023 1234   PROTEINUR NEGATIVE 01/13/2023 1234   UROBILINOGEN 4.0 (H) 12/27/2013 1338   NITRITE NEGATIVE 01/13/2023 1234   LEUKOCYTESUR NEGATIVE 01/13/2023 1234    Lab Results  Component Value Date   BACTERIA NONE SEEN 01/13/2023    Pertinent Imaging:  Results for orders placed during the hospital encounter of 06/27/21  DG Abd 1  View  Narrative CLINICAL DATA:  Postoperative ileus.  EXAM: ABDOMEN - 1 VIEW  COMPARISON:  July 05, 2021.  FINDINGS: The bowel gas pattern is normal. Status post cholecystectomy. Surgical staples are seen in left lower quadrant.  IMPRESSION: No abnormal bowel dilatation is noted.   Electronically Signed By: Lupita Raider M.D. On: 07/07/2021 08:20  No results found for this or any previous visit.  No results found for this or any previous visit.  No results found for this or any previous visit.  No results found for this or any previous visit.  No valid procedures  specified. No results found for this or any previous visit.  No results found for this or any previous visit.   Assessment & Plan:    1. Stricture of male urethra, unspecified stricture type -Foley removed today and patient was able to perform CIC without issue. He will followup yearly   No follow-ups on file.  Wilkie Aye, MD  [facility] [place]

## 2023-01-25 ENCOUNTER — Encounter: Payer: Self-pay | Admitting: Urology

## 2023-01-25 NOTE — Patient Instructions (Signed)
Neurogenic Bladder  Neurogenic bladder is a bladder control disorder. It is usually caused by problems with the nerves that control the bladder. The brain sends signals through the spinal cord to the muscles in the bladder that start and stop urine flow. With neurogenic bladder, the nerves and muscles do not work together the way they should. This condition may make the bladder overactive, meaning you have trouble holding urine. In other cases, it may make the bladder underactive. This means that you have trouble passing urine. What are the causes? This condition may be caused by nerve damage or a condition that disrupts the signals from your brain to your bladder. Many things can cause these nerve problems, including: A disease that affects the nervous system, such as: Alzheimer's disease. Cerebral palsy. Multiple sclerosis. Diabetes. Parkinson's disease. Damage to your brain or spinal cord. This can come from: Trauma. Tumors. Infection. Surgery. Alcohol abuse. Stroke. A congenital disability that affects the spinal cord. What increases the risk? You are more likely to develop this condition if you have nerve damage or a nerve disorder. What are the signs or symptoms? Signs and symptoms of this condition include: Leaking or gushing urine (incontinence). A sudden, strong urge to pass urine (urgency). Frequent urination during the day and night. Being unable to empty your bladder completely (urinary retention). Frequent urinary tract infections. How is this diagnosed? This condition may be diagnosed based on: Your symptoms and medical history. A physical exam. Records from a bladder diary. You may be asked to keep a record or log of your bladder symptoms and the times that you urinate. You may also have tests, such as: A urine test to check for infection. A bladder scan after you urinate to see how much urine is left in your bladder. Tests to measure your urine flow and see how  well the flow is controlled (urodynamic tests). A procedure that uses a small device with a camera to look through your urethra into your bladder (cystoscopy). A health care provider who specializes in the urinary tract (urologist) may do this test. Imaging tests of your brain or spine, such as MRI or CT scan. How is this treated? Treatment for this condition depends on the cause and the symptoms that you have. Work closely with your health care provider to find the treatments that will improve your quality of life. Treatment options include: Learning ways to control when you urinate, such as: Urinating at scheduled times. Training yourself to delay urination. Exercises to strengthen the muscles that control urine flow (Kegel exercises). Avoiding foods or drinks that make your symptoms worse. Taking medicines to: Stimulate an underactive bladder. Relax an overactive bladder. Treat a urinary tract infection. Learning how to use a thin tube (catheter) to empty your bladder. A catheter is a hollow tube that you pass through your urethra. Procedures to stimulate the nerves that control your bladder. Surgery, if other treatments do not help. Follow these instructions at home: Lifestyle  Keep a bladder diary to find out which foods, liquids, or activities make your symptoms worse. Use your bladder diary to schedule bathroom trips. If you are away from home, plan to be near a bathroom when your schedule says you will need one. Limit beverages that stimulate urination. These include soda, coffee, and tea. After urinating, wait a few minutes and try again. Make sure you urinate just before you leave the house and just before you go to bed. Kegel exercises Do Kegel exercises to strengthen the muscles  that control the passing of urine. These muscles are the ones you use to try to hold urine when you need to urinate. To do Kegel exercises: Squeeze your pelvic floor muscles tight, as if you are trying  to stop the flow of urine. You should feel a tight lift in your rectal area. If you are male, you should also feel a tightness in your vaginal area. Keep your stomach, buttocks, and legs relaxed. Hold the muscles tight for 5-10 seconds. Relax your muscles for the same amount of time. Repeat 10 times. Repeat this exercise 3 times a day or as many times as told by your health care provider. General instructions Take over-the-counter and prescription medicines only as told by your health care provider. Keep all follow-up visits. This is important. Contact a health care provider if: You are having a hard time controlling your symptoms. Your symptoms are getting worse. You have signs of a urinary tract infection. These may include: A burning feeling when you urinate. Fever or chills. Cloudy or bloody urine. Get help right away if: You cannot pass urine. Summary Neurogenic bladder is a bladder control disorder caused by problems with the nerves that control the bladder. This condition may make the bladder overactive or underactive. This condition may be caused by nerve damage or a condition that disrupts the signals from your brain to your bladder. Treatment depends on the cause of your neurogenic bladder and the symptoms that you have. Work closely with your health care provider to find the treatments that will improve your quality of life. This information is not intended to replace advice given to you by your health care provider. Make sure you discuss any questions you have with your health care provider. Document Revised: 11/08/2019 Document Reviewed: 11/08/2019 Elsevier Patient Education  2024 [employer].

## 2023-01-28 ENCOUNTER — Telehealth: Payer: Self-pay | Admitting: Urology

## 2023-01-28 NOTE — Telephone Encounter (Signed)
I returned call with no answer, left a vm to call back.

## 2023-01-28 NOTE — Telephone Encounter (Signed)
Pt states he is having problems with his catheter. He is paraplegic

## 2023-02-07 ENCOUNTER — Telehealth: Payer: Self-pay | Admitting: Urology
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
## 2023-02-07 NOTE — Telephone Encounter (Signed)
Had uti and took antibiotic. States the uti is back and wants to know if we can call in a refill. He does not want to end up in the hospital.

## 2023-02-07 NOTE — Telephone Encounter (Signed)
Patient called and accepted appointment with NP.

## 2023-02-08 ENCOUNTER — Encounter: Payer: Self-pay | Admitting: Urology

## 2023-02-08 ENCOUNTER — Ambulatory Visit (INDEPENDENT_AMBULATORY_CARE_PROVIDER_SITE_OTHER): Payer: 59 | Admitting: Urology

## 2023-02-08 VITALS — BP 124/83 | HR 98 | Temp 98.1°F

## 2023-02-08 DIAGNOSIS — R339 Retention of urine, unspecified: Secondary | ICD-10-CM | POA: Diagnosis not present

## 2023-02-08 DIAGNOSIS — R399 Unspecified symptoms and signs involving the genitourinary system: Secondary | ICD-10-CM | POA: Diagnosis not present

## 2023-02-08 DIAGNOSIS — Z789 Other specified health status: Secondary | ICD-10-CM

## 2023-02-08 DIAGNOSIS — N319 Neuromuscular dysfunction of bladder, unspecified: Secondary | ICD-10-CM

## 2023-02-08 MED ORDER — SULFAMETHOXAZOLE-TRIMETHOPRIM 800-160 MG PO TABS: 1.0000 | ORAL_TABLET | Freq: Two times a day (BID) | ORAL | 0 refills | Status: DC

## 2023-02-09 LAB — MICROSCOPIC EXAMINATION

## 2023-02-09 LAB — URINALYSIS, ROUTINE W REFLEX MICROSCOPIC
Bilirubin, UA: NEGATIVE
Glucose, UA: NEGATIVE
Ketones, UA: NEGATIVE
Nitrite, UA: NEGATIVE
Protein,UA: NEGATIVE
Specific Gravity, UA: 1.02 (ref 1.005–1.030)
Urobilinogen, Ur: 1 mg/dL (ref 0.2–1.0)
pH, UA: 7.5 (ref 5.0–7.5)

## 2023-02-11 ENCOUNTER — Telehealth: Payer: Self-pay

## 2023-02-11 LAB — URINE CULTURE

## 2023-02-11 NOTE — Telephone Encounter (Signed)
Patient is made aware and voiced understanding. Appointment reminder mailed out.

## 2023-02-11 NOTE — Telephone Encounter (Signed)
-----   Message from [name] sent at 02/11/2023  8:24 AM EST ----- Please notify patient: Negative urine culture, no antibiotic needed at this time. Can discontinue the Bactrim. F/u as discussed for cysto with Dr. Ronne Binning. Thanks.

## 2023-02-15 DIAGNOSIS — R252 Cramp and spasm: Secondary | ICD-10-CM | POA: Insufficient documentation

## 2023-02-21 ENCOUNTER — Telehealth: Payer: Self-pay | Admitting: Urology

## 2023-02-21 ENCOUNTER — Ambulatory Visit: Payer: 59

## 2023-02-21 NOTE — Telephone Encounter (Signed)
Patient returned call stating he is having trouble catheterizing himself. Patient denies pain and states he is paralyzed from the waist down.  Patient offered an appointment to come in this afternoon to have an indwelling catheter placed until office visit with MD. Patient accepted.

## 2023-02-21 NOTE — Telephone Encounter (Signed)
Thinks he has a blockage, the catheter is not wanting to go in and he has blood and is in pain. Please advise what he can do

## 2023-02-21 NOTE — Telephone Encounter (Signed)
Returned call to patient with no answer. Message left to return call to office. 

## 2023-03-03 ENCOUNTER — Encounter (HOSPITAL_BASED_OUTPATIENT_CLINIC_OR_DEPARTMENT_OTHER): Payer: 59 | Attending: Internal Medicine | Admitting: General Surgery
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
## 2023-03-31 ENCOUNTER — Ambulatory Visit: Payer: 59 | Admitting: Urology

## 2023-03-31 ENCOUNTER — Encounter: Payer: Self-pay | Admitting: Urology

## 2023-03-31 VITALS — BP 147/90 | HR 116 | Temp 98.6°F

## 2023-03-31 DIAGNOSIS — R339 Retention of urine, unspecified: Secondary | ICD-10-CM | POA: Diagnosis not present

## 2023-03-31 DIAGNOSIS — N319 Neuromuscular dysfunction of bladder, unspecified: Secondary | ICD-10-CM | POA: Diagnosis not present

## 2023-03-31 DIAGNOSIS — N35919 Unspecified urethral stricture, male, unspecified site: Secondary | ICD-10-CM | POA: Diagnosis not present

## 2023-03-31 MED ORDER — TAMSULOSIN HCL 0.4 MG PO CAPS: 0.4000 mg | ORAL_CAPSULE | Freq: Every day | ORAL | 2 refills | Status: AC

## 2023-04-06 ENCOUNTER — Ambulatory Visit (INDEPENDENT_AMBULATORY_CARE_PROVIDER_SITE_OTHER): Payer: 59 | Admitting: Urology

## 2023-04-06 ENCOUNTER — Encounter: Payer: Self-pay | Admitting: Urology

## 2023-04-06 VITALS — BP 111/75 | HR 112

## 2023-04-06 DIAGNOSIS — N35919 Unspecified urethral stricture, male, unspecified site: Secondary | ICD-10-CM | POA: Diagnosis not present

## 2023-04-06 DIAGNOSIS — N319 Neuromuscular dysfunction of bladder, unspecified: Secondary | ICD-10-CM | POA: Diagnosis not present

## 2023-04-06 MED ORDER — MIRABEGRON ER 50 MG PO TB24: 50.0000 mg | ORAL_TABLET | Freq: Every day | ORAL | 11 refills | Status: AC

## 2023-04-06 NOTE — Progress Notes (Signed)
Catheter Removal  Patient is present today for a catheter removal.  10ml of water was drained from the balloon. A 16FR foley cath was removed from the bladder, no complications were noted. Patient tolerated well.  Performed by: Guss Bunde, CMA  Follow up/ Additional notes: MD to see after

## 2023-04-06 NOTE — Progress Notes (Signed)
04/06/2023 9:27 AM   [name] Aug 11, 1960 [id number]  Referring provider: Kara Pacer, NP [address] [place],  [place] [address]  Followup urethral stricture   HPI: [name] is a 63yo here for followup for neurogenic bladder and urethral stricture. He had a a 20 french foley placed after urethral dilation with Dr. Annabell Howells. He has issues with incontinence between catheterizations. He performs CIC 5-8x. Daily. No hx of anticholinergic or Beta 3 therapy.  He has issues with constipation.   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   Substance abuse (HCC)    drinking,     Surgical History: Past Surgical History:  Procedure Laterality Date   BACK SURGERY     BALLOON DILATION N/A 08/11/2016   Procedure: BALLOON DILATION;  Surgeon: Malissa Hippo, MD;  Location: [facility];  Service: Endoscopy;  Laterality: N/A;   BOWEL RESECTION N/A 07/02/2021   Procedure: SMALL BOWEL RESECTION;  Surgeon: Lewie Chamber, DO;  Location: [facility];  Service: General;  Laterality: N/A;   CHOLECYSTECTOMY     ERCP N/A 08/11/2016   Procedure: ENDOSCOPIC RETROGRADE CHOLANGIOPANCREATOGRAPHY (ERCP) extended Sphincterotomy;  Surgeon: Malissa Hippo, MD;  Location: [facility];  Service: Endoscopy;  Laterality: N/A;   FEMUR FRACTURE SURGERY  March 2012   retrograde intramedullary femoral nail   FRACTURE SURGERY     broke back, fusion   GALLBLADDER SURGERY  2010   HERNIA REPAIR     very young   LAPAROTOMY N/A 07/02/2021   Procedure: EXPLORATORY LAPAROTOMY;   Surgeon: Lewie Chamber, DO;  Location: [facility];  Service: General;  Laterality: N/A;   LYSIS OF ADHESION N/A 07/02/2021   Procedure: EXTENSIVE LYSIS OF ADHESIONS;  Surgeon: Lewie Chamber, DO;  Location: [facility];  Service: General;  Laterality: N/A;   ORIF FEMUR FRACTURE Right 08/04/2012   Procedure: OPEN REDUCTION INTERNAL FIXATION (ORIF) DISTAL FEMUR FRACTURE;  Surgeon: Eldred Manges, MD;  Location: [facility];  Service: Orthopedics;  Laterality: Right;  Right Retrograde Supracondylar Nail Right Femur    Home Medications:  Allergies as of 04/06/2023   No Known Allergies      Medication List        Accurate as of April 06, 2023  9:27 AM. If you have any questions, ask your nurse or doctor.          clotrimazole-betamethasone cream Commonly known as: LOTRISONE Apply 1 Application topically 2 (two) times daily.   DSS 100 MG Caps Take 1 capsule by mouth 2 (two) times daily.   ibuprofen 600 MG tablet Commonly known as: ADVIL Take 1 tablet (600 mg total) by mouth every 8 (eight) hours as needed.   methadone 10 MG tablet Commonly known as: DOLOPHINE Take 20 mg by mouth 3 (three) times daily.   naloxone 4 MG/0.1ML Liqd nasal spray kit Commonly known as: NARCAN as directed.   pregabalin 75 MG capsule Commonly  known as: LYRICA Take 75 mg by mouth 3 (three) times daily.   tamsulosin 0.4 MG Caps capsule Commonly known as: FLOMAX Take 1 capsule (0.4 mg total) by mouth daily.        Allergies: No Known Allergies  Family History: Family History  Problem Relation Age of Onset   Ovarian cancer Mother    HIV Sister    Healthy Sister    Hodgkin's lymphoma Sister    Heart disease Sister    Healthy Sister    Healthy Son    Healthy Daughter    Anemia Daughter    Thyroid disease Daughter    Cancer Other        family history     Social History:  reports that he quit smoking about 12 years ago. His smoking use included cigarettes. He started smoking about  17 years ago. He has a 5 pack-year smoking history. He has never used smokeless tobacco. He reports that he does not currently use alcohol. He reports that he does not use drugs.  ROS: All other review of systems were reviewed and are negative except what is noted above in HPI  Physical Exam: BP 111/75   Pulse (!) 112   Constitutional:  Alert and oriented, No acute distress. HEENT: [place] AT, moist mucus membranes.  Trachea midline, no masses. Cardiovascular: No clubbing, cyanosis, or edema. Respiratory: Normal respiratory effort, no increased work of breathing. GI: Abdomen is soft, nontender, nondistended, no abdominal masses GU: No CVA tenderness.  Lymph: No cervical or inguinal lymphadenopathy. Skin: No rashes, bruises or suspicious lesions. Neurologic: Grossly intact, no focal deficits, moving all 4 extremities. Psychiatric: Normal mood and affect.  Laboratory Data: Lab Results  Component Value Date   WBC 3.5 (L) 09/13/2022   HGB 10.0 (L) 09/13/2022   HCT 33.6 (L) 09/13/2022   MCV 77.1 (L) 09/13/2022   PLT 310 09/13/2022    Lab Results  Component Value Date   CREATININE 0.62 09/13/2022    No results found for: "PSA"  No results found for: "TESTOSTERONE"  No results found for: "HGBA1C"  Urinalysis    Component Value Date/Time   COLORURINE YELLOW 01/13/2023 1234   APPEARANCEUR Clear 02/08/2023 1615   LABSPEC 1.015 01/13/2023 1234   PHURINE 5.0 01/13/2023 1234   GLUCOSEU Negative 02/08/2023 1615   HGBUR SMALL (A) 01/13/2023 1234   BILIRUBINUR Negative 02/08/2023 1615   KETONESUR NEGATIVE 01/13/2023 1234   PROTEINUR Negative 02/08/2023 1615   PROTEINUR NEGATIVE 01/13/2023 1234   UROBILINOGEN 4.0 (H) 12/27/2013 1338   NITRITE Negative 02/08/2023 1615   NITRITE NEGATIVE 01/13/2023 1234   LEUKOCYTESUR 1+ (A) 02/08/2023 1615   LEUKOCYTESUR NEGATIVE 01/13/2023 1234    Lab Results  Component Value Date   LABMICR See below: 02/08/2023   WBCUA 11-30 (A) 02/08/2023    LABEPIT 0-10 02/08/2023   BACTERIA Few (A) 02/08/2023    Pertinent Imaging:  Results for orders placed during the hospital encounter of 06/27/21  DG Abd 1 View  Narrative CLINICAL DATA:  Postoperative ileus.  EXAM: ABDOMEN - 1 VIEW  COMPARISON:  July 05, 2021.  FINDINGS: The bowel gas pattern is normal. Status post cholecystectomy. Surgical staples are seen in left lower quadrant.  IMPRESSION: No abnormal bowel dilatation is noted.   Electronically Signed By: Lupita Raider M.D. On: 07/07/2021 08:20  No results found for this or any previous visit.  No results found for this or any previous visit.  No results found for this or  any previous visit.  No results found for this or any previous visit.  No results found for this or any previous visit.  No results found for this or any previous visit.  No results found for this or any previous visit.   Assessment & Plan:    1. Stricture of male urethra, unspecified stricture type (Primary) Continue CIC everu 3-4 hours - Foley catheter - discontinue  2. Neurogenic bladder Mirabegron 50mg  daily   No follow-ups on file.  Wilkie Aye, MD  [facility] [place]

## 2023-04-06 NOTE — Patient Instructions (Signed)
Nerve Problems With the Bladder (Neurogenic Bladder): What to Know  Neurogenic bladder is a problem with the nerves that help you pee. Your brain sends signals to the nerves and muscles in your bladder to start and stop the flow of pee. If you have neurogenic bladder, these nerves and muscles don't work the way they should. This may mean that your bladder is: Overactive. This means you have trouble holding your pee. Underactive. This means you have trouble peeing. What are the causes? Nerve problems with your bladder may be caused by nerve damage or by a condition that changes the signals from your brain to your bladder. Many things can cause this. They include: A disease, such as: Cerebral palsy. Multiple sclerosis. Parkinson's disease. Damage to your brain or spinal cord. This can come from: Tumors. These are growths of cells that aren't normal. An infection. Surgery. Drinking a lot of alcohol. A stroke. What increases the risk? You're more likely to get neurogenic bladder if you have: Nerve damage. A nerve disease that you were born with. What are the signs or symptoms? Leaking or gushing pee. A sudden, strong need to pee. Peeing a lot during the day and night. Not being able to pee fully. A lot of urinary tract infections (UTIs). How is this diagnosed? You may be diagnosed based on your symptoms, medical history, and an exam. You may be asked to keep a log of your symptoms and when you pee. You may also have tests. These may include: A test of your pee to check for an infection. A scan of your bladder. This checks how much pee is left in your bladder after you pee. Tests to check the flow of your pee. These are called urodynamic tests. A test to look at your bladder with a camera. Imaging tests of your brain or spine, such as: An MRI. A CT scan. You may also need to see an expert in treating problems with the bladder called a urologist. How is this treated? Treatment  depends on what caused the nerve problems and what symptoms you have. Work closely with your health care provider to find the best treatments for you. They may include: Learning ways to control when you pee, such as: Peeing at set times. Training yourself to delay peeing. Doing exercises to make the muscles in your bladder stronger. These are called Kegel exercises. Staying away from foods and drinks that make your symptoms worse. Taking medicines to: Make an underactive bladder work. Calm an overactive bladder. Treat a UTI. Using a tube called a catheter to empty your bladder. Having surgery to help the nerves that control your bladder. Follow these instructions at home: Lifestyle Keep a diary of what foods, drinks, and other things make your symptoms worse. Use your diary to set times that you'll try to pee. If you're away from home, plan to be near a bathroom when needed. Limit drinks that can make you pee. These include soda, coffee, and tea. After you pee, wait a minute and try again. Make sure you pee just before you leave the house and just before you go to bed. Kegel exercises Do Kegel exercises to help make your bladder muscles stronger. These are the muscles that you use to try to hold it when you need to pee. To do the exercises: Squeeze the muscles tight, as if you're trying to stop peeing. You should feel your muscles lift. If you're male, you may also feel a lift in the area around  your vagina. Keep your belly, butt, and legs relaxed. Hold the muscles tight for 5-10 seconds. Relax your muscles for 5-10 seconds. Repeat 10 times. Do this exercise 3 times a day or as many times as told. General instructions Take your medicines only as told. Contact a health care provider if: Your symptoms don't get better. Your symptoms get worse. You have signs of a UTI. These may include: A burning feeling when you pee. Fever or chills. Cloudy or bloody pee. Get help right away  if: You can't pee. This information is not intended to replace advice given to you by your health care provider. Make sure you discuss any questions you have with your health care provider. Document Revised: 09/20/2022 Document Reviewed: 09/20/2022 Elsevier Patient Education  2024 [employer].

## 2023-04-13 ENCOUNTER — Encounter (HOSPITAL_COMMUNITY): Payer: Self-pay

## 2023-04-13 ENCOUNTER — Other Ambulatory Visit: Payer: Self-pay

## 2023-04-13 ENCOUNTER — Emergency Department (HOSPITAL_COMMUNITY): Payer: 59

## 2023-04-13 ENCOUNTER — Emergency Department (HOSPITAL_COMMUNITY)
Admission: EM | Admit: 2023-04-13 | Discharge: 2023-04-13 | Disposition: A | Discharge status: Home / Self Care | Payer: 59 | Attending: Emergency Medicine | Admitting: Emergency Medicine

## 2023-04-13 DIAGNOSIS — M79661 Pain in right lower leg: Secondary | ICD-10-CM | POA: Diagnosis not present

## 2023-04-13 DIAGNOSIS — R600 Localized edema: Secondary | ICD-10-CM | POA: Diagnosis not present

## 2023-04-13 DIAGNOSIS — R531 Weakness: Secondary | ICD-10-CM | POA: Diagnosis not present

## 2023-04-13 DIAGNOSIS — M25561 Pain in right knee: Secondary | ICD-10-CM | POA: Diagnosis not present

## 2023-04-13 DIAGNOSIS — M79604 Pain in right leg: Secondary | ICD-10-CM | POA: Diagnosis present

## 2023-04-13 MED ORDER — IBUPROFEN 600 MG PO TABS: 600.0000 mg | ORAL_TABLET | Freq: Three times a day (TID) | ORAL | 0 refills | Status: AC

## 2023-04-13 NOTE — ED Triage Notes (Signed)
 Pt arrived via POV c/o right leg pain that began yesterday. Pt reports leg was swollen yesterday but swelling has resolved. Pt concerned of possible fracture. Pt denies falls or injuries.

## 2023-04-13 NOTE — Discharge Instructions (Signed)
 Your x-rays are negative for any acute bony injuries and your ultrasound is negative for a blood clot in your leg, call the DVT.  I suspect your symptoms are secondary to musculoskeletal overuse from having to change your flat tire yesterday.  I do recommend taking ibuprofen  to help you with your pain.  Plan to see your primary doctor for recheck if your symptoms are not improving over the next week.

## 2023-04-15 NOTE — ED Provider Notes (Signed)
 [place] EMERGENCY DEPARTMENT AT [facility] Provider Note   CSN: [id number] Arrival date & time: 04/13/23  1230     History  Chief Complaint  Patient presents with   Leg Pain    [name] is a 63 y.o. 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  He sustained a flat tire on his vehicle yesterday and after great difficulty was able to get the tire changed, however at 1 point in time had to actually lift the tire onto his right knee causing significant pain around the knee, but also has noted swelling in his right calf since yesterday.  He has a rod in his right femur secondary to femur fracture and is concerned he may have disrupted this hardware.  He is also concerned about development of blood clot in the calf because of the swelling.  He is not on blood thinners at baseline.  He is elevated the leg and apply ice without improvement in his pain although he endorses the swelling in his calf is somewhat better today.  He is on Lyrica  and methadone  for chronic pain.   The history is provided by the patient.       Home Medications Prior to Admission medications   Medication Sig Start Date End Date Taking? Authorizing Provider  ibuprofen  (ADVIL ) 600 MG tablet Take 1 tablet (600 mg total) by mouth 3 (three) times daily. 04/13/23  Yes Daniel Johndrow, PA-C  clotrimazole -betamethasone  (LOTRISONE ) cream Apply 1 Application topically 2 (two) times daily. 01/19/23   McKenzie, Belvie CROME, MD  Docusate Sodium  (DSS) 100 MG CAPS Take 1 capsule by mouth 2 (two) times daily. 03/17/11   [provider]  methadone  (DOLOPHINE ) 10 MG tablet Take 20 mg by mouth 3 (three) times daily.    [provider]  mirabegron  ER (MYRBETRIQ ) 50 MG TB24 tablet Take 1 tablet (50 mg total) by mouth daily. 04/06/23   McKenzie, Belvie CROME, MD  naloxone  [facility]) nasal spray 4 mg/0.1 mL as directed. 04/10/21   [provider]  pregabalin  (LYRICA ) 75 MG capsule Take 75 mg by mouth 3 (three) times daily.     [provider]  tamsulosin  (FLOMAX ) 0.4 MG CAPS capsule Take 1 capsule (0.4 mg total) by mouth daily. 03/31/23   Larocco, Sarah C, FNP      Allergies    Patient has no known allergies.    Review of Systems   Review of Systems  Constitutional:  Negative for fever.  Respiratory: Negative.  Negative for shortness of breath.   Cardiovascular:  Negative for chest pain.  Musculoskeletal:  Positive for arthralgias and joint swelling. Negative for myalgias.  Skin:  Negative for color change.  Neurological:  Negative for weakness and numbness.  All other systems reviewed and are negative.   Physical Exam Updated Vital Signs BP (!) 148/102 (BP Location: Left Arm)   Pulse 100   Temp 98.1 F (36.7 C) (Oral)   Resp 16   Ht 5' 9 (1.753 m)   Wt 68 kg   SpO2 99%   BMI 22.14 kg/m  Physical Exam Constitutional:      Appearance: He is well-developed.  HENT:     Head: Atraumatic.  Cardiovascular:     Comments: Pulses equal bilaterally Musculoskeletal:        General: Swelling and tenderness present. No deformity.  Cervical back: Normal range of motion.     Right knee: No deformity, erythema or crepitus. Decreased range of motion. Tenderness present. Normal alignment.     Right lower leg: Edema present.     Comments: Muscle wasting of lower extremities appreciated.  He is tender to palpation along his distal anterior femur, without palpable deformity.  There is tender soft edema along his right lateral mid calf.  No erythema, no palpable cords.  Skin:    General: Skin is warm and dry.     Findings: No erythema.  Neurological:     Mental Status: He is alert.     Sensory: Sensory deficit present.     Motor: Weakness and atrophy present.     Comments: Baseline sensory deficit in lower extremities.     ED Results /  Procedures / Treatments   Labs (all labs ordered are listed, but only abnormal results are displayed) Labs Reviewed - No data to display  EKG None  Radiology No results found. Results for orders placed or performed in visit on 02/08/23  Microscopic Examination   Collection Time: 02/08/23  4:15 PM   Urine  Result Value Ref Range   WBC, UA 11-30 (A) 0 - 5 /hpf   RBC, Urine 3-10 (A) 0 - 2 /hpf   Epithelial Cells (non renal) 0-10 0 - 10 /hpf   Bacteria, UA Few (A) None seen/Few  Urinalysis, Routine w reflex microscopic   Collection Time: 02/08/23  4:15 PM  Result Value Ref Range   Specific Gravity, UA 1.020 1.005 - 1.030   pH, UA 7.5 5.0 - 7.5   Color, UA Yellow Yellow   Appearance Ur Clear Clear   Leukocytes,UA 1+ (A) Negative   Protein,UA Negative Negative/Trace   Glucose, UA Negative Negative   Ketones, UA Negative Negative   RBC, UA Trace (A) Negative   Bilirubin, UA Negative Negative   Urobilinogen, Ur 1.0 0.2 - 1.0 mg/dL   Nitrite, UA Negative Negative   Microscopic Examination See below:   Urine Culture   Collection Time: 02/09/23  7:52 AM   Specimen: Urine   UR  Result Value Ref Range   Urine Culture, Routine Final report    Organism ID, Bacteria Comment    US  Venous Img Lower Right (DVT Study) Result Date: 04/13/2023 CLINICAL DATA:  Right lower extremity pain and edema. Evaluate for DVT. EXAM: RIGHT LOWER EXTREMITY VENOUS DOPPLER ULTRASOUND TECHNIQUE: Gray-scale sonography with graded compression, as well as color Doppler and duplex ultrasound were performed to evaluate the lower extremity deep venous systems from the level of the common femoral vein and including the common femoral, femoral, profunda femoral, popliteal and calf veins including the posterior tibial, peroneal and gastrocnemius veins when visible. The superficial great saphenous vein was also interrogated. Spectral Doppler was utilized to evaluate flow at rest and with distal augmentation maneuvers in  the common femoral, femoral and popliteal veins. COMPARISON:  None Available. FINDINGS: Contralateral Common Femoral Vein: Respiratory phasicity is normal and symmetric with the symptomatic side. No evidence of thrombus. Normal compressibility. Common Femoral Vein: No evidence of thrombus. Normal compressibility, respiratory phasicity and response to augmentation. Saphenofemoral Junction: No evidence of thrombus. Normal compressibility and flow on color Doppler imaging. Profunda Femoral Vein: No evidence of thrombus. Normal compressibility and flow on color Doppler imaging. Femoral Vein: No evidence of thrombus. Normal compressibility, respiratory phasicity and response to augmentation. Popliteal Vein: No evidence of thrombus. Normal compressibility, respiratory phasicity and response to augmentation. Calf  Veins: No evidence of thrombus. Normal compressibility and flow on color Doppler imaging. Superficial Great Saphenous Vein: No evidence of thrombus. Normal compressibility. Other Findings:  None. IMPRESSION: No evidence of DVT within the right lower extremity. Electronically Signed   By: Norleen Roulette M.D.   On: 04/13/2023 17:11   DG Foot Complete Left Result Date: 04/13/2023 CLINICAL DATA:  Injury. EXAM: LEFT FOOT - COMPLETE 3+ VIEW COMPARISON:  None Available. FINDINGS: There is diffuse osteopenia of the visualized osseous structures. No acute fracture or dislocation. No aggressive osseous lesion. Diffuse mild degenerative changes of imaged joints. No focal soft tissue swelling. No radiopaque foreign bodies. IMPRESSION: *No acute osseous abnormality of the left foot.  Osteopenia. Electronically Signed   By: Ree Molt M.D.   On: 04/13/2023 15:05   DG Knee Complete 4 Views Right Result Date: 04/13/2023 CLINICAL DATA:  809823 Fall [id number]; injury. EXAM: RIGHT FEMUR 2 VIEWS; RIGHT KNEE - COMPLETE 4+ VIEW COMPARISON:  None Available. FINDINGS: There is diffuse osteopenia of the visualized osseous structures. No  acute fracture or dislocation. No aggressive osseous lesion. Right femur intramedullary nail noted. The hardware is intact. No periprosthetic fracture or lucency. Moderate-to-severe degenerative changes of right hip and knee joints noted. No focal soft tissue swelling. No radiopaque foreign bodies. IMPRESSION: *No acute osseous abnormality of the right femur or knee. Electronically Signed   By: Ree Molt M.D.   On: 04/13/2023 14:45   DG FEMUR, MIN 2 VIEWS RIGHT Result Date: 04/13/2023 CLINICAL DATA:  809823 Fall 190176; injury. EXAM: RIGHT FEMUR 2 VIEWS; RIGHT KNEE - COMPLETE 4+ VIEW COMPARISON:  None Available. FINDINGS: There is diffuse osteopenia of the visualized osseous structures. No acute fracture or dislocation. No aggressive osseous lesion. Right femur intramedullary nail noted. The hardware is intact. No periprosthetic fracture or lucency. Moderate-to-severe degenerative changes of right hip and knee joints noted. No focal soft tissue swelling. No radiopaque foreign bodies. IMPRESSION: *No acute osseous abnormality of the right femur or knee. Electronically Signed   By: Ree Molt M.D.   On: 04/13/2023 14:45    Procedures Procedures    Medications Ordered in ED Medications - No data to display  ED Course/ Medical Decision Making/ A&P                                 Medical Decision Making Patient presenting with right lower extremity pain secondary to trauma incurred yesterday while he had to change a car tire while sitting in his wheelchair.  His plain film imaging is reassuring without any acute bone, obvious joint or hardware injuries.  He has some soft tissue swelling which is tender at his right lateral mid calf, venous ultrasound completed and negative for DVT.  Patient was given home instructions for care, elevation, gentle heat, continue with his other home pain medications, as needed follow-up is anticipated.  Amount and/or Complexity of Data Reviewed Radiology:  ordered.    Details: Plain film imaging reviewed and negative, ultrasound also reviewed, negative for DVT  Risk Prescription drug management.           Final Clinical Impression(s) / ED Diagnoses Final diagnoses:  Right leg pain    Rx / DC Orders ED Discharge Orders          Ordered    ibuprofen  (ADVIL ) 600 MG tablet  3 times daily        04/13/23 1723  Birdena Clarity, PA-C 04/15/23 2136    Suzette Pac, MD 04/18/23 1049

## 2023-04-21 ENCOUNTER — Telehealth: Payer: Self-pay | Admitting: Orthopedic Surgery

## 2023-04-21 NOTE — Telephone Encounter (Signed)
Spoke w/the pt, he stated he had to go to the ED at [facility] on 04/13/23 for his right leg.  Dr. Dallas Schimke was on call.  The patient only wants to see Dr. Romeo Apple.  He stated Dr. Romeo Apple did surgery on his lt leg.  He has had surgery on his right leg a few years ago w/someone in [place], but he doesn't know his name.  He's not sure he can get records, he is wheelchair bound.  Will you see him?  [phone]

## 2023-04-28 ENCOUNTER — Ambulatory Visit: Payer: 59 | Admitting: Orthopedic Surgery

## 2023-05-12 ENCOUNTER — Other Ambulatory Visit (HOSPITAL_COMMUNITY): Payer: Self-pay | Admitting: Adult Health

## 2023-05-12 DIAGNOSIS — R600 Localized edema: Secondary | ICD-10-CM

## 2023-05-23 ENCOUNTER — Encounter (HOSPITAL_COMMUNITY): Payer: Self-pay | Admitting: Adult Health

## 2023-05-23 ENCOUNTER — Other Ambulatory Visit (HOSPITAL_COMMUNITY): Payer: Self-pay | Admitting: Adult Health

## 2023-05-23 ENCOUNTER — Ambulatory Visit (HOSPITAL_COMMUNITY)
Admission: RE | Admit: 2023-05-23 | Discharge: 2023-05-23 | Disposition: A | Discharge status: Home / Self Care | Source: Ambulatory Visit | Attending: Adult Health | Admitting: Adult Health

## 2023-05-23 DIAGNOSIS — R600 Localized edema: Secondary | ICD-10-CM | POA: Diagnosis present

## 2023-07-06 ENCOUNTER — Ambulatory Visit: Payer: 59 | Admitting: Urology

## 2023-08-15 ENCOUNTER — Emergency Department (HOSPITAL_COMMUNITY)
Admission: EM | Admit: 2023-08-15 | Discharge: 2023-08-15 | Disposition: A | Discharge status: Home / Self Care | Attending: Emergency Medicine | Admitting: Emergency Medicine

## 2023-08-15 ENCOUNTER — Encounter (HOSPITAL_COMMUNITY): Payer: Self-pay

## 2023-08-15 ENCOUNTER — Other Ambulatory Visit: Payer: Self-pay

## 2023-08-15 DIAGNOSIS — Z87891 Personal history of nicotine dependence: Secondary | ICD-10-CM | POA: Insufficient documentation

## 2023-08-15 DIAGNOSIS — N3000 Acute cystitis without hematuria: Secondary | ICD-10-CM | POA: Insufficient documentation

## 2023-08-15 DIAGNOSIS — N390 Urinary tract infection, site not specified: Secondary | ICD-10-CM | POA: Diagnosis present

## 2023-08-15 LAB — CBC WITH DIFFERENTIAL/PLATELET
Abs Immature Granulocytes: 0.03 10*3/uL (ref 0.00–0.07)
Basophils Absolute: 0 10*3/uL (ref 0.0–0.1)
Basophils Relative: 0 %
Eosinophils Absolute: 0 10*3/uL (ref 0.0–0.5)
Eosinophils Relative: 0 %
HCT: 32.8 % — ABNORMAL LOW (ref 39.0–52.0)
Hemoglobin: 10.2 g/dL — ABNORMAL LOW (ref 13.0–17.0)
Immature Granulocytes: 0 %
Lymphocytes Relative: 9 %
Lymphs Abs: 0.9 10*3/uL (ref 0.7–4.0)
MCH: 22.9 pg — ABNORMAL LOW (ref 26.0–34.0)
MCHC: 31.1 g/dL (ref 30.0–36.0)
MCV: 73.5 fL — ABNORMAL LOW (ref 80.0–100.0)
Monocytes Absolute: 0.7 10*3/uL (ref 0.1–1.0)
Monocytes Relative: 7 %
Neutro Abs: 8.2 10*3/uL — ABNORMAL HIGH (ref 1.7–7.7)
Neutrophils Relative %: 84 %
Platelets: 338 10*3/uL (ref 150–400)
RBC: 4.46 MIL/uL (ref 4.22–5.81)
RDW: 17.1 % — ABNORMAL HIGH (ref 11.5–15.5)
WBC: 9.8 10*3/uL (ref 4.0–10.5)
nRBC: 0 % (ref 0.0–0.2)

## 2023-08-15 LAB — COMPREHENSIVE METABOLIC PANEL WITH GFR
ALT: 10 U/L (ref 0–44)
AST: 16 U/L (ref 15–41)
Albumin: 3.2 g/dL — ABNORMAL LOW (ref 3.5–5.0)
Alkaline Phosphatase: 88 U/L (ref 38–126)
Anion gap: 10 (ref 5–15)
BUN: 12 mg/dL (ref 8–23)
CO2: 24 mmol/L (ref 22–32)
Calcium: 8.7 mg/dL — ABNORMAL LOW (ref 8.9–10.3)
Chloride: 100 mmol/L (ref 98–111)
Creatinine, Ser: 0.62 mg/dL (ref 0.61–1.24)
GFR, Estimated: >60 mL/min (ref >60)
Glucose, Bld: 90 mg/dL (ref 70–99)
Potassium: 4.1 mmol/L (ref 3.5–5.1)
Sodium: 134 mmol/L — ABNORMAL LOW (ref 135–145)
Total Bilirubin: 0.6 mg/dL (ref 0.0–1.2)
Total Protein: 7.9 g/dL (ref 6.5–8.1)

## 2023-08-15 LAB — URINALYSIS, ROUTINE W REFLEX MICROSCOPIC
Bilirubin Urine: NEGATIVE
Glucose, UA: NEGATIVE mg/dL
Ketones, ur: NEGATIVE mg/dL
Nitrite: POSITIVE — AB
Protein, ur: NEGATIVE mg/dL
Specific Gravity, Urine: 1.017 (ref 1.005–1.030)
WBC, UA: >50 WBC/hpf (ref 0–5)
pH: 5 (ref 5.0–8.0)

## 2023-08-15 MED ORDER — SODIUM CHLORIDE 0.9 % IV BOLUS
1000.0000 mL | Freq: Once | INTRAVENOUS | Status: AC
  Administered 2023-08-15: 1000 mL via INTRAVENOUS

## 2023-08-15 MED ORDER — SODIUM CHLORIDE 0.9 % IV SOLN
1.0000 g | Freq: Once | INTRAVENOUS | Status: AC
Start: 2023-08-15 — End: 2023-08-15
  Administered 2023-08-15: 1 g via INTRAVENOUS
  Filled 2023-08-15: qty 10

## 2023-08-15 MED ORDER — CEFUROXIME AXETIL 500 MG PO TABS: 500.0000 mg | ORAL_TABLET | Freq: Two times a day (BID) | ORAL | 0 refills | Status: AC

## 2023-08-15 NOTE — ED Provider Notes (Signed)
 [place] EMERGENCY DEPARTMENT AT [facility] Provider Note  CSN: [id number] Arrival date & time: 08/15/23 1221  Chief Complaint(s) Urinary Tract Infection  HPI [name] is a 63 y.o. male with past medical history as below, significant for paraplegia, GERD, HCV, neurogenic bladder with self cath, chronic methadone  who presents to the ED with complaint of UTI  Patient reports foul-smelling and discolored urine over the past few days.  No abdominal pain, nausea or vomiting.  No fevers or chills.  No GU rash.  Intermittent constipation unchanged from prior.  No vomiting.  He self catheterizes  Past Medical History Past Medical History:  Diagnosis Date   Anemia    Patient states that he was recently told this   Anxiety    Arthritis    Blood transfusion without reported diagnosis    Depression    GERD (gastroesophageal reflux disease)    Hepatitis C    MRSA infection    Neuromuscular disorder (HCC)    Osteomyelitis (HCC)    Paralysis (HCC)    Paraparesis (HCC)    parapalegic    Pressure ulcer of right buttock    stage 4; pt states that it tunnels up back.   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 Authorizing Provider   clotrimazole -betamethasone  (LOTRISONE ) cream Apply 1 Application topically 2 (two) times daily. 01/19/23   McKenzie, Arden Beck, MD  Docusate Sodium  (DSS) 100 MG CAPS Take 1 capsule by mouth 2 (two) times daily. 03/17/11   [provider]  ibuprofen  (ADVIL ) 600 MG tablet Take 1 tablet (600 mg total) by mouth 3 (three) times daily. 04/13/23   Idol, Julie, PA-C  methadone  (DOLOPHINE ) 10 MG tablet Take 20 mg by mouth 3 (three) times daily.    [provider]  mirabegron  ER (MYRBETRIQ ) 50 MG TB24 tablet Take 1 tablet (50 mg total) by mouth daily. 04/06/23   McKenzie, Arden Beck, MD  naloxone [facility]) nasal spray 4 mg/0.1 mL as directed. 04/10/21   [provider]  pregabalin  (LYRICA ) 75 MG capsule Take 75 mg by mouth 3 (three) times daily.     [provider]  tamsulosin  (FLOMAX ) 0.4 MG CAPS capsule Take 1 capsule (0.4 mg total) by mouth daily. 03/31/23   Lauretta Ponto, FNP  Past Surgical History Past Surgical History:  Procedure Laterality Date   BACK SURGERY     BALLOON DILATION N/A 08/11/2016   Procedure: BALLOON DILATION;  Surgeon: Ruby Corporal, MD;  Location: [facility] ORS;  Service: Endoscopy;  Laterality: N/A;   BOWEL RESECTION N/A 07/02/2021   Procedure: SMALL BOWEL RESECTION;  Surgeon: Marijo Shove, DO;  Location: [facility] ORS;  Service: General;  Laterality: N/A;   CHOLECYSTECTOMY     ERCP N/A 08/11/2016   Procedure: ENDOSCOPIC RETROGRADE CHOLANGIOPANCREATOGRAPHY (ERCP) extended Sphincterotomy;  Surgeon: Ruby Corporal, MD;  Location: [facility] ORS;  Service: Endoscopy;  Laterality: N/A;   FEMUR FRACTURE SURGERY  March 2012   retrograde intramedullary femoral nail   FRACTURE SURGERY     broke back, fusion   GALLBLADDER SURGERY  2010   HERNIA REPAIR     very young   LAPAROTOMY N/A 07/02/2021   Procedure: EXPLORATORY LAPAROTOMY;   Surgeon: Marijo Shove, DO;  Location: [facility] ORS;  Service: General;  Laterality: N/A;   LYSIS OF ADHESION N/A 07/02/2021   Procedure: EXTENSIVE LYSIS OF ADHESIONS;  Surgeon: Marijo Shove, DO;  Location: [facility] ORS;  Service: General;  Laterality: N/A;   ORIF FEMUR FRACTURE Right 08/04/2012   Procedure: OPEN REDUCTION INTERNAL FIXATION (ORIF) DISTAL FEMUR FRACTURE;  Surgeon: Adah Acron, MD;  Location: [facility] OR;  Service: Orthopedics;  Laterality: Right;  Right Retrograde Supracondylar Nail Right Femur   Family History Family History  Problem Relation Age of Onset   Ovarian cancer Mother    HIV Sister    Healthy Sister    Hodgkin's lymphoma Sister    Heart disease Sister    Healthy Sister    Healthy Son    Healthy Daughter    Anemia Daughter    Thyroid disease Daughter    Cancer Other        family history     Social History Social History   Tobacco Use   Smoking status: Former    Current packs/day: 0.00    Average packs/day: 1 pack/day for 5.0 years (5.0 ttl pk-yrs)    Types: Cigarettes    Start date: 08/09/2005    Quit date: 08/10/2010    Years since quitting: 13.0   Smokeless tobacco: Never  Vaping Use   Vaping status: Never Used  Substance Use Topics   Alcohol use: Not Currently    Comment: Patient quit drinking 1 year ago   Drug use: No   Allergies Patient has no known allergies.  Review of Systems A thorough review of systems was obtained and all systems are negative except as noted in the HPI and PMH.   Physical Exam Vital Signs  I have reviewed the triage vital signs BP 129/85 (BP Location: Right Arm)   Pulse 82   Temp 98.7 F (37.1 C) (Oral)   Resp 17   Ht 5\' 9"  (1.753 m)   Wt 68 kg   SpO2 96%   BMI 22.14 kg/m  Physical Exam Vitals and nursing note reviewed.  Constitutional:      General: He is not in acute distress.    Appearance: Normal appearance. He is well-developed. He is not ill-appearing.  HENT:     Head: Normocephalic and  atraumatic.     Right Ear: External ear normal.     Left Ear: External ear normal.     Nose: Nose normal.     Mouth/Throat:     Mouth: Mucous membranes are moist.  Eyes:  General: No scleral icterus.       Right eye: No discharge.        Left eye: No discharge.  Cardiovascular:     Rate and Rhythm: Normal rate.  Pulmonary:     Effort: Pulmonary effort is normal. No respiratory distress.     Breath sounds: No stridor.  Abdominal:     General: Abdomen is flat. There is no distension.     Tenderness: There is no guarding.  Musculoskeletal:        General: No deformity.     Cervical back: No rigidity.  Skin:    General: Skin is warm and dry.     Coloration: Skin is not cyanotic, jaundiced or pale.  Neurological:     Mental Status: He is alert and oriented to person, place, and time.     GCS: GCS eye subscore is 4. GCS verbal subscore is 5. GCS motor subscore is 6.  Psychiatric:        Speech: Speech normal.        Behavior: Behavior normal. Behavior is cooperative.     ED Results and Treatments Labs (all labs ordered are listed, but only abnormal results are displayed) Labs Reviewed  URINALYSIS, ROUTINE W REFLEX MICROSCOPIC - Abnormal; Notable for the following components:      Result Value   APPearance HAZY (*)    Hgb urine dipstick MODERATE (*)    Nitrite POSITIVE (*)    Leukocytes,Ua LARGE (*)    Bacteria, UA RARE (*)    All other components within normal limits  CBC WITH DIFFERENTIAL/PLATELET - Abnormal; Notable for the following components:   Hemoglobin 10.2 (*)    HCT 32.8 (*)    MCV 73.5 (*)    MCH 22.9 (*)    RDW 17.1 (*)    Neutro Abs 8.2 (*)    All other components within normal limits  COMPREHENSIVE METABOLIC PANEL WITH GFR - Abnormal; Notable for the following components:   Sodium 134 (*)    Calcium 8.7 (*)    Albumin 3.2 (*)    All other components within normal limits  URINE CULTURE                                                                                                                           Radiology No results found.  Pertinent labs & imaging results that were available during my care of the patient were reviewed by me and considered in my medical decision making (see MDM for details).  Medications Ordered in ED Medications  cefTRIAXone  (ROCEPHIN ) 1 g in sodium chloride  0.9 % 100 mL IVPB (1 g Intravenous New Bag/Given 08/15/23 1448)  sodium chloride  0.9 % bolus 1,000 mL (1,000 mLs Intravenous New Bag/Given 08/15/23 1447)  Procedures Procedures  (including critical care time)  Medical Decision Making / ED Course    Medical Decision Making:    [name] is a 63 y.o. male with past medical history as below, significant for paraplegia, GERD, HCV, neurogenic bladder with self cath, chronic methadone  who presents to the ED with complaint of UTI. The complaint involves an extensive differential diagnosis and also carries with it a high risk of complications and morbidity.  Serious etiology was considered. Ddx includes but is not limited to: Cystitis, pyelonephritis, nephrolithiasis, epididymitis, etc  Complete initial physical exam performed, notably the patient was in no distress, HDS.    Reviewed and confirmed nursing documentation for past medical history, family history, social history.  Vital signs reviewed.     Brief summary:  63 year old male with history of paraplegia, neurogenic bladder with self cath here with concern for UTI Malodorous urine, abnormal urine color Denies flank pain fever vomiting Abdomen soft, nontender  Clinical Course as of 08/15/23 1452  Mon Aug 15, 2023  1450 Hemoglobin(!): 10.2 Similar to baseline [SG]    Clinical Course User Index [SG] Russella Courts A, DO     Urinalysis concerning for UTI.  Uses self cath, concern for complicated UTI.  Given  Rocephin  here with IV fluids.  Will start antibiotics for home. Urine culture sent  Recommended close follow-up with PCP  Patient in no distress and overall condition improved here in the ED. Detailed discussions were had with the patient/guardian regarding current findings, and need for close f/u with PCP or on call doctor. The patient/guardian has been instructed to return immediately if the symptoms worsen in any way for re-evaluation. Patient/guardian verbalized understanding and is in agreement with current care plan. All questions answered prior to discharge.              Additional history obtained: -Additional history obtained from na -External records from outside source obtained and reviewed including: Chart review including previous notes, labs, imaging, consultation notes including  Prior urine culture Prior labs and imaging   Lab Tests: -I ordered, reviewed, and interpreted labs.   The pertinent results include:   Labs Reviewed  URINALYSIS, ROUTINE W REFLEX MICROSCOPIC - Abnormal; Notable for the following components:      Result Value   APPearance HAZY (*)    Hgb urine dipstick MODERATE (*)    Nitrite POSITIVE (*)    Leukocytes,Ua LARGE (*)    Bacteria, UA RARE (*)    All other components within normal limits  CBC WITH DIFFERENTIAL/PLATELET - Abnormal; Notable for the following components:   Hemoglobin 10.2 (*)    HCT 32.8 (*)    MCV 73.5 (*)    MCH 22.9 (*)    RDW 17.1 (*)    Neutro Abs 8.2 (*)    All other components within normal limits  COMPREHENSIVE METABOLIC PANEL WITH GFR - Abnormal; Notable for the following components:   Sodium 134 (*)    Calcium 8.7 (*)    Albumin 3.2 (*)    All other components within normal limits  URINE CULTURE    Notable for uti  EKG   EKG Interpretation Date/Time:    Ventricular Rate:    PR Interval:    QRS Duration:    QT Interval:    QTC Calculation:   R Axis:      Text Interpretation:            Imaging Studies ordered: na   Medicines ordered and prescription drug  management: Meds ordered this encounter  Medications   sodium chloride  0.9 % bolus 1,000 mL   cefTRIAXone  (ROCEPHIN ) 1 g in sodium chloride  0.9 % 100 mL IVPB    Antibiotic Indication::   UTI    -I have reviewed the patients home medicines and have made adjustments as needed   Consultations Obtained: na   Cardiac Monitoring: Continuous pulse oximetry interpreted by myself, 98% on RA.    Social Determinants of Health:  Diagnosis or treatment significantly limited by social determinants of health: former smoker former etoh   Reevaluation: After the interventions noted above, I reevaluated the patient and found that they have improved  Co morbidities that complicate the patient evaluation  Past Medical History:  Diagnosis Date   Anemia    Patient states that he was recently told this   Anxiety    Arthritis    Blood transfusion without reported diagnosis    Depression    GERD (gastroesophageal reflux disease)    Hepatitis C    MRSA infection    Neuromuscular disorder (HCC)    Osteomyelitis (HCC)    Paralysis (HCC)    Paraparesis (HCC)    parapalegic    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   Substance abuse (HCC)    drinking,       Dispostion: Disposition decision including need for hospitalization was considered, and patient discharged from emergency department.    Final Clinical Impression(s) / ED Diagnoses Final diagnoses:  Acute cystitis without hematuria        Teddi Favors, DO 08/15/23 1452

## 2023-08-15 NOTE — ED Triage Notes (Signed)
 Pt arrived via POV c/o strong, foul smelling urine and episodes of dizziness. Pt concerned for UTI.

## 2023-08-15 NOTE — Discharge Instructions (Addendum)
 It was a pleasure caring for you today in the emergency department.  Please return to the emergency department for any worsening or worrisome symptoms, including but not limited to: flank pain, nausea/vomiting, fever, etc.       RESOURCE GUIDE  Chronic Pain Problems: Contact [facility]  [phone] Patients need to be referred by their primary care doctor.  Insufficient Money for Medicine: Contact [employer]:  call [phone]  No Primary Care Doctor: Call [facility]  [phone] - can help you locate a primary care doctor that  accepts your insurance, provides certain services, etc. Physician Referral Service- [phone]  Agencies that provide inexpensive medical care: [facility]  [phone] [facility]  [phone] [facility]  [phone] [facility]  [phone] [facility]  [phone] [facility]  [phone]  Medicaid-accepting [facility] Providers: [facility]- [address]  [phone], Mon-Fri 9am-7pm, Sat 9am-1pm [facility]- [address] [place], [address] [place]  [phone] [facility]- [address] [place]  [phone] [facility]- [address]  [phone] Jonathon Neighbors- [address] [place], [address], [phone]  Only accepts [place] [facility] [place] patients after they have their name  applied to their card  Self Pay (no insurance) in [facility]: Sickle Cell Patients - [facility]  [address] [place], [phone] [facility]- [address] [place]  [phone]       [facility]- [address]       -     [facility]- see information above (Speak to [employer] if you do not have insurance)       -  [facility]- [address],  [phone]       -  [facility]- [address] [phone]       -   Dr Sherlene Diss-  [address], [place], [phone]       -  [facility] - [address], [phone]       -  [facility]- [address], [phone], also [address] [phone]       -     [facility]- [address] [place], [phone], 1st & 3rd Saturday         every month, 10am-1pm  -     [facility]   [address] [place], [place].   Phone:  [phone], Fax:  [phone]. Hours of Operation:  9 am - 6 pm, M-F.  -     [facility]   [address], [place]   Phone: [phone], Fax: [phone]. Hours of Operation:  8:30 am - 5:30 pm, M-F.    Dental Assistance If unable to pay or uninsured, contact:  [facility]. to become qualified for the adult dental clinic.  Patients with Medicaid: [facility] [phone] [address], [phone] [address], [phone]  If unable to pay, or uninsured, contact [facility] [phone] in [place], [phone] in [facility]) to become qualified for the adult dental clinic  [facility] [address] [place], [place] [address] [phone] [email or web address]  Other IT consultant: [facility]- [address] [place], [place], [address], [phone], Ext. [phone], 2nd and 4th Thursday of the month at 6:30am.  10 clients each day by appointment, can sometimes see walk-in patients if someone does not show for an appointment. [facility]- [address] [place], [place], [address], [phone] [facility] [address] [place], [place], [address], [phone] [facility]- [phone] [facility] Health Department- [phone] [facility] [phone]

## 2023-08-17 LAB — URINE CULTURE: Culture: >=100000 — AB

## 2023-08-18 ENCOUNTER — Telehealth (HOSPITAL_BASED_OUTPATIENT_CLINIC_OR_DEPARTMENT_OTHER): Payer: Self-pay | Admitting: *Deleted

## 2023-08-18 ENCOUNTER — Ambulatory Visit: Payer: Self-pay

## 2023-08-18 NOTE — Telephone Encounter (Signed)
  Copied from CRM [id number]. Topic: Clinical - Red Word Triage >> Aug 18, 2023  4:35 PM Turkey B wrote: [facility] that prompted transfer to Nurse Triage:pt has spasms in his stomach, and he is afraid he is gonna have sob, happening because it happened to him in the past Reason for Disposition  Abdominal pain is a chronic symptom (recurrent or ongoing AND present > 4 weeks)  Answer Assessment - Initial Assessment Questions 1. LOCATION: Where does it hurt?      Generalized abdominal cramping 2. RADIATION: Does the pain shoot anywhere else? (e.g., chest, back)     No radiation 3. ONSET: When did the pain begin? (Minutes, hours or days ago)      Patient not able to give good information 4. SUDDEN: Gradual or sudden onset?     gradual 5. PATTERN Does the pain come and go, or is it constant?    - If it comes and goes: How long does it last? Do you have pain now?     (Note: Comes and goes means the pain is intermittent. It goes away completely between bouts.)    - If constant: Is it getting better, staying the same, or getting worse?      (Note: Constant means the pain never goes away completely; most serious pain is constant and gets worse.)      constant 6. SEVERITY: How bad is the pain?  (e.g., Scale 1-10; mild, moderate, or severe)    - MILD (1-3): Doesn't interfere with normal activities, abdomen soft and not tender to touch.     - MODERATE (4-7): Interferes with normal activities or awakens from sleep, abdomen tender to touch.     - SEVERE (8-10): Excruciating pain, doubled over, unable to do any normal activities.       Moderate  Patient had to be called back after he hung up on the agent before being transferred. Patient calling with the main objective to speak with a certain surgeon that this triage line doesn't cover. Patient gave some information but hung up on Nurse Triage.  Protocols used: Abdominal Pain - Male-A-AH

## 2023-08-18 NOTE — Telephone Encounter (Signed)
 Post ED Visit - Positive Culture Follow-up  Culture report reviewed by antimicrobial stewardship pharmacist: [facility] [x]  [place], [place].D. []  Skeet Duke, Pharm.D., BCPS AQ-ID []  Leslee Rase, Pharm.D., BCPS []  Garland Junk, Pharm.D., BCPS []  [place], [place].D., BCPS, AAHIVP []  Alcide Aly, Pharm.D., BCPS, AAHIVP []  Jerri Morale, PharmD, BCPS []  Graham Laws, PharmD, BCPS []  Cleda Curly, PharmD, BCPS []  Tamar Fairly, PharmD []  Ballard Levels, PharmD, BCPS []  Ollen Beverage, PharmD  [facility] []  Arlyne Bering, PharmD []  Sherryle Don, PharmD []  Van Gelinas, PharmD []  Delila Felty, Rph []  [place]) Cleora Daft, PharmD []  Augustina Block, PharmD []  Arie Kurtz, PharmD []  Sharlyn Deaner, PharmD []  Agnes Hose, PharmD []  Kendall Pauls, PharmD []  Gladstone Lamer, PharmD []  Armanda Bern, PharmD []  Tera Fellows, PharmD   Positive urine culture, collected 08/15/23. Treated with cefuroxime, organism sensitive to the same and no further patient follow-up is required at this time.  Zeb Heys 08/18/2023, 10:29 AM

## 2023-12-03 ENCOUNTER — Other Ambulatory Visit: Payer: Self-pay

## 2023-12-03 ENCOUNTER — Emergency Department (HOSPITAL_COMMUNITY)
Admission: EM | Admit: 2023-12-03 | Discharge: 2023-12-03 | Disposition: A | Discharge status: Home / Self Care | Attending: Emergency Medicine | Admitting: Emergency Medicine

## 2023-12-03 ENCOUNTER — Encounter (HOSPITAL_COMMUNITY): Payer: Self-pay

## 2023-12-03 DIAGNOSIS — R509 Fever, unspecified: Secondary | ICD-10-CM | POA: Diagnosis present

## 2023-12-03 DIAGNOSIS — Z5189 Encounter for other specified aftercare: Secondary | ICD-10-CM

## 2023-12-03 DIAGNOSIS — Z87891 Personal history of nicotine dependence: Secondary | ICD-10-CM | POA: Diagnosis not present

## 2023-12-03 DIAGNOSIS — N3000 Acute cystitis without hematuria: Secondary | ICD-10-CM | POA: Insufficient documentation

## 2023-12-03 DIAGNOSIS — Z4800 Encounter for change or removal of nonsurgical wound dressing: Secondary | ICD-10-CM | POA: Diagnosis not present

## 2023-12-03 LAB — URINALYSIS, W/ REFLEX TO CULTURE (INFECTION SUSPECTED)
Bilirubin Urine: NEGATIVE
Glucose, UA: NEGATIVE mg/dL
Ketones, ur: 5 mg/dL — AB
Nitrite: POSITIVE — AB
Protein, ur: 30 mg/dL — AB
Specific Gravity, Urine: 1.026 (ref 1.005–1.030)
pH: 5 (ref 5.0–8.0)

## 2023-12-03 LAB — CBC WITH DIFFERENTIAL/PLATELET
Abs Immature Granulocytes: 0.01 K/uL (ref 0.00–0.07)
Basophils Absolute: 0 K/uL (ref 0.0–0.1)
Basophils Relative: 0 %
Eosinophils Absolute: 0 K/uL (ref 0.0–0.5)
Eosinophils Relative: 1 %
HCT: 31.5 % — ABNORMAL LOW (ref 39.0–52.0)
Hemoglobin: 9.4 g/dL — ABNORMAL LOW (ref 13.0–17.0)
Immature Granulocytes: 0 %
Lymphocytes Relative: 11 %
Lymphs Abs: 0.7 K/uL (ref 0.7–4.0)
MCH: 22.6 pg — ABNORMAL LOW (ref 26.0–34.0)
MCHC: 29.8 g/dL — ABNORMAL LOW (ref 30.0–36.0)
MCV: 75.7 fL — ABNORMAL LOW (ref 80.0–100.0)
Monocytes Absolute: 0.5 K/uL (ref 0.1–1.0)
Monocytes Relative: 8 %
Neutro Abs: 5.1 K/uL (ref 1.7–7.7)
Neutrophils Relative %: 80 %
Platelets: 260 K/uL (ref 150–400)
RBC: 4.16 MIL/uL — ABNORMAL LOW (ref 4.22–5.81)
RDW: 17 % — ABNORMAL HIGH (ref 11.5–15.5)
WBC: 6.4 K/uL (ref 4.0–10.5)
nRBC: 0 % (ref 0.0–0.2)

## 2023-12-03 LAB — BASIC METABOLIC PANEL WITH GFR
Anion gap: 13 (ref 5–15)
BUN: 13 mg/dL (ref 8–23)
CO2: 24 mmol/L (ref 22–32)
Calcium: 8.3 mg/dL — ABNORMAL LOW (ref 8.9–10.3)
Chloride: 97 mmol/L — ABNORMAL LOW (ref 98–111)
Creatinine, Ser: 0.57 mg/dL — ABNORMAL LOW (ref 0.61–1.24)
GFR, Estimated: >60 mL/min (ref >60)
Glucose, Bld: 83 mg/dL (ref 70–99)
Potassium: 3.2 mmol/L — ABNORMAL LOW (ref 3.5–5.1)
Sodium: 134 mmol/L — ABNORMAL LOW (ref 135–145)

## 2023-12-03 LAB — LACTIC ACID, PLASMA: Lactic Acid, Venous: 1.1 mmol/L (ref 0.5–1.9)

## 2023-12-03 MED ORDER — AMOXICILLIN-POT CLAVULANATE 875-125 MG PO TABS: 1.0000 | ORAL_TABLET | Freq: Two times a day (BID) | ORAL | 0 refills | Status: DC

## 2023-12-03 MED ORDER — LIDOCAINE HCL (PF) 1 % IJ SOLN
2.0000 mL | Freq: Once | INTRAMUSCULAR | Status: AC
Start: 2023-12-03 — End: 2023-12-03
  Administered 2023-12-03: 2 mL
  Filled 2023-12-03: qty 5

## 2023-12-03 MED ORDER — CEFTRIAXONE SODIUM 1 G IJ SOLR
1.0000 g | Freq: Once | INTRAMUSCULAR | Status: AC
  Administered 2023-12-03: 1 g via INTRAMUSCULAR
  Filled 2023-12-03: qty 10

## 2023-12-03 MED ORDER — POTASSIUM CHLORIDE CRYS ER 20 MEQ PO TBCR
40.0000 meq | EXTENDED_RELEASE_TABLET | Freq: Once | ORAL | Status: AC
  Administered 2023-12-03: 40 meq via ORAL
  Filled 2023-12-03: qty 2

## 2023-12-03 MED ORDER — SODIUM CHLORIDE 0.9 % IV BOLUS: 1000.0000 mL | Freq: Once | INTRAVENOUS | Status: DC

## 2023-12-03 NOTE — ED Provider Notes (Signed)
 [facility] EMERGENCY DEPARTMENT AT [facility] Provider Note  CSN: [id number] Arrival date & time: 12/03/23 1636  Chief Complaint(s) Wound Check  HPI [name] is a 63 y.o. male history of spinal cord injury, chronic osteomyelitis, chronic stage IV pressure ulcer presenting to the emergency department with pain.  Patient reports increased pain around pressure ulcer.  Has not noticed any increased drainage.  Did have fever at home.  Reports that when he has increasing pain he typically is about to get an infection.  Request antibiotics.  He also reports that he has had some foul-smelling urine recently.  He self catheterizes.  No chest pain, abdominal pain, diarrhea.  No vomiting.   Past Medical History Past Medical History:  Diagnosis Date   Anemia    Patient states that he was recently told this   Anxiety    Arthritis    Blood transfusion without reported diagnosis    Depression    GERD (gastroesophageal reflux disease)    Hepatitis C    MRSA infection    Neuromuscular disorder (HCC)    Osteomyelitis (HCC)    Paralysis (HCC)    Paraparesis (HCC)    parapalegic    Pressure ulcer of right buttock    stage 4; pt states that it tunnels up back.   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 Authorizing Provider  amoxicillin -clavulanate (AUGMENTIN ) 875-125 MG tablet Take 1 tablet by mouth every 12 (twelve) hours. 12/03/23  Yes Francesca Elsie CROME, MD  clotrimazole -betamethasone  (LOTRISONE ) cream Apply 1 Application topically 2 (two) times daily. 01/19/23   McKenzie, Belvie CROME, MD  Docusate Sodium  (DSS) 100 MG CAPS Take 1 capsule by mouth 2 (two) times daily. 03/17/11   [provider]  ibuprofen  (ADVIL ) 600 MG tablet Take 1 tablet (600 mg total) by mouth 3 (three) times daily. 04/13/23   Idol, Julie, PA-C  methadone  (DOLOPHINE ) 10 MG tablet Take 20 mg by mouth 3 (three) times daily.    [provider]  mirabegron  ER (MYRBETRIQ ) 50 MG TB24 tablet Take 1 tablet (50 mg total) by mouth daily. 04/06/23   McKenzie, Belvie CROME, MD  naloxone [facility]) nasal spray 4 mg/0.1 mL as directed. 04/10/21   [provider]  pregabalin  (LYRICA ) 75 MG capsule Take 75 mg by mouth 3 (three) times daily.     [provider]  tamsulosin  (FLOMAX ) 0.4 MG CAPS capsule Take 1 capsule (0.4 mg total) by mouth daily. 03/31/23   Gerldine Lauraine BROCKS, FNP  Past Surgical History Past Surgical History:  Procedure Laterality Date   BACK SURGERY     BALLOON DILATION N/A 08/11/2016   Procedure: BALLOON DILATION;  Surgeon: Golda Claudis PENNER, MD;  Location: [facility];  Service: Endoscopy;  Laterality: N/A;   BOWEL RESECTION N/A 07/02/2021   Procedure: SMALL BOWEL RESECTION;  Surgeon: Evonnie Dorothyann LABOR, DO;  Location: [facility];  Service: General;  Laterality: N/A;   CHOLECYSTECTOMY     ERCP N/A 08/11/2016   Procedure: ENDOSCOPIC RETROGRADE CHOLANGIOPANCREATOGRAPHY (ERCP) extended Sphincterotomy;  Surgeon: Golda Claudis PENNER, MD;  Location: [facility];  Service: Endoscopy;  Laterality: N/A;   FEMUR FRACTURE SURGERY   March 2012   retrograde intramedullary femoral nail   FRACTURE SURGERY     broke back, fusion   GALLBLADDER SURGERY  2010   HERNIA REPAIR     very young   LAPAROTOMY N/A 07/02/2021   Procedure: EXPLORATORY LAPAROTOMY;  Surgeon: Evonnie Dorothyann LABOR, DO;  Location: [facility];  Service: General;  Laterality: N/A;   LYSIS OF ADHESION N/A 07/02/2021   Procedure: EXTENSIVE LYSIS OF ADHESIONS;  Surgeon: Evonnie Dorothyann LABOR, DO;  Location: [facility];  Service: General;  Laterality: N/A;   ORIF FEMUR FRACTURE Right 08/04/2012   Procedure: OPEN REDUCTION INTERNAL FIXATION (ORIF) DISTAL FEMUR FRACTURE;  Surgeon: Oneil JAYSON Herald, MD;  Location: [facility];  Service: Orthopedics;  Laterality: Right;  Right Retrograde Supracondylar Nail Right Femur   Family History Family History  Problem Relation Age of Onset   Ovarian cancer Mother    HIV Sister    Healthy Sister    Hodgkin's lymphoma Sister    Heart disease Sister    Healthy Sister    Healthy Son    Healthy Daughter    Anemia Daughter    Thyroid disease Daughter    Cancer Other        family history     Social History Social History   Tobacco Use   Smoking status: Former    Current packs/day: 0.00    Average packs/day: 1 pack/day for 5.0 years (5.0 ttl pk-yrs)    Types: Cigarettes    Start date: 08/09/2005    Quit date: 08/10/2010    Years since quitting: 13.3   Smokeless tobacco: Never  Vaping Use   Vaping status: Never Used  Substance Use Topics   Alcohol use: Not Currently    Comment: Patient quit drinking 1 year ago   Drug use: No   Allergies Patient has no known allergies.  Review of Systems Review of Systems  All other systems reviewed and are negative.   Physical Exam Vital Signs  I have reviewed the triage vital signs BP (!) 147/85   Pulse 91   Temp 99.7 F (37.6 C) (Oral)   Resp 20   Wt 59 kg   SpO2 97%   BMI 19.20 kg/m  Physical Exam Vitals and nursing note reviewed.  Constitutional:      General: He is not  in acute distress.    Appearance: Normal appearance.  HENT:     Mouth/Throat:     Mouth: Mucous membranes are moist.  Eyes:     Conjunctiva/sclera: Conjunctivae normal.  Cardiovascular:     Rate and Rhythm: Normal rate and regular rhythm.  Pulmonary:     Effort: Pulmonary effort is normal. No respiratory distress.     Breath sounds: Normal breath sounds.  Abdominal:     General: Abdomen is flat.     Palpations: Abdomen  is soft.     Tenderness: There is no abdominal tenderness.  Genitourinary:    Comments: Stage IV pressure ulcer to the right perianal/gluteal area, see chart for photo.  Appears unchanged from prior.  No active drainage or foul smell appreciable Musculoskeletal:     Right lower leg: No edema.     Left lower leg: No edema.  Skin:    General: Skin is warm and dry.     Capillary Refill: Capillary refill takes less than 2 seconds.  Neurological:     Mental Status: He is alert and oriented to person, place, and time. Mental status is at baseline.  Psychiatric:        Mood and Affect: Mood normal.        Behavior: Behavior normal.     ED Results and Treatments Labs (all labs ordered are listed, but only abnormal results are displayed) Labs Reviewed  CBC WITH DIFFERENTIAL/PLATELET - Abnormal; Notable for the following components:      Result Value   RBC 4.16 (*)    Hemoglobin 9.4 (*)    HCT 31.5 (*)    MCV 75.7 (*)    MCH 22.6 (*)    MCHC 29.8 (*)    RDW 17.0 (*)    All other components within normal limits  BASIC METABOLIC PANEL WITH GFR - Abnormal; Notable for the following components:   Sodium 134 (*)    Potassium 3.2 (*)    Chloride 97 (*)    Creatinine, Ser 0.57 (*)    Calcium 8.3 (*)    All other components within normal limits  URINALYSIS, W/ REFLEX TO CULTURE (INFECTION SUSPECTED) - Abnormal; Notable for the following components:   APPearance HAZY (*)    Hgb urine dipstick MODERATE (*)    Ketones, ur 5 (*)    Protein, ur 30 (*)    Nitrite  POSITIVE (*)    Leukocytes,Ua MODERATE (*)    Bacteria, UA MANY (*)    All other components within normal limits  URINE CULTURE  LACTIC ACID, PLASMA                                                                                                                          Radiology No results found.  Pertinent labs & imaging results that were available during my care of the patient were reviewed by me and considered in my medical decision making (see MDM for details).  Medications Ordered in ED Medications  cefTRIAXone  (ROCEPHIN ) injection 1 g (has no administration in time range)  lidocaine  (PF) (XYLOCAINE ) 1 % injection 2 mL (has no administration in time range)  potassium chloride  SA (KLOR-CON  M) CR tablet 40 mEq (has no administration in time range)  Procedures Procedures  (including critical care time)  Medical Decision Making / ED Course   MDM:  63 year old presented to the emergency department with pain around pressure wound.  Patient is overall well-appearing, physical examination with chronic appearing wound without active erythema, drainage, foul smell.  Reviewed prior images, does not appear significantly different.  However, patient does report increased pain in the area and reports that this has been a sign of early infection previously and he does not have fever at home.  I do not think the patient needs further imaging or admission but can cover with antibiotics and will follow-up with his primary doctor for close follow-up.  He also reports some urinary symptoms.  Will check urinalysis.  Clinical Course as of 12/03/23 1910  Sat Dec 03, 2023  1908 UA does show signs of a UTI.  Will prescribe Augmentin .  Will give dose of ceftriaxone  in the emergency department to cover for UTI.  Discussed findings with the patient.  Feels comfortable to  plan.  Recommended PMD follow-up.  Discussed return precautions [WS]    Clinical Course User Index [WS] Francesca Elsie CROME, MD     Additional history obtained:  -External records from outside source obtained and reviewed including: Chart review including previous notes, labs, imaging, consultation notes including prior notes    Lab Tests: -I ordered, reviewed, and interpreted labs.   The pertinent results include:   Labs Reviewed  CBC WITH DIFFERENTIAL/PLATELET - Abnormal; Notable for the following components:      Result Value   RBC 4.16 (*)    Hemoglobin 9.4 (*)    HCT 31.5 (*)    MCV 75.7 (*)    MCH 22.6 (*)    MCHC 29.8 (*)    RDW 17.0 (*)    All other components within normal limits  BASIC METABOLIC PANEL WITH GFR - Abnormal; Notable for the following components:   Sodium 134 (*)    Potassium 3.2 (*)    Chloride 97 (*)    Creatinine, Ser 0.57 (*)    Calcium 8.3 (*)    All other components within normal limits  URINALYSIS, W/ REFLEX TO CULTURE (INFECTION SUSPECTED) - Abnormal; Notable for the following components:   APPearance HAZY (*)    Hgb urine dipstick MODERATE (*)    Ketones, ur 5 (*)    Protein, ur 30 (*)    Nitrite POSITIVE (*)    Leukocytes,Ua MODERATE (*)    Bacteria, UA MANY (*)    All other components within normal limits  URINE CULTURE  LACTIC ACID, PLASMA    Notable for UTI, no leukocytosis  Medicines ordered and prescription drug management: Meds ordered this encounter  Medications   DISCONTD: sodium chloride  0.9 % bolus 1,000 mL   cefTRIAXone  (ROCEPHIN ) injection 1 g    Antibiotic Indication::   UTI   lidocaine  (PF) (XYLOCAINE ) 1 % injection 2 mL   amoxicillin -clavulanate (AUGMENTIN ) 875-125 MG tablet    Sig: Take 1 tablet by mouth every 12 (twelve) hours.    Dispense:  14 tablet    Refill:  0   potassium chloride  SA (KLOR-CON  M) CR tablet 40 mEq    -I have reviewed the patients home medicines and have made adjustments as  needed  Social Determinants of Health:  Diagnosis or treatment significantly limited by social determinants of health: former smoker   Reevaluation: After the interventions noted above, I reevaluated the patient and found that their symptoms have improved  Co morbidities that  complicate the patient evaluation  Past Medical History:  Diagnosis Date   Anemia    Patient states that he was recently told this   Anxiety    Arthritis    Blood transfusion without reported diagnosis    Depression    GERD (gastroesophageal reflux disease)    Hepatitis C    MRSA infection    Neuromuscular disorder (HCC)    Osteomyelitis (HCC)    Paralysis (HCC)    Paraparesis (HCC)    parapalegic    Pressure ulcer of right buttock    stage 4; pt states that it tunnels up back.   Substance abuse (HCC)    drinking,       Dispostion: Disposition decision including need for hospitalization was considered, and patient discharged from emergency department.    Final Clinical Impression(s) / ED Diagnoses Final diagnoses:  Visit for wound check  Acute cystitis without hematuria     This chart was dictated using voice recognition software.  Despite best efforts to proofread,  errors can occur which can change the documentation meaning.    Francesca Elsie CROME, MD 12/03/23 1910

## 2023-12-03 NOTE — Discharge Instructions (Signed)
 We evaluated you for your urinary symptoms and wound pain.  We suspect that you have a urine infection based on your urinary testing.  You may also have an early wound infection.  We have given you an antibiotic in the emergency department and we have sent a prescription that you can take at home to treat these conditions.  Please return to the emergency department if you have any new or worsening symptoms such as persistent fevers, increasing pain, nausea or vomiting, or any other new or worsening symptoms

## 2023-12-03 NOTE — ED Notes (Signed)
 Pt yelling very loudly over and over. Pt can be heard through the entire department. RN approached and asked PT to please lower his voice. Pt was very rude using even a louder voice and offensive foul language stating he's from [place] and people from [place] are loud. RN reminded him that this is [place] and a hospital where other people also do not feel good. RN again asked if pt could lower his voice and he stated, Get the hell out of here. RN and tech closed the door to minimize noise. PT is still yelling at someone on the phone. CN aware. Male nurse agreed to start line and fluids,

## 2023-12-03 NOTE — ED Notes (Signed)
 Patient transported to MRI

## 2023-12-03 NOTE — ED Triage Notes (Signed)
 Pt stated that he has stage 4 pressure ulcer on right butt cheek and stated that he thinks it is infected. Pt also thinks he has UTI

## 2023-12-04 ENCOUNTER — Telehealth (HOSPITAL_COMMUNITY): Payer: Self-pay | Admitting: Emergency Medicine

## 2023-12-04 MED ORDER — AMOXICILLIN-POT CLAVULANATE 875-125 MG PO TABS: 1.0000 | ORAL_TABLET | Freq: Two times a day (BID) | ORAL | 0 refills | Status: DC

## 2023-12-04 NOTE — Telephone Encounter (Cosign Needed)
 Patient seen yesterday.  Prescriptions originally sent to pharmacy of record, [place], patient called today requesting medication be switched to [facility]  Medication was sent to pharmacy of pt's request  Description for Augmentin 

## 2023-12-06 LAB — URINE CULTURE: Culture: >=100000 — AB

## 2023-12-07 ENCOUNTER — Telehealth (HOSPITAL_BASED_OUTPATIENT_CLINIC_OR_DEPARTMENT_OTHER): Payer: Self-pay | Admitting: *Deleted

## 2023-12-07 NOTE — Telephone Encounter (Signed)
 Post ED Visit - Positive Culture Follow-up  Culture report reviewed by antimicrobial stewardship pharmacist: [facility] []  [address], Pharm.D. [x]  Venetia Gully, Pharm.D., BCPS AQ-ID []  Garrel Crews, Pharm.D., BCPS []  Almarie Lunger, [address].D., BCPS []  [place], [place].D., BCPS, AAHIVP []  Rosaline Bihari, Pharm.D., BCPS, AAHIVP []  Vernell Meier, PharmD, BCPS []  Latanya Hint, PharmD, BCPS []  Donald Medley, PharmD, BCPS []  Rocky Bold, PharmD []  Dorothyann Alert, PharmD, BCPS []  Morene Babe, PharmD  [facility] []  Rosaline Edison, PharmD []  Romona Bliss, PharmD []  Dolphus Roller, PharmD []  Veva Seip, Rph []  Vernell Daunt) Leonce, PharmD []  Eva Allis, PharmD []  Rosaline Millet, PharmD []  Iantha Batch, PharmD []  Arvin Gauss, PharmD []  Wanda Hasting, PharmD []  Ronal Rav, PharmD []  Rocky Slade, PharmD []  Bard Jeans, PharmD   Positive urine culture Treated with amoxicillin , organism sensitive to the same and no further patient follow-up is required at this time.  Lorita Barnie Pereyra 12/07/2023, 12:30 PM

## 2024-02-29 ENCOUNTER — Emergency Department (HOSPITAL_COMMUNITY)

## 2024-02-29 ENCOUNTER — Emergency Department (HOSPITAL_COMMUNITY)
Admission: EM | Admit: 2024-02-29 | Discharge: 2024-02-29 | Disposition: A | Discharge status: Home / Self Care | Attending: Emergency Medicine | Admitting: Emergency Medicine

## 2024-02-29 ENCOUNTER — Encounter (HOSPITAL_COMMUNITY): Payer: Self-pay

## 2024-02-29 ENCOUNTER — Other Ambulatory Visit: Payer: Self-pay

## 2024-02-29 DIAGNOSIS — R82998 Other abnormal findings in urine: Secondary | ICD-10-CM | POA: Diagnosis present

## 2024-02-29 DIAGNOSIS — D72829 Elevated white blood cell count, unspecified: Secondary | ICD-10-CM | POA: Insufficient documentation

## 2024-02-29 DIAGNOSIS — L89314 Pressure ulcer of right buttock, stage 4: Secondary | ICD-10-CM | POA: Insufficient documentation

## 2024-02-29 DIAGNOSIS — R300 Dysuria: Secondary | ICD-10-CM | POA: Insufficient documentation

## 2024-02-29 DIAGNOSIS — R109 Unspecified abdominal pain: Secondary | ICD-10-CM | POA: Diagnosis not present

## 2024-02-29 LAB — COMPREHENSIVE METABOLIC PANEL WITH GFR
ALT: 148 U/L — ABNORMAL HIGH (ref 0–44)
AST: 185 U/L — ABNORMAL HIGH (ref 15–41)
Albumin: 4.2 g/dL (ref 3.5–5.0)
Alkaline Phosphatase: 160 U/L — ABNORMAL HIGH (ref 38–126)
Anion gap: 15 (ref 5–15)
BUN: 9 mg/dL (ref 8–23)
CO2: 24 mmol/L (ref 22–32)
Calcium: 9.2 mg/dL (ref 8.9–10.3)
Chloride: 98 mmol/L (ref 98–111)
Creatinine, Ser: 0.56 mg/dL — ABNORMAL LOW (ref 0.61–1.24)
GFR, Estimated: >60 mL/min
Glucose, Bld: 86 mg/dL (ref 70–99)
Potassium: 3.9 mmol/L (ref 3.5–5.1)
Sodium: 137 mmol/L (ref 135–145)
Total Bilirubin: 3.4 mg/dL — ABNORMAL HIGH (ref 0.0–1.2)
Total Protein: 7.8 g/dL (ref 6.5–8.1)

## 2024-02-29 LAB — URINALYSIS, ROUTINE W REFLEX MICROSCOPIC
Bilirubin Urine: NEGATIVE
Glucose, UA: NEGATIVE mg/dL
Ketones, ur: NEGATIVE mg/dL
Nitrite: NEGATIVE
Protein, ur: NEGATIVE mg/dL
Specific Gravity, Urine: 1.01 (ref 1.005–1.030)
pH: 7 (ref 5.0–8.0)

## 2024-02-29 LAB — CBC
HCT: 32 % — ABNORMAL LOW (ref 39.0–52.0)
Hemoglobin: 9.9 g/dL — ABNORMAL LOW (ref 13.0–17.0)
MCH: 22.1 pg — ABNORMAL LOW (ref 26.0–34.0)
MCHC: 30.9 g/dL (ref 30.0–36.0)
MCV: 71.6 fL — ABNORMAL LOW (ref 80.0–100.0)
Platelets: 337 K/uL (ref 150–400)
RBC: 4.47 MIL/uL (ref 4.22–5.81)
RDW: 17.6 % — ABNORMAL HIGH (ref 11.5–15.5)
WBC: 15.3 K/uL — ABNORMAL HIGH (ref 4.0–10.5)
nRBC: 0 % (ref 0.0–0.2)

## 2024-02-29 LAB — LIPASE, BLOOD: Lipase: <10 U/L — ABNORMAL LOW (ref 11–51)

## 2024-02-29 LAB — LACTIC ACID, PLASMA: Lactic Acid, Venous: 1.2 mmol/L (ref 0.5–1.9)

## 2024-02-29 MED ORDER — IOHEXOL 300 MG/ML  SOLN
100.0000 mL | Freq: Once | INTRAMUSCULAR | Status: AC | PRN
  Administered 2024-02-29: 100 mL via INTRAVENOUS

## 2024-02-29 MED ORDER — AMOXICILLIN-POT CLAVULANATE 875-125 MG PO TABS: 1.0000 | ORAL_TABLET | Freq: Two times a day (BID) | ORAL | 0 refills | Status: AC

## 2024-02-29 MED ORDER — SODIUM CHLORIDE 0.9 % IV SOLN
1.0000 g | Freq: Once | INTRAVENOUS | Status: AC
  Administered 2024-02-29: 1 g via INTRAVENOUS
  Filled 2024-02-29: qty 10

## 2024-02-29 NOTE — ED Triage Notes (Addendum)
 Pt presents with fever, fatigue, and abd pain that started last night after eating a Christmas dinner. He also reports having cloudy and smelly urine for about a week. Pt self caths.

## 2024-02-29 NOTE — Discharge Instructions (Addendum)
 A prescription for Augmentin  was sent to pharmacy.  Please pick this up and take as prescribed.  If experience any worsening symptoms please return to the emergency room.  Will provide a referral for wound care as well.

## 2024-02-29 NOTE — ED Provider Notes (Signed)
 " [place] [facility] AT [facility] Provider Note   CSN: [id number] Arrival date & time: 02/29/24  1618     Patient presents with: No chief complaint on file.   [name] is a 63 y.o. male with history of paraplegia, neurogenic bladder with self cath, chronic pressure ulcer right buttock presents with complaints of foul-smelling urine.  States that he gets frequent UTIs and feels like he has one.  Last night he had significant abdominal pain with vomiting, no diarrhea.  Denies any abdominal pain today.  He did eat his dinner rapidly last night and he is unsure if his abdominal pain was from or from a UTI.  Reports that he has had feces in his ulcer and has had worsening pain in the ulcer.  Surgical history includes cholecystectomy and bowel resection and ascending SBO.   HPI    Past Medical History:  Diagnosis Date   Anemia    Patient states that he was recently told this   Anxiety    Arthritis    Blood transfusion without reported diagnosis    Depression    GERD (gastroesophageal reflux disease)    Hepatitis C    MRSA infection    Neuromuscular disorder (HCC)    Osteomyelitis (HCC)    Paralysis (HCC)    Paraparesis (HCC)    parapalegic    Pressure ulcer of right buttock    stage 4; pt states that it tunnels up back.   Substance abuse (HCC)    drinking,    Past Surgical History:  Procedure Laterality Date   BACK SURGERY     BALLOON DILATION N/A 08/11/2016   Procedure: BALLOON DILATION;  Surgeon: Golda Claudis PENNER, MD;  Location: [facility];  Service: Endoscopy;  Laterality: N/A;   BOWEL RESECTION N/A 07/02/2021   Procedure: SMALL BOWEL RESECTION;  Surgeon: Evonnie Dorothyann LABOR, DO;  Location: [facility];  Service: General;  Laterality: N/A;   CHOLECYSTECTOMY     ERCP N/A 08/11/2016   Procedure: ENDOSCOPIC RETROGRADE CHOLANGIOPANCREATOGRAPHY (ERCP) extended Sphincterotomy;  Surgeon: Golda Claudis PENNER, MD;  Location: [facility];  Service: Endoscopy;  Laterality:  N/A;   FEMUR FRACTURE SURGERY  March 2012   retrograde intramedullary femoral nail   FRACTURE SURGERY     broke back, fusion   GALLBLADDER SURGERY  2010   HERNIA REPAIR     very young   LAPAROTOMY N/A 07/02/2021   Procedure: EXPLORATORY LAPAROTOMY;  Surgeon: Evonnie Dorothyann LABOR, DO;  Location: [facility];  Service: General;  Laterality: N/A;   LYSIS OF ADHESION N/A 07/02/2021   Procedure: EXTENSIVE LYSIS OF ADHESIONS;  Surgeon: Evonnie Dorothyann LABOR, DO;  Location: [facility];  Service: General;  Laterality: N/A;   ORIF FEMUR FRACTURE Right 08/04/2012   Procedure: OPEN REDUCTION INTERNAL FIXATION (ORIF) DISTAL FEMUR FRACTURE;  Surgeon: Oneil JAYSON Herald, MD;  Location: [facility];  Service: Orthopedics;  Laterality: Right;  Right Retrograde Supracondylar Nail Right Femur     Prior to Admission medications  Medication Sig Start Date End Date Taking? Authorizing Provider  amoxicillin -clavulanate (AUGMENTIN ) 875-125 MG tablet Take 1 tablet by mouth every 12 (twelve) hours. 02/29/24   Donnajean Lynwood DEL, PA-C  clotrimazole -betamethasone  (LOTRISONE ) cream Apply 1 Application topically 2 (two) times daily. 01/19/23   McKenzie, Belvie CROME, MD  Docusate Sodium  (DSS) 100 MG CAPS Take 1 capsule by mouth 2 (two) times daily. 03/17/11   [provider]  ibuprofen  (ADVIL ) 600 MG tablet Take 1 tablet (600 mg total)  by mouth 3 (three) times daily. 04/13/23   Idol, Julie, PA-C  methadone  (DOLOPHINE ) 10 MG tablet Take 20 mg by mouth 3 (three) times daily.    [provider]  mirabegron  ER (MYRBETRIQ ) 50 MG TB24 tablet Take 1 tablet (50 mg total) by mouth daily. 04/06/23   McKenzie, Belvie CROME, MD  naloxone [facility]) nasal spray 4 mg/0.1 mL as directed. 04/10/21   [provider]  pregabalin  (LYRICA ) 75 MG capsule Take 75 mg by mouth 3 (three) times daily.     [provider]  tamsulosin  (FLOMAX ) 0.4 MG CAPS capsule Take 1 capsule (0.4 mg total) by mouth daily. 03/31/23   Larocco, Sarah C, FNP     Allergies: Patient has no known allergies.    Review of Systems  Gastrointestinal:  Positive for abdominal pain.    Updated Vital Signs BP 110/61   Pulse 85   Temp 98.4 F (36.9 C) (Oral)   Resp 16   Ht 5' 9 (1.753 m)   Wt 59 kg   SpO2 100%   BMI 19.20 kg/m   Physical Exam Vitals and nursing note reviewed.  Constitutional:      General: He is not in acute distress.    Appearance: He is well-developed.  HENT:     Head: Normocephalic and atraumatic.  Eyes:     Conjunctiva/sclera: Conjunctivae normal.  Cardiovascular:     Rate and Rhythm: Normal rate and regular rhythm.     Heart sounds: No murmur heard. Pulmonary:     Effort: Pulmonary effort is normal. No respiratory distress.     Breath sounds: Normal breath sounds.  Abdominal:     Palpations: Abdomen is soft.     Tenderness: There is no abdominal tenderness.     Comments: Significant right buttock pressure ulcer to bone without any purulence, surrounding erythema or warmth  Musculoskeletal:        General: No swelling.     Cervical back: Neck supple.  Skin:    General: Skin is warm and dry.     Capillary Refill: Capillary refill takes less than 2 seconds.  Neurological:     Mental Status: He is alert.  Psychiatric:        Mood and Affect: Mood normal.     (all labs ordered are listed, but only abnormal results are displayed) Labs Reviewed  LIPASE, BLOOD - Abnormal; Notable for the following components:      Result Value   Lipase <10 (*)    All other components within normal limits  COMPREHENSIVE METABOLIC PANEL WITH GFR - Abnormal; Notable for the following components:   Creatinine, Ser 0.56 (*)    AST 185 (*)    ALT 148 (*)    Alkaline Phosphatase 160 (*)    Total Bilirubin 3.4 (*)    All other components within normal limits  CBC - Abnormal; Notable for the following components:   WBC 15.3 (*)    Hemoglobin 9.9 (*)    HCT 32.0 (*)    MCV 71.6 (*)    MCH 22.1 (*)    RDW 17.6 (*)    All  other components within normal limits  URINALYSIS, ROUTINE W REFLEX MICROSCOPIC - Abnormal; Notable for the following components:   Color, Urine AMBER (*)    Hgb urine dipstick SMALL (*)    Leukocytes,Ua SMALL (*)    Bacteria, UA RARE (*)    All other components within normal limits  LACTIC ACID, PLASMA  LACTIC ACID,  PLASMA    EKG: None  Radiology: CT ABDOMEN PELVIS W CONTRAST Result Date: 02/29/2024 CLINICAL DATA:  Fever fatigue abdominal pain EXAM: CT ABDOMEN AND PELVIS WITH CONTRAST TECHNIQUE: Multidetector CT imaging of the abdomen and pelvis was performed using the standard protocol following bolus administration of intravenous contrast. RADIATION DOSE REDUCTION: This exam was performed according to the departmental dose-optimization program which includes automated exposure control, adjustment of the mA and/or kV according to patient size and/or use of iterative reconstruction technique. CONTRAST:  OMNIPAQUE  IOHEXOL  300 MG/ML  SOLN COMPARISON:  CT 05/14/2022, 06/27/2021 FINDINGS: Lower chest: Lung bases demonstrate no acute airspace disease. Hepatobiliary: No focal hepatic abnormality. Cholecystectomy. Stable common duct diameter up to 9 mm. No intra hepatic biliary dilatation Pancreas: Atrophic.  No inflammation or ductal dilatation Spleen: Normal in size without focal abnormality. Adrenals/Urinary Tract: Adrenal glands are unremarkable. Kidneys are normal, without renal calculi, focal lesion, or hydronephrosis. Bladder is unremarkable. Stomach/Bowel: Stomach within normal limits. No dilated small bowel. Suspicion of mild wall thickening of the ascending colon and hepatic flexure. Normal appendix. Chronic circumferential thickening of the anal rectal region. Vascular/Lymphatic: Aortic atherosclerosis. No enlarged abdominal or pelvic lymph nodes. Similar positioning of IVC filter. Reproductive: Negative prostate Other: No ascites or free air Musculoskeletal: Intramedullary rod in the  left femur. Large chronic right decubitus ulcer/wound involving the right buttock and perianal region. Cannot exclude right perianal fistula, series 2, image 82 and coronal series 4, image 101. Underlying chronic erosive and resorptive changes of the inferior pubic ramus. Linear infiltration of the subcutaneous soft tissues of the left gluteal region tracking towards the left inferior pubic ramus but this is also chronic. IMPRESSION: 1. Suspicion of mild wall thickening of the ascending colon and hepatic flexure, possible colitis. 2. Large chronic right decubitus ulcer/wound involving the right buttock and perianal region. Soft tissue thickening between the right anus and the chronic wound, fistula not excluded. Underlying chronic erosive and resorptive changes of the right inferior pubic ramus. 3. Chronic circumferential thickening of the anal rectal region, presumed inflammatory but correlate with direct visualization if not already performed. 4. Aortic atherosclerosis. Aortic Atherosclerosis (ICD10-I70.0). Electronically Signed   By: Luke Bun M.D.   On: 02/29/2024 18:43     Procedures   Medications Ordered in the ED  cefTRIAXone  (ROCEPHIN ) 1 g in sodium chloride  0.9 % 100 mL IVPB (1 g Intravenous New Bag/Given 02/29/24 2020)  iohexol  (OMNIPAQUE ) 300 MG/ML solution 100 mL (100 mLs Intravenous Contrast Given 02/29/24 1814)    Clinical Course as of 02/29/24 2022  Wed Feb 29, 2024  1743 Patient with history of paraplegia and neurogenic bladder presents with concerns for UTI, abdominal pain and worsening pain in his chronic pressure ulcer.  Upon arrival patient is hemodynamically stable and nontoxic-appearing his pressure ulcer appears to be healing without any active infection.  His abdomen is without significant abdominal tenderness. [JT]  1816 CBC(!) Leukocytosis 15.3 with stable hemoglobin [JT]  1816 Lipase, blood(!) Within normal limits [JT]  1816 Comprehensive metabolic panel(!) Noted  transaminitis with bili 3.4 [JT]  1816 Urinalysis, Routine w reflex microscopic -Urine, Catheterized(!) Small leukocytes with negative nitrites, 6-10 WBCs and rare bacteria [JT]  1913 CT ABDOMEN PELVIS W CONTRAST Mild wall thickening possible colitis, large chronic right decubitus, no acute findings [JT]  2002 Workup is overall most notable for leukocytosis with a urinalysis.  Equivocal for UTI.  He states that his current symptoms are identical to his frequent UTIs and he would like to  be treated.  He states that he is always treated with Augmentin  and this clears his symptoms.  His sensitivity is consistent with this.  Will send in culture.  Additionally provided referral for wound care for pressure ulcer [JT]    Clinical Course User Index [JT] Donnajean Lynwood DEL, PA-C                                 Medical Decision Making Amount and/or Complexity of Data Reviewed Labs: ordered. Decision-making details documented in ED Course. Radiology: ordered. Decision-making details documented in ED Course.  Risk Prescription drug management.   This patient presents to the ED with chief complaint(s) of abdominal pain.  The complaint involves an extensive differential diagnosis and also carries with it a high risk of complications and morbidity.   Pertinent past medical history as listed in HPI  The differential diagnosis includes  UTI, pyelonephritis, nephrolithiasis, cellulitis, fistula, SBO Additional history obtained: Records reviewed Care Everywhere/External Records  Disposition:   Patient will be discharged home. The patient has been appropriately medically screened and/or stabilized in the ED. I have low suspicion for any other emergent medical condition which would require further screening, evaluation or treatment in the ED or require inpatient management. At time of discharge the patient is hemodynamically stable and in no acute distress. I have discussed work-up results and diagnosis  with patient and answered all questions. Patient is agreeable with discharge plan. We discussed strict return precautions for returning to the emergency department and they verbalized understanding.     Social Determinants of Health:   none  This note was dictated with voice recognition software.  Despite best efforts at proofreading, errors may have occurred which can change the documentation meaning.       Final diagnoses:  Dysuria    ED Discharge Orders          Ordered    amoxicillin -clavulanate (AUGMENTIN ) 875-125 MG tablet  Every 12 hours        02/29/24 2012               Donnajean Lynwood DEL DEVONNA 02/29/24 2022    Elnor Savant A, DO 03/05/24 1549  "

## 2024-02-29 NOTE — ED Notes (Signed)
 Patient transported to CT

## 2024-03-02 ENCOUNTER — Institutional Professional Consult (permissible substitution) (INDEPENDENT_AMBULATORY_CARE_PROVIDER_SITE_OTHER)
